# Patient Record
Sex: Female | Born: 1980 | Race: Black or African American | Hispanic: No | Marital: Single | Smoking: Current some day smoker
Health system: Southern US, Community
[De-identification: ages and names within clinical notes are randomized; demographics above are authoritative.]

## PROBLEM LIST (undated history)

## (undated) DIAGNOSIS — F419 Anxiety disorder, unspecified: Secondary | ICD-10-CM

## (undated) DIAGNOSIS — F32A Depression, unspecified: Secondary | ICD-10-CM

## (undated) DIAGNOSIS — K219 Gastro-esophageal reflux disease without esophagitis: Secondary | ICD-10-CM

## (undated) DIAGNOSIS — E041 Nontoxic single thyroid nodule: Secondary | ICD-10-CM

## (undated) HISTORY — DX: Nontoxic single thyroid nodule: E04.1

## (undated) HISTORY — DX: Anxiety disorder, unspecified: F41.9

## (undated) HISTORY — DX: Gastro-esophageal reflux disease without esophagitis: K21.9

## (undated) HISTORY — PX: TUBAL LIGATION: SHX77

## (undated) HISTORY — DX: Depression, unspecified: F32.A

## (undated) HISTORY — PX: ABDOMINAL HYSTERECTOMY: SHX81

---

## 2010-01-20 ENCOUNTER — Emergency Department (HOSPITAL_BASED_OUTPATIENT_CLINIC_OR_DEPARTMENT_OTHER): Admission: EM | Admit: 2010-01-20 | Discharge: 2010-01-20 | Payer: Self-pay | Admitting: Emergency Medicine

## 2010-06-18 ENCOUNTER — Emergency Department (HOSPITAL_BASED_OUTPATIENT_CLINIC_OR_DEPARTMENT_OTHER)
Admission: EM | Admit: 2010-06-18 | Discharge: 2010-06-19 | Payer: Self-pay | Source: Home / Self Care | Admitting: Emergency Medicine

## 2010-09-04 ENCOUNTER — Emergency Department (HOSPITAL_BASED_OUTPATIENT_CLINIC_OR_DEPARTMENT_OTHER)
Admission: EM | Admit: 2010-09-04 | Discharge: 2010-09-04 | Disposition: A | Discharge status: Home / Self Care | Payer: Self-pay | Attending: Emergency Medicine | Admitting: Emergency Medicine

## 2010-09-04 DIAGNOSIS — H5789 Other specified disorders of eye and adnexa: Secondary | ICD-10-CM | POA: Insufficient documentation

## 2010-09-04 DIAGNOSIS — H00019 Hordeolum externum unspecified eye, unspecified eyelid: Secondary | ICD-10-CM | POA: Insufficient documentation

## 2010-11-14 ENCOUNTER — Emergency Department (HOSPITAL_BASED_OUTPATIENT_CLINIC_OR_DEPARTMENT_OTHER)
Admission: EM | Admit: 2010-11-14 | Discharge: 2010-11-14 | Disposition: A | Discharge status: Home / Self Care | Payer: Self-pay | Attending: Emergency Medicine | Admitting: Emergency Medicine

## 2010-11-14 DIAGNOSIS — R221 Localized swelling, mass and lump, neck: Secondary | ICD-10-CM | POA: Insufficient documentation

## 2010-11-14 DIAGNOSIS — R220 Localized swelling, mass and lump, head: Secondary | ICD-10-CM | POA: Insufficient documentation

## 2010-11-14 DIAGNOSIS — R599 Enlarged lymph nodes, unspecified: Secondary | ICD-10-CM | POA: Insufficient documentation

## 2010-11-14 DIAGNOSIS — L089 Local infection of the skin and subcutaneous tissue, unspecified: Secondary | ICD-10-CM | POA: Insufficient documentation

## 2012-04-28 ENCOUNTER — Emergency Department (HOSPITAL_BASED_OUTPATIENT_CLINIC_OR_DEPARTMENT_OTHER)
Admission: EM | Admit: 2012-04-28 | Discharge: 2012-04-28 | Disposition: A | Discharge status: Home / Self Care | Payer: Medicaid Other | Attending: Emergency Medicine | Admitting: Emergency Medicine

## 2012-04-28 ENCOUNTER — Encounter (HOSPITAL_BASED_OUTPATIENT_CLINIC_OR_DEPARTMENT_OTHER): Payer: Self-pay | Admitting: *Deleted

## 2012-04-28 DIAGNOSIS — L259 Unspecified contact dermatitis, unspecified cause: Secondary | ICD-10-CM | POA: Insufficient documentation

## 2012-04-28 MED ORDER — TRIAMCINOLONE ACETONIDE 0.025 % EX OINT: TOPICAL_OINTMENT | Freq: Two times a day (BID) | CUTANEOUS | Status: DC

## 2012-04-28 NOTE — ED Notes (Signed)
Family at bedside., pt requested something to drink, ginger ale given per Dr. Fredderick Phenix.

## 2012-04-28 NOTE — ED Notes (Signed)
Pt has small patches of blisters to left arm, chest and left side face.

## 2012-04-28 NOTE — ED Provider Notes (Signed)
History   This chart was scribed for Rolan Bucco, MD by Thad Ranger, ED Scribe. This patient was seen in room MH11/MH11 and the patient's care was started at 7:38 PM.    CSN: [id number]  Arrival date & time 04/28/12  1755   None     Chief Complaint  Patient presents with  . Rash   The history is provided by the patient. No language interpreter was used.    Melissa Blankenship is a 31 y.o. female who presents to the Emergency Department complaining of constant, localized rash and chestwall onset 4 days ago. There is associated constant itching, and soreness; but she denies them being painful. She also says that her left side of her face felt a little swollen today. Patient reports taking benadryl with some relief. She denies being in contact with anything strange or poison ivy. She has no history of allergies. She denies fever, chills, nausea, and vomiting.    History reviewed. No pertinent past medical history.  History reviewed. No pertinent past surgical history.  History reviewed. No pertinent family history.  History  Substance Use Topics  . Smoking status: Never Smoker   . Smokeless tobacco: Not on file  . Alcohol Use: No   No OB history provided.   Review of Systems  Constitutional: Negative for fever, chills, diaphoresis and fatigue.  HENT: Negative for congestion, rhinorrhea and sneezing.   Eyes: Negative.   Respiratory: Negative for cough, chest tightness and shortness of breath.   Cardiovascular: Negative for chest pain and leg swelling.  Gastrointestinal: Negative for nausea, vomiting, abdominal pain, diarrhea and blood in stool.  Genitourinary: Negative for frequency, hematuria, flank pain and difficulty urinating.  Musculoskeletal: Negative for back pain and arthralgias.  Skin: Positive for color change and rash.  Neurological: Negative for dizziness, speech difficulty, weakness, numbness and headaches.    Allergies  Review of patient's allergies  indicates no known allergies.  Home Medications   Current Outpatient Rx  Name  Route  Sig  Dispense  Refill  . TRIAMCINOLONE ACETONIDE 0.025 % EX OINT   Topical   Apply topically 2 (two) times daily.   30 g   0     BP 106/56  Pulse 72  Temp 98.8 F (37.1 C) (Oral)  Resp 18  Ht 5\' 3"  (1.6 m)  Wt 114 lb (51.71 kg)  BMI 20.19 kg/m2  SpO2 100%  LMP 04/29/2011  Physical Exam  Constitutional: She is oriented to person, place, and time. She appears well-developed and well-nourished.  HENT:  Head: Normocephalic and atraumatic.       No obvious facial/lip/tongue swelling  Eyes: Pupils are equal, round, and reactive to light.  Neck: Normal range of motion. Neck supple.  Cardiovascular: Normal rate, regular rhythm and normal heart sounds.   Pulmonary/Chest: Effort normal and breath sounds normal. No respiratory distress. She has no wheezes. She has no rales. She exhibits no tenderness.  Abdominal: Soft. Bowel sounds are normal. There is no tenderness. There is no rebound and no guarding.  Musculoskeletal: Normal range of motion. She exhibits no edema.  Lymphadenopathy:    She has no cervical adenopathy.  Neurological: She is alert and oriented to person, place, and time.  Skin: Skin is warm and dry. No rash noted.       Small raised erythematous area to her chest wall and her left upper arm its blanching no petechiae or purpura.  No vesicles.     Psychiatric: She has a  normal mood and affect.    ED Course  Procedures (including critical care time)  DIAGNOSTIC STUDIES: Oxygen Saturation is 100% on room air, normal by my interpretation.    COORDINATION OF CARE: 8:56 PM Discussed treatment plan with pt at bedside and pt agreed to plan.  Labs Reviewed - No data to display No results found.   1. Contact dermatitis       MDM  Pt with what appears to be a localized contact dermatitis.  Will give triamcinalone cream.  Continue benadryl as needed.  F/u with her PMD in [place] if symptoms not improving      I personally performed the services described in this documentation, which was scribed in my presence.  The recorded information has been reviewed and considered.    Rolan Bucco, MD 04/28/12 2116

## 2012-04-28 NOTE — ED Notes (Signed)
MD at bedside. 

## 2016-11-06 ENCOUNTER — Emergency Department (HOSPITAL_COMMUNITY)
Admission: EM | Admit: 2016-11-06 | Discharge: 2016-11-06 | Disposition: A | Discharge status: Home / Self Care | Payer: Medicaid Other | Attending: Emergency Medicine | Admitting: Emergency Medicine

## 2016-11-06 ENCOUNTER — Encounter (HOSPITAL_COMMUNITY): Payer: Self-pay | Admitting: Emergency Medicine

## 2016-11-06 ENCOUNTER — Emergency Department (HOSPITAL_COMMUNITY): Payer: Medicaid Other

## 2016-11-06 DIAGNOSIS — Y999 Unspecified external cause status: Secondary | ICD-10-CM | POA: Insufficient documentation

## 2016-11-06 DIAGNOSIS — Y929 Unspecified place or not applicable: Secondary | ICD-10-CM | POA: Diagnosis not present

## 2016-11-06 DIAGNOSIS — M79644 Pain in right finger(s): Secondary | ICD-10-CM

## 2016-11-06 DIAGNOSIS — W231XXA Caught, crushed, jammed, or pinched between stationary objects, initial encounter: Secondary | ICD-10-CM | POA: Insufficient documentation

## 2016-11-06 DIAGNOSIS — Y939 Activity, unspecified: Secondary | ICD-10-CM | POA: Insufficient documentation

## 2016-11-06 DIAGNOSIS — S6991XA Unspecified injury of right wrist, hand and finger(s), initial encounter: Secondary | ICD-10-CM | POA: Diagnosis present

## 2016-11-06 IMAGING — CR DG FINGER MIDDLE 2+V*R*
3 series · 3 of 3 positions shown · non-contrast
Comparison: None.

CLINICAL DATA: 35-year-old female with trauma to the right middle
finger.

EXAM:
RIGHT MIDDLE FINGER 2+V

[x finger pa right]
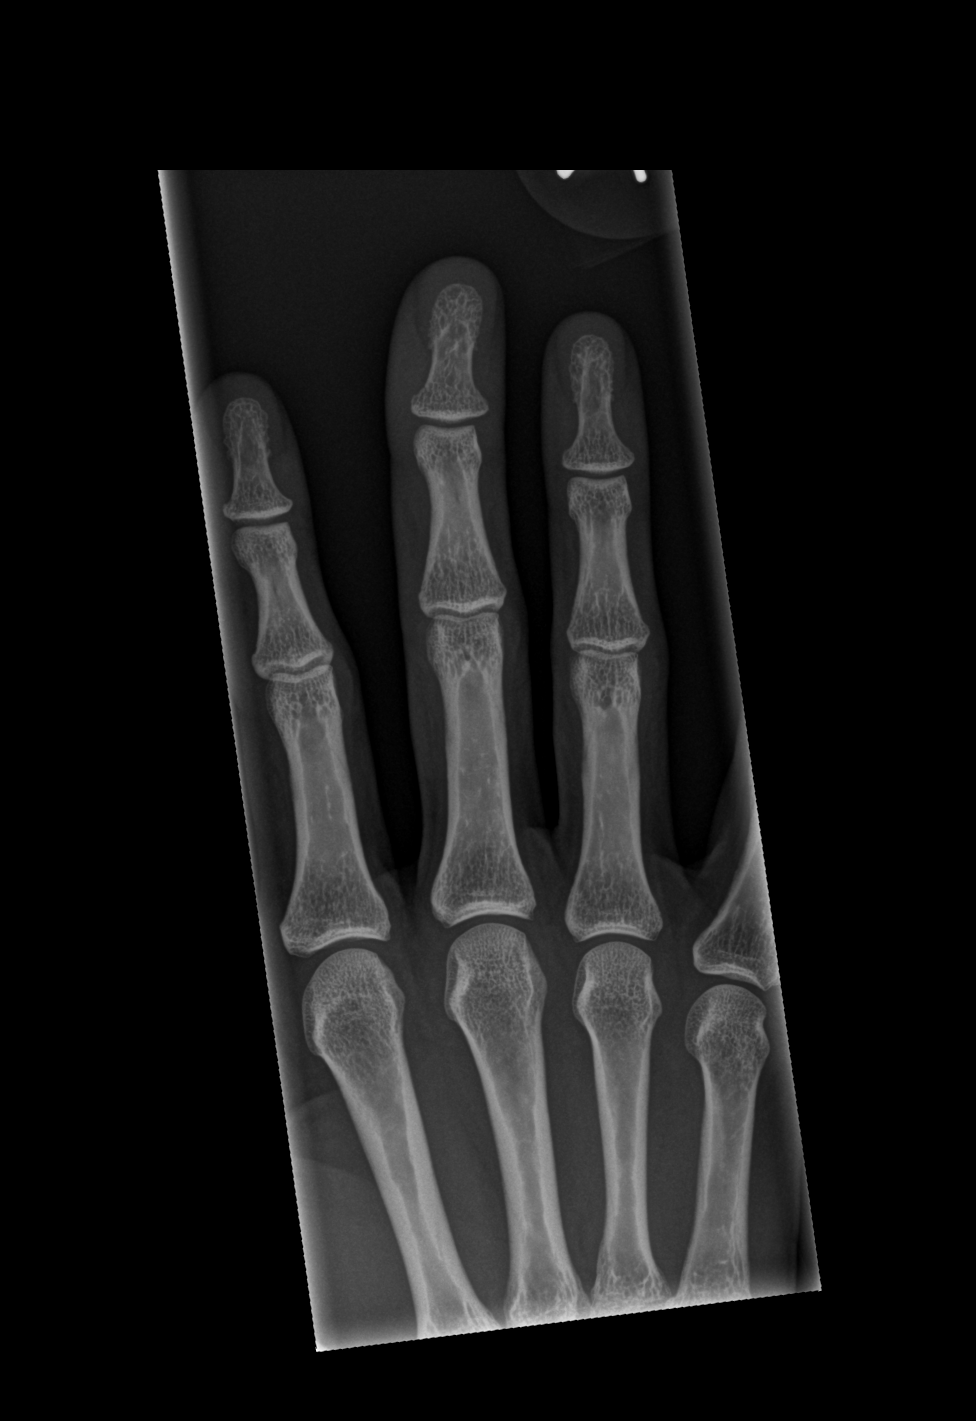

[x finger obl right]
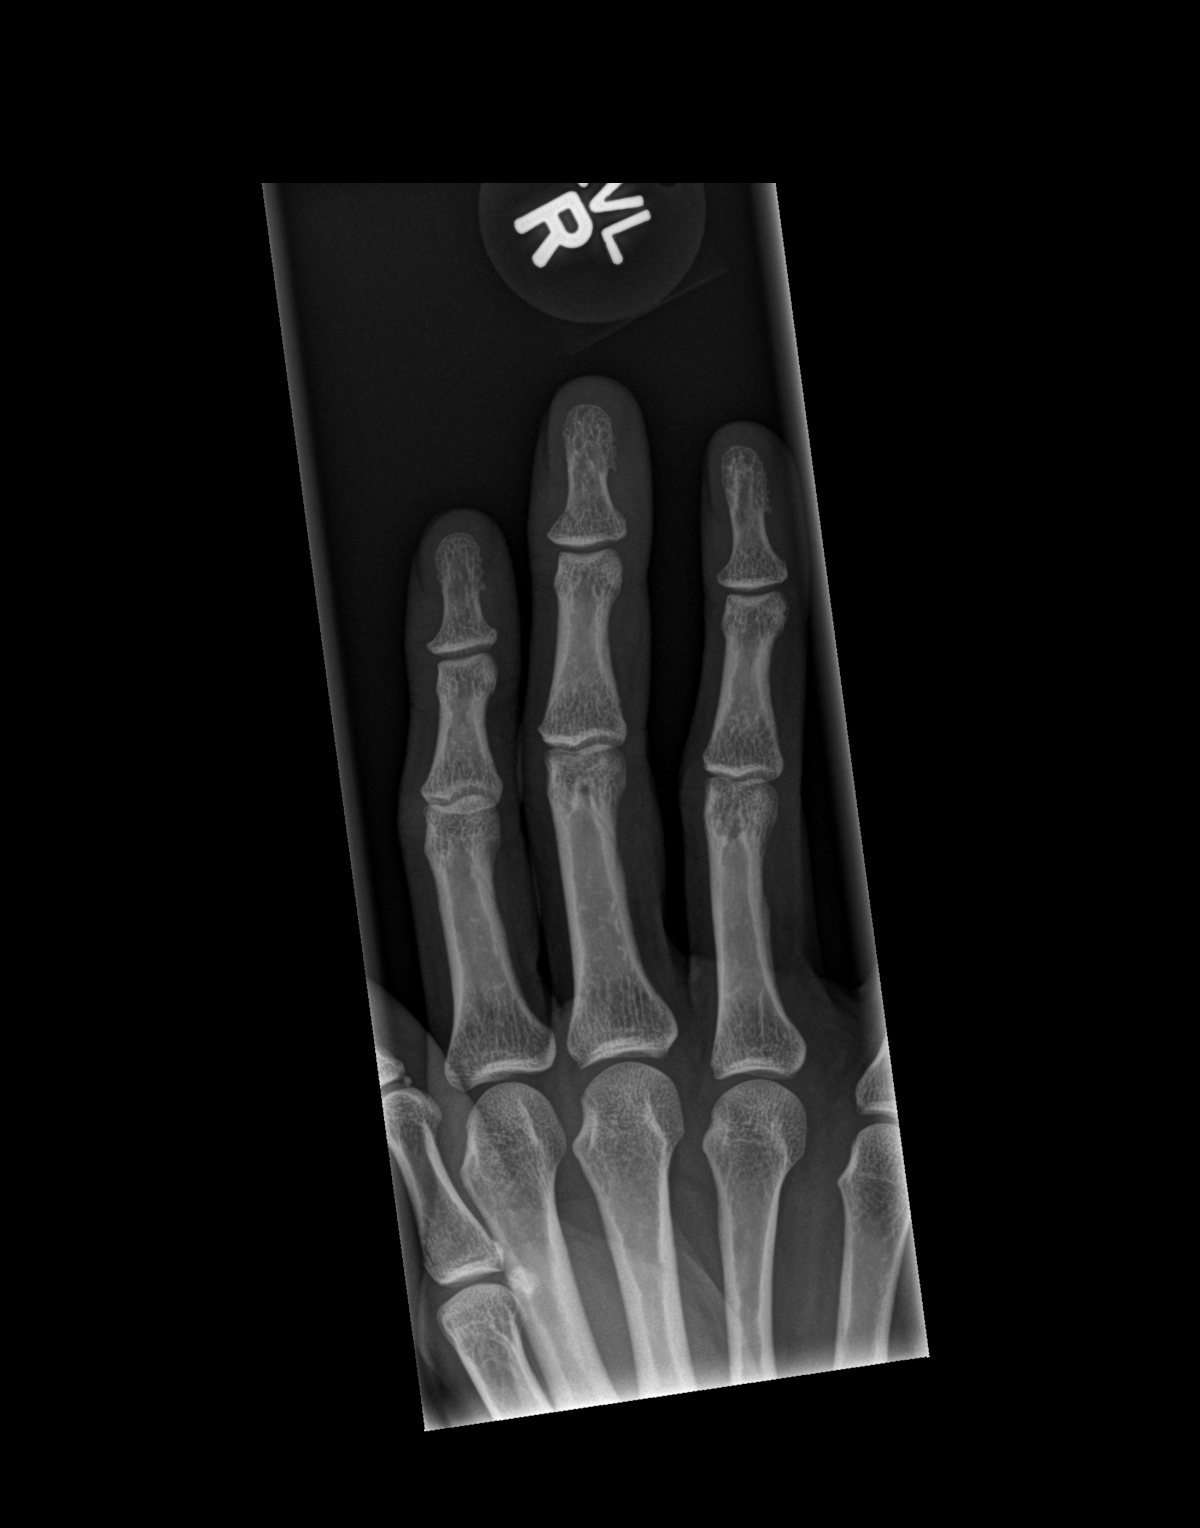

[x finger lat right]
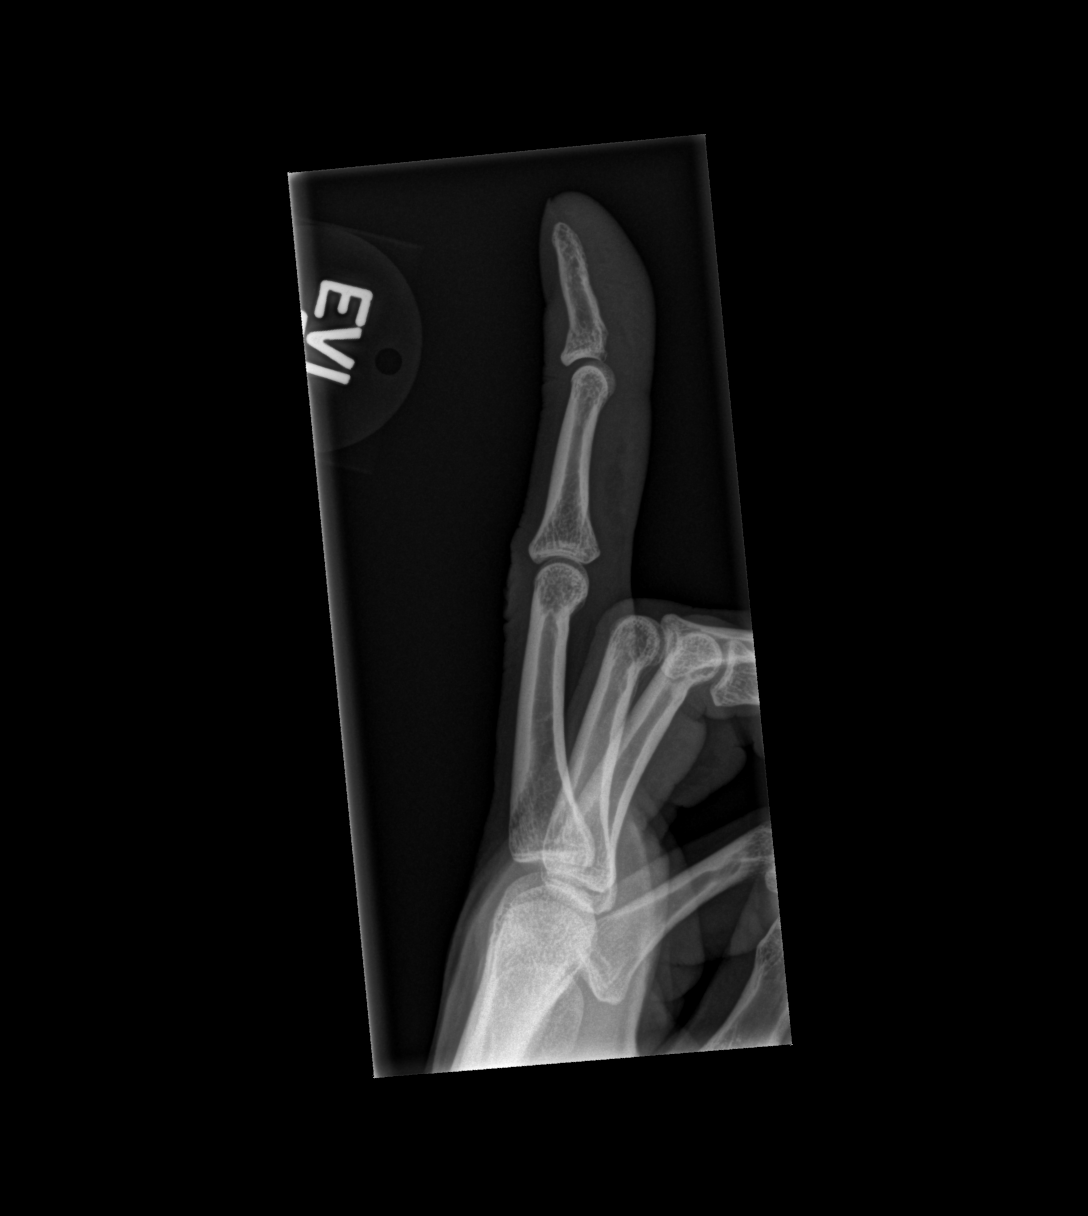

[3 of 3 positions shown; findings below may reference images not displayed]

FINDINGS: There is a linear lucency involving the base of the distal phalanx
of the third digit with extension to the radial cortex. This likely
represents a vascular groove and less likely a nondisplaced
fracture. Clinical correlation is recommended. No definite acute
fracture identified. There is no dislocation. The bones are well
mineralized. No arthritic changes. Mild soft tissue swelling of the
distal digit. No radiopaque foreign object.
IMPRESSION: No definite acute fracture or dislocation. Faint linear lucency at
the base of the distal phalanx of the third digit most likely
represents a vascular groove.

## 2016-11-06 MED ORDER — ACETAMINOPHEN 500 MG PO TABS
1000.0000 mg | ORAL_TABLET | Freq: Once | ORAL | Status: AC
  Administered 2016-11-06: 1000 mg via ORAL
  Filled 2016-11-06: qty 2

## 2016-11-06 NOTE — ED Provider Notes (Signed)
[facility]-EMERGENCY DEPT Provider Note   CSN: [id number] Arrival date & time: 11/06/16  1910  By signing my name below, I, Modena Jansky, attest that this documentation has been prepared under the direction and in the presence of non-physician practitioner, Maxwell Caul, PA-C. Electronically Signed: Modena Jansky, Scribe. 11/06/2016. 8:00 PM.  History   Chief Complaint Chief Complaint  Patient presents with  . Hand Pain   The history is provided by the patient. No language interpreter was used.   HPI Comments: Melissa Blankenship is a 36 y.o. female who presents to the Emergency Department complaining of constant moderate 3rd finger pain that started about 5 hours ago. 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 She iced her finger  with no medications taken PTA. Her gradually worsening pain is exacerbated by any 3rd finger movement. She reports associated redness. Denies any numbness/weakness other complaints at this time.   PCP: Neldon Labella, PA   History reviewed. No pertinent past medical history.  There are no active problems to display for this patient.   Past Surgical History:  Procedure Laterality Date  . ABDOMINAL HYSTERECTOMY    . TUBAL LIGATION      OB History    No data available       Home Medications    Prior to Admission medications   Medication Sig Start Date End Date Taking? Authorizing Provider  triamcinolone (KENALOG) 0.025 % ointment Apply topically 2 (two) times daily. 04/28/12   Rolan Bucco, MD    Family History No family history on file.  Social History Social History  Substance Use Topics  . Smoking status: Never Smoker  . Smokeless tobacco: Never Used  . Alcohol use No     Allergies   Patient has no known allergies.   Review of Systems Review of Systems  Constitutional: Negative for fever.  Musculoskeletal: Positive for arthralgias, joint swelling and myalgias.  Skin: Positive for color  change.     Physical Exam Updated Vital Signs BP (!) 136/93 (BP Location: Left Arm)   Pulse 71   Temp 98 F (36.7 C) (Oral)   Resp 18   Ht 5\' 3"  (1.6 m)   Wt 126 lb (57.2 kg)   LMP 04/29/2011   SpO2 98%   BMI 22.32 kg/m   Physical Exam  Constitutional: She appears well-developed and well-nourished.  Sitting comfortably on examination table  HENT:  Head: Normocephalic and atraumatic.  Eyes: Conjunctivae and EOM are normal. Right eye exhibits no discharge. Left eye exhibits no discharge. No scleral icterus.  Cardiovascular:  +2 radial pulses.   Pulmonary/Chest: Effort normal.  Musculoskeletal:  Full ROM of right wrist. No TTP to right wrist or right MCPs. Right 3rd digit with diffuse TTP with overlying ecchymosis and soft tissue swelling to the distal end. PIP flexion intact fully.  DIP flexion is limited secondary to pain and swelling but she is able to achieve mild flexion when held in isolation.    Neurological: She is alert.  Skin: Skin is warm and dry.  Psychiatric: She has a normal mood and affect. Her speech is normal and behavior is normal.  Nursing note and vitals reviewed.    ED Treatments / Results  DIAGNOSTIC STUDIES: Oxygen Saturation is 98% on RA, normal by my interpretation.    COORDINATION OF CARE: 8:04 PM- Pt advised of plan for treatment and pt agrees.  Labs (all labs ordered are listed, but only abnormal results are  displayed) Labs Reviewed - No data to display  EKG  EKG Interpretation None       Radiology Dg Finger Middle Right  Result Date: 11/06/2016 CLINICAL DATA:  36 year old female with trauma to the right middle finger. EXAM: RIGHT MIDDLE FINGER 2+V COMPARISON:  None. FINDINGS: There is a linear lucency involving the base of the distal phalanx of the third digit with extension to the radial cortex. This likely represents a vascular groove and less likely a nondisplaced fracture. Clinical correlation is recommended. No definite acute  fracture identified. There is no dislocation. The bones are well mineralized. No arthritic changes. Mild soft tissue swelling of the distal digit. No radiopaque foreign object. IMPRESSION: No definite acute fracture or dislocation. Faint linear lucency at the base of the distal phalanx of the third digit most likely represents a vascular groove. Electronically Signed   By: Elgie CollardArash  Radparvar M.D.   On: 11/06/2016 19:53    Procedures Procedures (including critical care time)  Medications Ordered in ED Medications  acetaminophen (TYLENOL) tablet 1,000 mg (1,000 mg Oral Given 11/06/16 2013)     Initial Impression / Assessment and Plan / ED Course  I have reviewed the triage vital signs and the nursing notes.  Pertinent labs & imaging results that were available during my care of the patient were reviewed by me and considered in my medical decision making (see chart for details).     36 year old female who presents with right third finger pain that began after a injury this afternoon. Patient is neurovascularly intact. She does have some diffuse ecchymosis and swelling to the distal end. DIP flexion limited secondary to pain and swelling but is able to have some flexion when held in isolation. X-rays ordered at triage. Analgesics given in the department.  Patient X-Ray negative for obvious fracture or dislocation. They do note the presence of a linear lucency to the distal end of the finger. Pt advised to follow up with orthopedics if symptoms persist for possibility of missed fracture diagnosis. Patient given splint while in ED, conservative therapy recommended and discussed. Instructed patient to follow-up with referred hand in 2 days if no improvement in symptoms. Return precautions discussed. Patient expresses understanding and agreement to plan.    Final Clinical Impressions(s) / ED Diagnoses   Final diagnoses:  Finger pain, right    New Prescriptions Discharge Medication List as of  11/06/2016  8:09 PM    I personally performed the services described in this documentation, which was scribed in my presence. The recorded information has been reviewed and is accurate.     Maxwell CaulLayden, Lindsey A, PA-C 11/06/16 2100    Arby BarrettePfeiffer, Marcy, MD 11/10/16 [phone]

## 2016-11-06 NOTE — ED Notes (Signed)
Ortho en route.  

## 2016-11-06 NOTE — ED Triage Notes (Signed)
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  Some swelling and color changes noted to first joint on affected finger.  Pt states she is unable to bend it.

## 2016-11-06 NOTE — Discharge Instructions (Signed)
Follow-up with her primary care doctor in the next 24-48 hours for further evaluation.  Follow-up with referred him Dr. in the next 2-4 days if no improvement in symptoms.  Take Tylenol or ibuprofen as needed for pain.  Use the splint for support and stabilization.  Return the emergency Department for any worsening pain, worsening swelling, redness or swelling that extends down her fingertips or hand, fever, worsening concerns or symptoms.

## 2016-11-06 NOTE — Progress Notes (Signed)
Orthopedic Tech Progress Note Patient Details:  Berneice GandyCherise N Melissa Blankenship 03/02/1981 [id number]  Ortho Devices Type of Ortho Device: Finger splint Ortho Device/Splint Location: Rt Middle finger Ortho Device/Splint Interventions: Application   Clois Dupesvery S Weslynn Ke 11/06/2016, 8:22 PM

## 2017-04-06 ENCOUNTER — Other Ambulatory Visit: Payer: Self-pay | Admitting: Physician Assistant

## 2017-04-06 DIAGNOSIS — Z139 Encounter for screening, unspecified: Secondary | ICD-10-CM

## 2017-05-08 ENCOUNTER — Ambulatory Visit
Admission: RE | Admit: 2017-05-08 | Discharge: 2017-05-08 | Disposition: A | Discharge status: Home / Self Care | Payer: Medicaid Other | Source: Ambulatory Visit | Attending: Physician Assistant | Admitting: Physician Assistant

## 2017-05-08 DIAGNOSIS — Z139 Encounter for screening, unspecified: Secondary | ICD-10-CM

## 2017-05-09 ENCOUNTER — Other Ambulatory Visit: Payer: Self-pay | Admitting: Physician Assistant

## 2017-05-09 DIAGNOSIS — N63 Unspecified lump in unspecified breast: Secondary | ICD-10-CM

## 2017-06-12 HISTORY — PX: INCISION AND DRAINAGE / EXCISION THYROGLOSSAL CYST: SUR667

## 2018-12-09 ENCOUNTER — Ambulatory Visit (INDEPENDENT_AMBULATORY_CARE_PROVIDER_SITE_OTHER): Payer: No Typology Code available for payment source | Admitting: Internal Medicine

## 2018-12-09 ENCOUNTER — Encounter: Payer: Self-pay | Admitting: Internal Medicine

## 2018-12-09 ENCOUNTER — Other Ambulatory Visit: Payer: Self-pay

## 2018-12-09 VITALS — BP 119/78 | HR 90 | Temp 98.1°F | Wt 131.5 lb

## 2018-12-09 DIAGNOSIS — E041 Nontoxic single thyroid nodule: Secondary | ICD-10-CM

## 2018-12-09 DIAGNOSIS — K259 Gastric ulcer, unspecified as acute or chronic, without hemorrhage or perforation: Secondary | ICD-10-CM

## 2018-12-09 DIAGNOSIS — M79673 Pain in unspecified foot: Secondary | ICD-10-CM

## 2018-12-09 DIAGNOSIS — K219 Gastro-esophageal reflux disease without esophagitis: Secondary | ICD-10-CM

## 2018-12-09 MED ORDER — OMEPRAZOLE 40 MG PO CPDR: 40.0000 mg | DELAYED_RELEASE_CAPSULE | Freq: Every day | ORAL | 0 refills | Status: DC

## 2018-12-09 MED FILL — OMEPRAZOLE DR 40 MG CAPSULE: 40 | 30 days supply | Qty: 30 | Fill #0

## 2018-12-09 NOTE — Progress Notes (Signed)
CC: dysphagia   HPI:  [name] is a 38 y.o. female with history thyroid nodules and gastric ulcers who presents to establish care and with an acute complaint of intermittent dysphagia for the last 4 months.  This is similar to her symptoms a year ago. She underwent evaluation by ENT and endocrinology. 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 She was being followed by endocrinology for two thyroid nodules. Her thyroid function testing at that time was normal, so they recommended repeat ultrasound in 6-12 months to monitor nodules. Unfortunately, she was lost to follow-up due to insurance issues.  Regarding the dysphagia, it seems to happen intermittently with solids or liquids. She describes it as the sensation of something getting stuck for a few seconds before going all the way down. Denies odynophagia or shortness of breath. Endorses fatigue, intermittent palpitations, hair loss. Weight tends to fluctuate, but denies significant weight loss since symptom onset.   Patient also reports symptoms of acid reflux, nausea, upper abdominal burning with eating. She has been seen by GI in the past and underwent upper and lower endoscopic evaluation. Diagnosed with gastric ulcers and initiated on Omeprazole with plan to repeat upper endoscopy for surveillance. However, she was unable to follow-up due to similar insurance and financial difficulties. She has not been able to take the Omeprazole since February.     Past Medical History:  Diagnosis Date  . GERD (gastroesophageal reflux disease)   . Thyroid nodule    Family History  Problem Relation Age of Onset  . Lung cancer Mother   . Diabetes Mellitus II Mother   . Hypertension Mother   . Diabetes type II Father   . Hypertension Father   . CAD Maternal Grandfather    Social: former smoker, denies EtOH or illicit drug use.  Works for [employer].   Review of Systems: Review of Systems  All other systems reviewed and are negative.   Physical Exam:  Vitals:   12/09/18 1014  BP: 119/78  Pulse: 90  Temp: 98.1 F (36.7 C)  TempSrc: Oral  SpO2: 100%  Weight: 131 lb 8 oz (59.6 kg)   Physical Exam Constitutional:      General: She is not in acute distress.    Appearance: Normal appearance.  Eyes:     Conjunctiva/sclera: Conjunctivae normal.  Neck:     Thyroid: Thyromegaly present.     Comments: Palpable nodule on right.  Cardiovascular:     Rate and Rhythm: Normal rate and regular rhythm.  Pulmonary:     Effort: Pulmonary effort is normal.     Breath sounds: Normal breath sounds.  Abdominal:     General: Bowel sounds are normal.     Palpations: Abdomen is soft.     Tenderness: There is no abdominal tenderness.  Musculoskeletal: Normal range of motion.     Right lower leg: No edema.     Left lower leg: No edema.  Skin:    General: Skin is warm and dry.  Neurological:     General: No focal deficit present.     Mental Status: She is alert and oriented to person, place, and time.  Psychiatric:        Mood and Affect: Mood normal.        Behavior: Behavior normal.     Assessment & Plan:   See Encounters Tab for problem based charting.  Patient  discussed with Dr. Rebeca Alert

## 2018-12-09 NOTE — Patient Instructions (Signed)
Melissa Blankenship, It was a pleasure meeting you! We are happy to have you establishing in our clinic.  Today we discussed:  1. Your difficulty swallowing: We want to evaluate if your symptoms are due to the thyroid nodules you have. I am ordering some labs and an ultrasound. I'm also placing a referral to endocrinology to re-establish care with them.   2. Acid reflux and ulcers: I'm starting you back on Omeprazole to take daily. We'll see how your symptom are doing in about 4 weeks and will consider GI referral at that time.   3. Your foot pain: likely inflammation of bones and tendons of your big toe. Continue taking Aleve as needed. Try to find a ball that you can roll out the bottom of your feet with at night when you get off from work. Icing and elevating when you're off work should also help.   We'll plan to see you back in about 4 weeks, but please call sooner if you think your symptoms of difficulty swallowing are worsening or you notice any difficulty breathing.   Take care! Dr. Koleen Distance

## 2018-12-10 LAB — BMP8+ANION GAP
Anion Gap: 15 mmol/L (ref 10.0–18.0)
BUN/Creatinine Ratio: 5 — ABNORMAL LOW (ref 9–23)
BUN: 4 mg/dL — ABNORMAL LOW (ref 6–20)
CO2: 20 mmol/L (ref 20–29)
Calcium: 9.2 mg/dL (ref 8.7–10.2)
Chloride: 106 mmol/L (ref 96–106)
Creatinine, Ser: 0.76 mg/dL (ref 0.57–1.00)
GFR calc Af Amer: 114 mL/min/{1.73_m2} (ref >59)
GFR calc non Af Amer: 99 mL/min/{1.73_m2} (ref >59)
Glucose: 82 mg/dL (ref 65–99)
Potassium: 4 mmol/L (ref 3.5–5.2)
Sodium: 141 mmol/L (ref 134–144)

## 2018-12-10 LAB — T4, FREE: Free T4: 1.14 ng/dL (ref 0.82–1.77)

## 2018-12-10 LAB — TSH: TSH: 0.466 u[IU]/mL (ref 0.450–4.500)

## 2018-12-10 LAB — T3: T3, Total: 164 ng/dL (ref 71–180)

## 2018-12-12 ENCOUNTER — Encounter: Payer: Self-pay | Admitting: Internal Medicine

## 2018-12-12 DIAGNOSIS — E041 Nontoxic single thyroid nodule: Secondary | ICD-10-CM | POA: Insufficient documentation

## 2018-12-12 DIAGNOSIS — K219 Gastro-esophageal reflux disease without esophagitis: Secondary | ICD-10-CM | POA: Insufficient documentation

## 2018-12-12 NOTE — Assessment & Plan Note (Signed)
Patient with known history of bilateral thyroid nodules, R>L. Last seen by endocrinology in 10/2017 at which time they recommended surveillance with ultrasound every 6-12 months. Thyroid function tests at that time were normal.  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  Repeat thyroid function testing today is normal. Will order ultrasound of the neck and place referral to endocrinology.

## 2018-12-12 NOTE — Assessment & Plan Note (Signed)
Patient endorses history of nausea, acid reflux, and upper abdominal burning with eating. States she has been evaluated by GI and was diagnosed with gastric ulcers. She was placed on Omeprazole with plan for repeat EGD for surveillance after several weeks of treatment. Will attempt to obtain these outside records, as they are not in our system. She has not been on Omeprazole since February. Will restart her on Omeprazole 40 mg daily and evaluate for symptom improvement at follow-up visit in 4 weeks. May need to be referred to GI if she does not respond to therapy.

## 2018-12-12 NOTE — Progress Notes (Signed)
Internal Medicine Clinic Attending  Case discussed with Dr. Bloomfield at the time of the visit.  We reviewed the resident's history and exam and pertinent patient test results.  I agree with the assessment, diagnosis, and plan of care documented in the resident's note.  Alexander Raines, M.D., Ph.D.  

## 2018-12-18 ENCOUNTER — Other Ambulatory Visit: Payer: Self-pay

## 2018-12-18 ENCOUNTER — Ambulatory Visit (HOSPITAL_COMMUNITY)
Admission: RE | Admit: 2018-12-18 | Discharge: 2018-12-18 | Disposition: A | Discharge status: Home / Self Care | Payer: No Typology Code available for payment source | Source: Ambulatory Visit | Attending: Internal Medicine | Admitting: Internal Medicine

## 2018-12-18 ENCOUNTER — Telehealth: Payer: Self-pay | Admitting: *Deleted

## 2018-12-18 DIAGNOSIS — E041 Nontoxic single thyroid nodule: Secondary | ICD-10-CM | POA: Insufficient documentation

## 2018-12-18 IMAGING — US SOFT TISSUE ULTRASOUND HEAD/NECK
2 series · 13 of 25 positions shown · non-contrast
Comparison: None.

CLINICAL DATA: Palpable abnormality. Palpable bilateral thyroid
nodules, right greater than left. History of dysphagia. History of
thyroglossal duct cyst removed in [9K].

EXAM:
THYROID ULTRASOUND
TECHNIQUE: Ultrasound examination of the thyroid gland and adjacent soft
tissues was performed.

[Series 1: soft tissue ultrasound head/neck · 12 of 41 slices shown (1 of 2)]
[im 1/41]
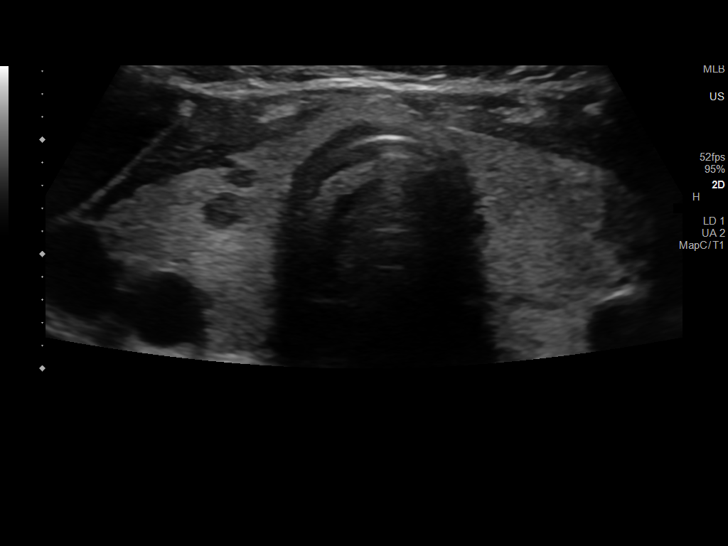
[im 4/41]
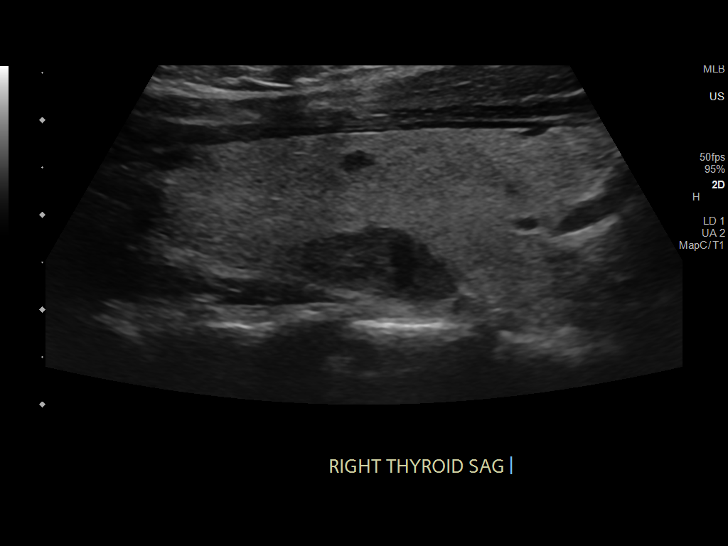
[im 7/41]
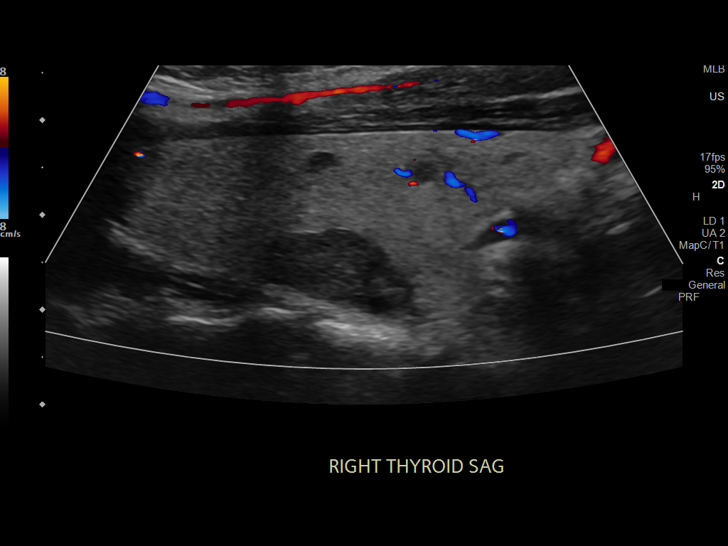
[im 11/41]
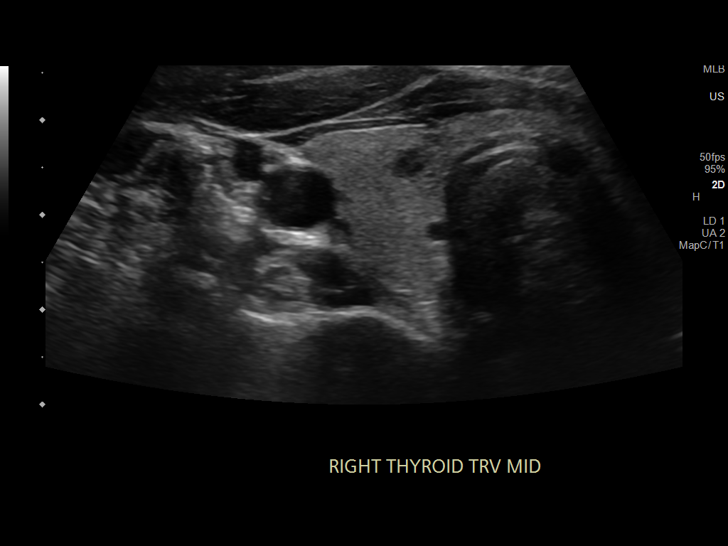
[im 14/41]
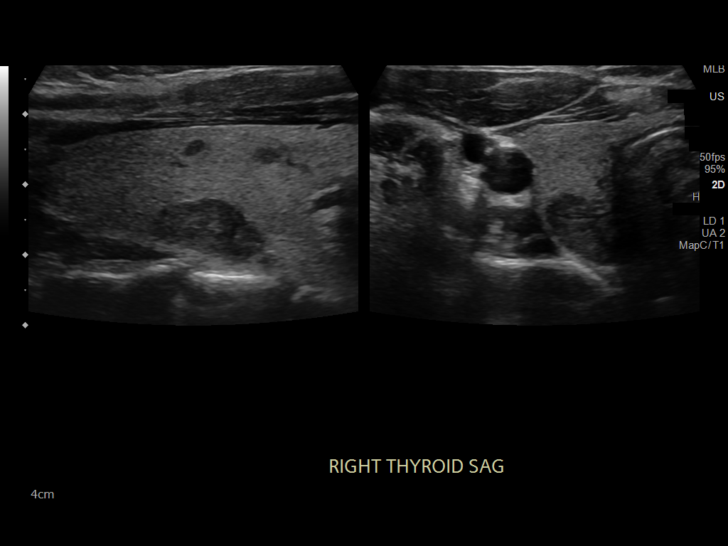
[im 18/41]
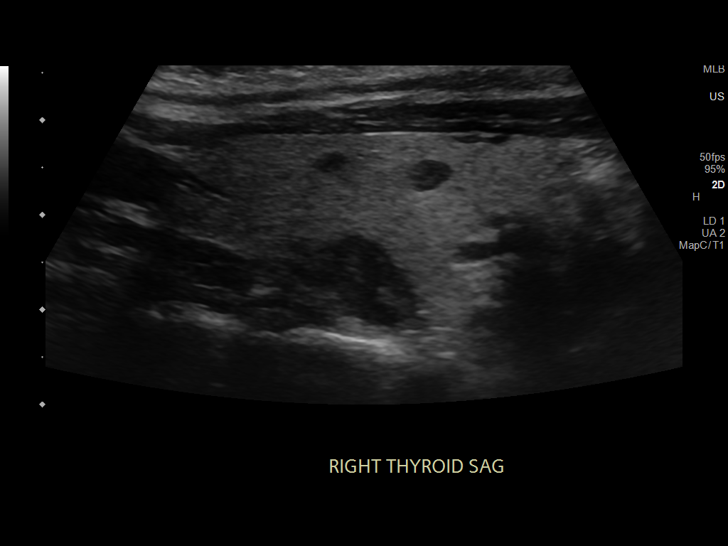
[im 21/41]
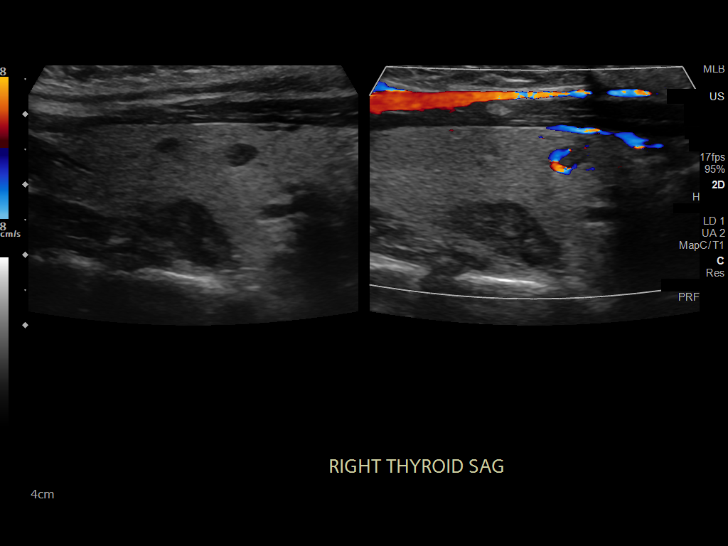
[im 25/41]
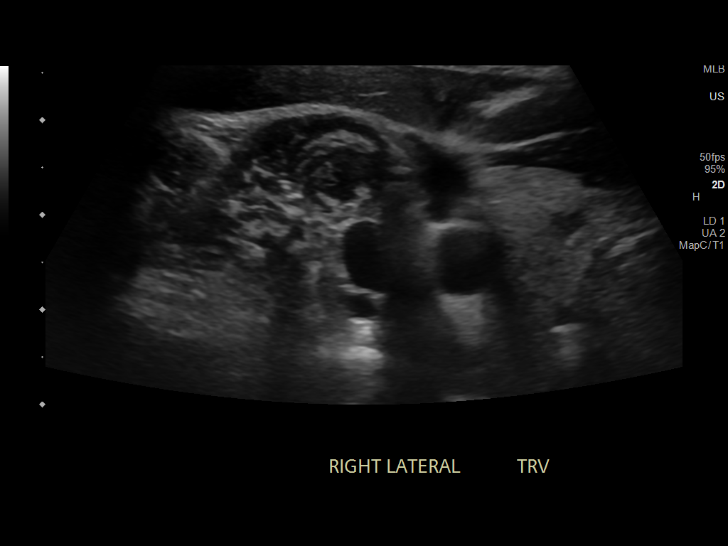
[im 28/41]
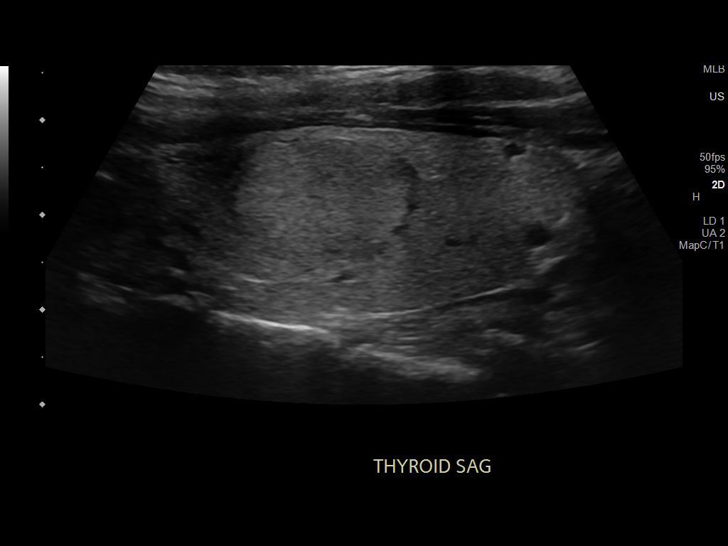
[im 32/41]
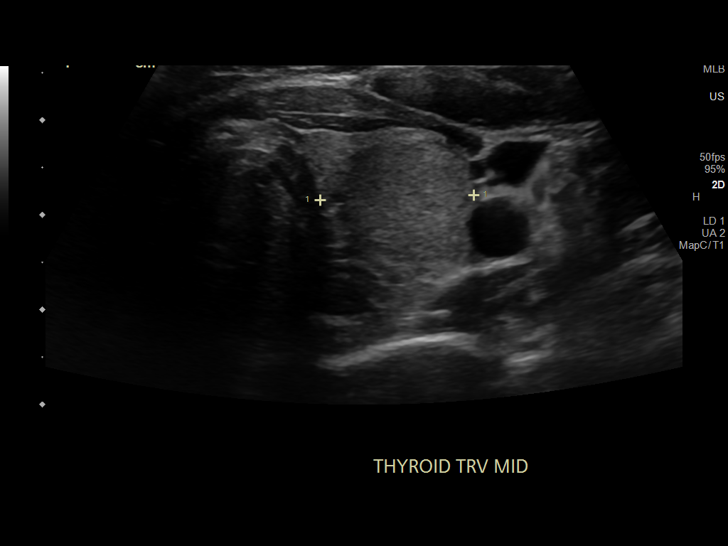
[im 35/41]
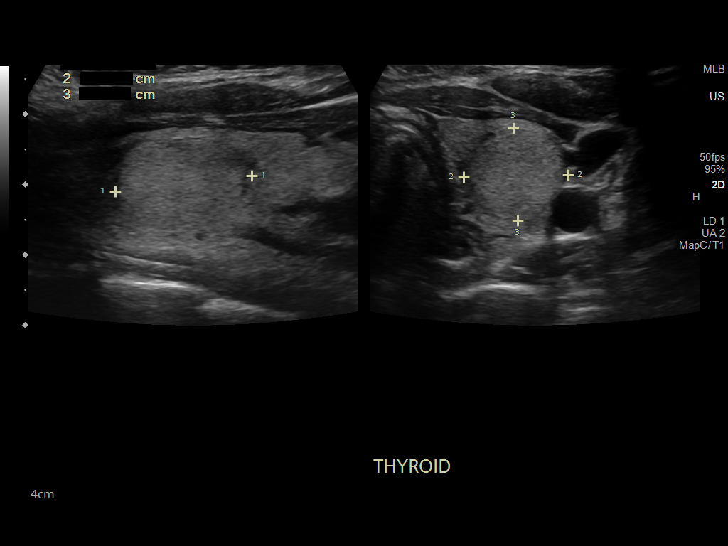
[im 39/41]
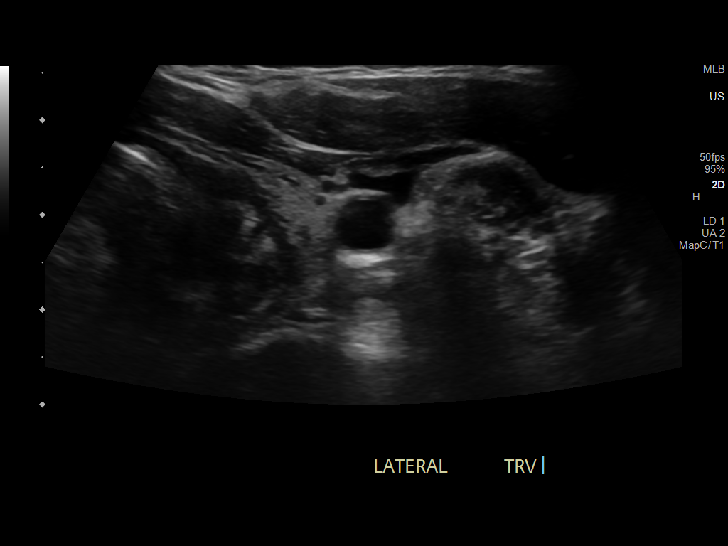

[Series 2: soft tissue ultrasound head/neck · 1 of 2 slices shown (2 of 2)]
[im 1/2]
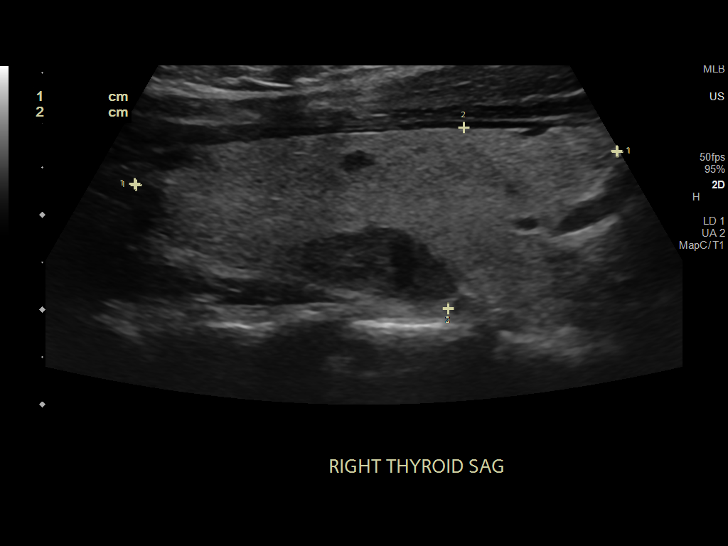

[13 of 25 positions shown; findings below may reference images not displayed]

FINDINGS: Parenchymal Echotexture: Normal

Isthmus: Normal in size measures 0.3 cm in diameter

Right lobe: Normal in size measuring 5.1 x 1.9 x 1.4 cm

Left lobe: Normal in size measuring 4.7 x 2.0 x

_________________________________________________________

Estimated total number of nodules >/= 1 cm: 2

Number of spongiform nodules >/=  2 cm not described below (TR1): 0

Number of mixed cystic and solid nodules >/= 1.5 cm not described
below (TR2): 0

_________________________________________________________

Nodule # 1:

Location: Right; Mid

Maximum size: 1.7 cm; Other 2 dimensions: 1.2 x 0.7 cm

Composition: solid/almost completely solid (2)

Echogenicity: hypoechoic (2)

Shape: taller-than-wide (3)

Margins: smooth (0)

Echogenic foci: none (0)

ACR TI-RADS total points: 7.

ACR TI-RADS risk category: TR5 (>/= 7 points).

ACR TI-RADS recommendations:

**Given size (>/= 1.0 cm) and appearance, fine needle aspiration of
this highly suspicious nodule should be considered based on TI-RADS
criteria.

_________________________________________________________

There is a punctate (approximately 0.5 cm) nodule with the mid,
medial aspect right lobe of the thyroid (labeled 2), which does not
meet imaging criteria to recommend percutaneous sampling or
continued dedicated follow-up.

_________________________________________________________

Nodule # 3:

Location: Left; Mid

Maximum size: 2.0 cm; Other 2 dimensions: 1.5 x 1.3 cm

Composition: solid/almost completely solid (2)

Echogenicity: isoechoic (1)

Shape: not taller-than-wide (0)

Margins: smooth (0)

Echogenic foci: none (0)

ACR TI-RADS total points: 3.

ACR TI-RADS risk category: TR3 (3 points).

ACR TI-RADS recommendations:

*Given size (>/= 1.5 - 2.4 cm) and appearance, a follow-up
ultrasound in 1 year should be considered based on TI-RADS criteria.

________________________________________________________
IMPRESSION: 1. Findings suggestive of multinodular goiter.
2. Nodule #1 meets imaging criteria to recommend percutaneous
sampling as clinically indicated.
3. Nodule #3 meets imaging criteria to recommend a year follow-up.

The above is in keeping with the ACR TI-RADS recommendations - [HOSPITAL] [9K];[DATE].

## 2018-12-18 NOTE — Telephone Encounter (Signed)
SPOKE WITH PATIENT REGARDING HER INSURANCE AND REFERRAL. PATIENT INSTRUCTED TO CALL HER INSURANCE TO LET THEM KNOW THAT HER DOCTOR HAS MADE A REFERRAL TO ENDOCRINOLOGY. [facility]- [phone]

## 2019-01-03 ENCOUNTER — Ambulatory Visit: Payer: No Typology Code available for payment source | Admitting: Internal Medicine

## 2019-01-03 ENCOUNTER — Other Ambulatory Visit: Payer: Self-pay

## 2019-01-03 ENCOUNTER — Encounter: Payer: Self-pay | Admitting: Internal Medicine

## 2019-01-03 VITALS — BP 102/72 | HR 74 | Temp 98.8°F | Ht 63.62 in | Wt 129.0 lb

## 2019-01-03 DIAGNOSIS — E041 Nontoxic single thyroid nodule: Secondary | ICD-10-CM

## 2019-01-03 NOTE — Patient Instructions (Signed)
-   We will set you up for a thyroid biopsy at [facility]   - Please contact us in 2 weeks, if you don't hear about an appointment

## 2019-01-03 NOTE — Progress Notes (Signed)
Name: Melissa Blankenship  MRN/ DOB: [id number], 12/07/1980    Age/ Sex: 38 y.o., female    PCP: Elige Radonhristian, Rylee, MD   Reason for Endocrinology Evaluation: MNG     Date of Initial Endocrinology Evaluation: 01/03/2019     HPI: Ms. Melissa GandyCherise N Hasten is a 38 y.o. female with past medical history of MNG and peptic ulcer disease. The patient presented for initial endocrinology clinic visit on 01/03/2019 for consultative assistance with her MNG.   Pt was diagnosed with MNG many years ago. In 07/2017 had removal of thyroglossal cysts in 07/2017 due to c/o sob and dysphagia.   Pt started having similar symptoms a few months ago with sob when laying down, dysphagia and neck discomfort and is attributing her symptoms to MNG.    Has shortness of breath mostly at work as well     Hair loss and fluctuating diarrhea with constipation.     HISTORY:  Past Medical History:  Past Medical History:  Diagnosis Date  . GERD (gastroesophageal reflux disease)   . Thyroid nodule    Past Surgical History:  Past Surgical History:  Procedure Laterality Date  . ABDOMINAL HYSTERECTOMY    . INCISION AND DRAINAGE / EXCISION THYROGLOSSAL CYST  2019   path report revealed hyperplastic thyroid tissue, non-malignant   . TUBAL LIGATION        Social History:  reports that she has never smoked. She has never used smokeless tobacco. She reports that she does not drink alcohol or use drugs.  Family History: family history includes CAD in her maternal grandfather; Diabetes Mellitus II in her mother; Diabetes type II in her father; Hypertension in her father and mother; Lung cancer in her mother.   HOME MEDICATIONS: Allergies as of 01/03/2019   No Known Allergies     Medication List       Accurate as of January 03, 2019  2:19 PM. If you have any questions, ask your nurse or doctor.        omeprazole 40 MG capsule Commonly known as: PRILOSEC Take 1 capsule (40 mg total) by mouth daily for 30 days.    triamcinolone 0.025 % ointment Commonly known as: KENALOG Apply topically 2 (two) times daily.         REVIEW OF SYSTEMS: A comprehensive ROS was conducted with the patient and is negative except as per HPI and below:  Review of Systems  Constitutional: Negative for chills and fever.  HENT: Negative for congestion and sore throat.   Eyes: Negative for blurred vision and pain.  Respiratory: Positive for shortness of breath. Negative for cough.        At work   Gastrointestinal: Positive for constipation, diarrhea and heartburn.  Genitourinary: Negative for frequency.  Skin: Negative.   Neurological: Negative for tingling and tremors.  Endo/Heme/Allergies: Negative for polydipsia.  Psychiatric/Behavioral: Negative for depression. The patient is not nervous/anxious.        OBJECTIVE:  VS: BP 102/72 (BP Location: Left Arm, Patient Position: Sitting, Cuff Size: Normal)   Pulse 74   Temp 98.8 F (37.1 C)   Ht 5' 3.62" (1.616 m)   Wt 129 lb (58.5 kg)   LMP 04/29/2011   SpO2 99%   BMI 22.41 kg/m    Wt Readings from Last 3 Encounters:  01/03/19 129 lb (58.5 kg)  12/09/18 131 lb 8 oz (59.6 kg)  11/06/16 126 lb (57.2 kg)     EXAM: General: Pt appears well and is  in NAD  Hydration: Well-hydrated with moist mucous membranes and good skin turgor  Eyes: External eye exam normal without stare, lid lag or exophthalmos.  EOM intact.   Ears, Nose, Throat: Hearing: Grossly intact bilaterally Dental: Good dentition  Throat: Clear without mass, erythema or exudate  Neck: General: Supple without adenopathy. Thyroid: Thyroid size normal.  No goiter or nodules appreciated. No thyroid bruit.  Lungs: Clear with good BS bilat with no rales, rhonchi, or wheezes  Heart: Auscultation: RRR.  Abdomen: Normoactive bowel sounds, soft, nontender, without masses or organomegaly palpable  Extremities:  BL LE: No pretibial edema normal ROM and strength.  Skin: Hair: Texture and amount normal  with gender appropriate distribution Skin Inspection: No rashes. Skin Palpation: Skin temperature, texture, and thickness normal to palpation  Neuro: Cranial nerves: II - XII grossly intact  Motor: Normal strength throughout DTRs: 2+ and symmetric in UE without delay in relaxation phase  Mental Status: Judgment, insight: Intact Orientation: Oriented to time, place, and person Mood and affect: No depression, anxiety, or agitation     DATA REVIEWED: Results for Melissa GandyHUBBARD, Melissa N (MRN [id number]) as of 01/03/2019 13:08  Ref. Range 12/09/2018 11:09  TSH Latest Ref Range: 0.450 - 4.500 uIU/mL 0.466  Triiodothyronine (T3) Latest Ref Range: 71 - 180 ng/dL 409164  W1,XBJY(NWGNFAT4,Free(Direct) Latest Ref Range: 0.82 - 1.77 ng/dL 2.131.14    Thyroid Ultrasound 12/18/2018  Nodule # 1:  Location: Right; Mid  Maximum size: 1.7 cm; Other 2 dimensions: 1.2 x 0.7 cm  Composition: solid/almost completely solid (2)  Echogenicity: hypoechoic (2)  Shape: taller-than-wide (3)  Margins: smooth (0)  Echogenic foci: none (0)  ACR TI-RADS total points: 7.  ACR TI-RADS risk category: TR5 (>/= 7 points).  ACR TI-RADS recommendations:  **Given size (>/= 1.0 cm) and appearance, fine needle aspiration of this highly suspicious nodule should be considered based on TI-RADS criteria.  _________________________________________________________  There is a punctate (approximately 0.5 cm) nodule with the mid, medial aspect right lobe of the thyroid (labeled 2), which does not meet imaging criteria to recommend percutaneous sampling or continued dedicated follow-up.  _________________________________________________________  Nodule # 3:  Location: Left; Mid  Maximum size: 2.0 cm; Other 2 dimensions: 1.5 x 1.3 cm  Composition: solid/almost completely solid (2)  Echogenicity: isoechoic (1)  Shape: not taller-than-wide (0)  Margins: smooth (0)  Echogenic foci: none (0)  ACR TI-RADS  total points: 3.  ACR TI-RADS risk category: TR3 (3 points).  ACR TI-RADS recommendations:  *Given size (>/= 1.5 - 2.4 cm) and appearance, a follow-up ultrasound in 1 year should be considered based on TI-RADS criteria.  ASSESSMENT/PLAN/RECOMMENDATIONS:   1. Multinodular Goiter:   - Pt with multiple non-Specific symptoms that are NOT attributed to her thyroid - She is biochemically euthyroid - I don't believe her local neck symptoms are related to her thyroid nodules, they are not large enough to cause any local pressure, I do believe her symptoms may be related to PUD and I would recommend optimizing this through her PCP but she states, she doesn;t like to take medicine.  - I explained to her that if her MNG is believed to be responsible for her current local neck symptoms then the only option we have is thyroidectomy and she will have to be on LT-4 replacement for life.  - Pt in agreement to proceed with FNA of the right mid-lobe nodule.  - Will repeat thyroid ultrasound in 1 year.   F/u in 1 yr  Signed electronically  by: Mack Guise, MD  [facility]  [facility] [address] [place], [place] [address] Phone: [phone] FAX: [phone]   CC: Mitzi Hansen, MD [address] [place] [address] Phone: [phone] Fax: [phone]   Return to Endocrinology clinic as below: No future appointments.

## 2019-01-09 ENCOUNTER — Other Ambulatory Visit: Payer: Self-pay

## 2019-01-09 ENCOUNTER — Encounter: Payer: No Typology Code available for payment source | Admitting: Internal Medicine

## 2019-01-09 ENCOUNTER — Encounter: Payer: Self-pay | Admitting: Internal Medicine

## 2019-01-09 ENCOUNTER — Ambulatory Visit (INDEPENDENT_AMBULATORY_CARE_PROVIDER_SITE_OTHER): Payer: No Typology Code available for payment source | Admitting: Internal Medicine

## 2019-01-09 ENCOUNTER — Encounter (INDEPENDENT_AMBULATORY_CARE_PROVIDER_SITE_OTHER): Payer: Self-pay

## 2019-01-09 DIAGNOSIS — F410 Panic disorder [episodic paroxysmal anxiety] without agoraphobia: Secondary | ICD-10-CM | POA: Diagnosis not present

## 2019-01-09 DIAGNOSIS — F411 Generalized anxiety disorder: Secondary | ICD-10-CM | POA: Diagnosis not present

## 2019-01-09 DIAGNOSIS — J45909 Unspecified asthma, uncomplicated: Secondary | ICD-10-CM | POA: Diagnosis not present

## 2019-01-09 DIAGNOSIS — J4520 Mild intermittent asthma, uncomplicated: Secondary | ICD-10-CM

## 2019-01-09 DIAGNOSIS — J302 Other seasonal allergic rhinitis: Secondary | ICD-10-CM

## 2019-01-09 MED ORDER — ALBUTEROL SULFATE HFA 108 (90 BASE) MCG/ACT IN AERS: 2.0000 | INHALATION_SPRAY | Freq: Four times a day (QID) | RESPIRATORY_TRACT | 1 refills | Status: AC | PRN

## 2019-01-09 MED ORDER — ESCITALOPRAM OXALATE 5 MG PO TABS: 5.0000 mg | ORAL_TABLET | Freq: Every day | ORAL | 2 refills | Status: DC

## 2019-01-09 MED ORDER — ESCITALOPRAM OXALATE 5 MG PO TABS: ORAL_TABLET | ORAL | 0 refills | Status: DC

## 2019-01-09 MED ORDER — ESCITALOPRAM OXALATE 10 MG PO TABS: 10.0000 mg | ORAL_TABLET | Freq: Every day | ORAL | 2 refills | Status: DC

## 2019-01-09 MED FILL — ALBUTEROL SULFATE HFA 108 (: 108 (90 BAS | 25 days supply | Qty: 9 | Fill #0

## 2019-01-09 MED FILL — ESCITALOPRAM 5 MG TABLET: 5 | 30 days supply | Qty: 42 | Fill #0

## 2019-01-09 NOTE — Assessment & Plan Note (Signed)
Patient notes that she was diagnosed with asthma as a child and had an albuterol inhaler that she used.  Since moving from [place], she has not been able to refill this.  Patient notes that her mother had and albuterol nebulizer which the patient tried and patient noted that her chest seemed to loosen up a little bit.  Physical exam revealed diminished breath sounds throughout  Plan: Will refer patient for PFTs.  In the meantime, albuterol inhaler sent to pharmacy

## 2019-01-09 NOTE — Patient Instructions (Addendum)
I am sorry you are going through what you are. I am sending a medication to your pharmacy. We will slowly taper up on the lexapro and I would like to see you again in about 6 weeks.

## 2019-01-09 NOTE — Assessment & Plan Note (Signed)
Patient is currently taking over-the-counter Benadryl for this.  She notes that she still struggling with symptoms.  We discussed other treatment options and patient is going to try some Zyrtec for a while.  I also recommended trying Flonase to help with her nasal congestion associated with allergies.

## 2019-01-09 NOTE — Progress Notes (Signed)
   CC: Anxiety  HPI:  [name] is a 38 y.o. female with no significant past medical history.  Patient presents today with concerns of progressive anxiety and panic attacks.  Patient notes the symptoms have been going on for about 2 months without any inciting event.  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  Patient notes feeling overwhelmed.  She has periods of time where she feels like she cannot breathe and that her chest is tight.  Symptoms occur both at work and at home.  She is noticed she has had an increased temperature lately and reacts more emotionally than usual.  She started to have difficulty sleeping at night.  She is also finding difficulty concentrating.  Poor appetite.  She also endorses some symptoms consistent with claustrophobia.  She can no longer take the elevator at work as she has concerns that it is going to not open up again and that she will not be able to breathe.  She has now resorted to only using the stairs. She endorses minimal caffeine use.  She is not currently on any medications.  No illicit drug use.  Minimal alcohol use.  No similar symptoms in the past.  She is never taken medications for depression or anxiety in the past.  No known family history of similar symptoms.  Patient denies history of any hypomanic or manic type episodes.   Past Medical History:  Diagnosis Date  . GERD (gastroesophageal reflux disease)   . Thyroid nodule    Review of Systems: Patient denies any recent fever, chills, nausea, vomiting, diarrhea, constipation, abdominal pain, cough, chest pain, heat or cold intolerance   Physical Exam:  Vitals:   01/09/19 1424  BP: 134/74  Pulse: 78  Temp: 98.7 F (37.1 C)  TempSrc: Oral  SpO2: 100%  Weight: 128 lb 6.4 oz (58.2 kg)    GENERAL: well appearing, in no apparent distress CARDIAC: heart regular  rate and rhythm, no peripheral edema appreciated PULMONARY: Diminished breath sounds SKIN: no rash or lesion on limited exam Psych: Alert and oriented x3.  Patient remains teary-eyed throughout the exam and does cry a couple of times while describing her symptoms.  Somewhat anxious affect.  Denies suicidal or homicidal ideation.   Assessment & Plan:   See Encounters Tab for problem based charting.  Pertinent labs & imaging results that were available during my care of the patient were reviewed by me and considered in my medical decision making  Patient is in agreement with the plan and endorses no further questions at this time.  Patient seen with Dr. Elwanda Brooklyn, MD Internal Medicine Resident-PGY1 01/09/19

## 2019-01-09 NOTE — Assessment & Plan Note (Addendum)
Patient presents to clinic today with complaints of 43-month history of progressive anxiety and panic attacks.  Please see HPI for more details.  We discussed treatment options including therapy and antidepressants.  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  She is also hesitant to take any medications as she has not done this in the past.  We discussed how this does not have to be a long-term plan but may help her through this difficult time.  Plan: Lexapro starting at 5 mg with taper up to 10 mg.  Recheck in 4 to 6 weeks.

## 2019-01-10 NOTE — Progress Notes (Signed)
Internal Medicine Clinic Attending  I saw and evaluated the patient.  I personally confirmed the key portions of the history and exam documented by Dr. Christian   and I reviewed pertinent patient test results.  The assessment, diagnosis, and plan were formulated together and I agree with the documentation in the resident's note.  

## 2019-01-21 ENCOUNTER — Ambulatory Visit
Admission: RE | Admit: 2019-01-21 | Discharge: 2019-01-21 | Disposition: A | Discharge status: Home / Self Care | Payer: No Typology Code available for payment source | Source: Ambulatory Visit | Attending: Internal Medicine | Admitting: Internal Medicine

## 2019-01-21 ENCOUNTER — Other Ambulatory Visit (HOSPITAL_COMMUNITY)
Admission: RE | Admit: 2019-01-21 | Discharge: 2019-01-21 | Disposition: A | Discharge status: Home / Self Care | Payer: No Typology Code available for payment source | Source: Ambulatory Visit | Attending: Physician Assistant | Admitting: Physician Assistant

## 2019-01-21 DIAGNOSIS — E041 Nontoxic single thyroid nodule: Secondary | ICD-10-CM | POA: Diagnosis present

## 2019-01-21 IMAGING — US ULTRASOUND FNA BIOPSY THYROID 1ST LESION
1 series · 13 of 13 positions shown · non-contrast
Comparison: Ultrasound done [DATE]

MEDICATIONS:
1% lidocaine 5 mL

COMPLICATIONS:
None immediate.

INDICATION: Indeterminate thyroid nodule

EXAM:
ULTRASOUND GUIDED FINE NEEDLE ASPIRATION OF INDETERMINATE THYROID
NODULE
TECHNIQUE: Informed written consent was obtained from the patient after a
discussion of the risks, benefits and alternatives to treatment.
Questions regarding the procedure were encouraged and answered. A
timeout was performed prior to the initiation of the procedure.

[Series 1: ultrasound fna biopsy thyroid 1st lesion · 0.04mm/px · 13 acquisitions, 13 frames shown]
[im 1/13]
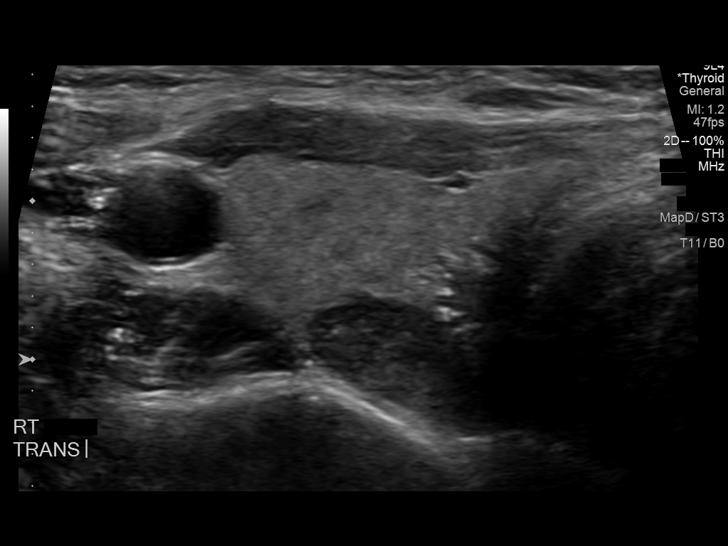
[im 2/13]
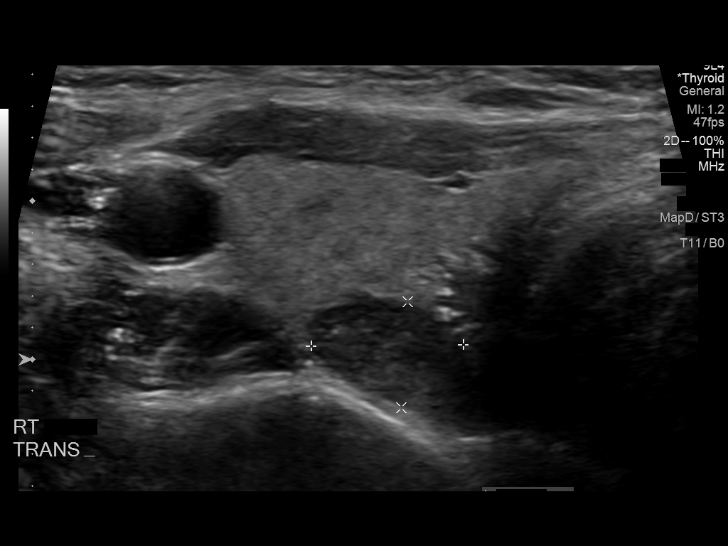
[im 3/13]
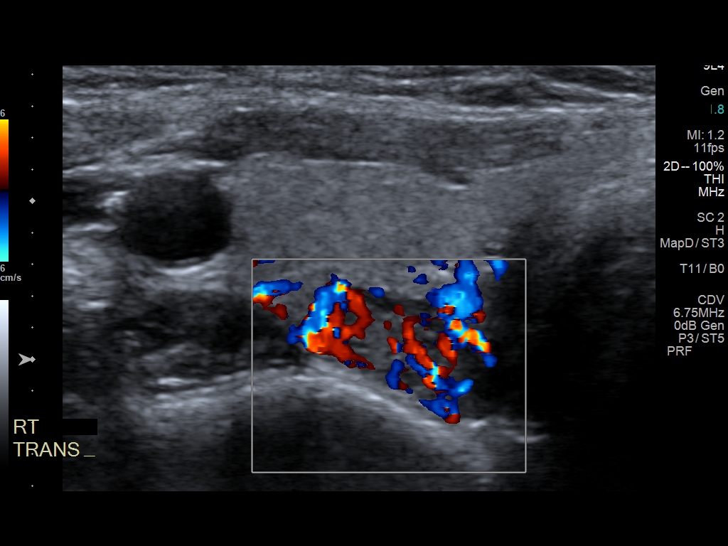
[im 4/13]
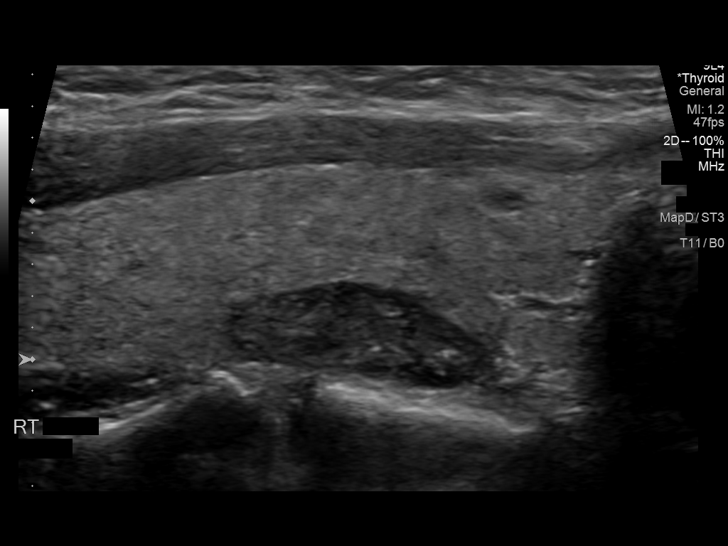
[im 5/13]
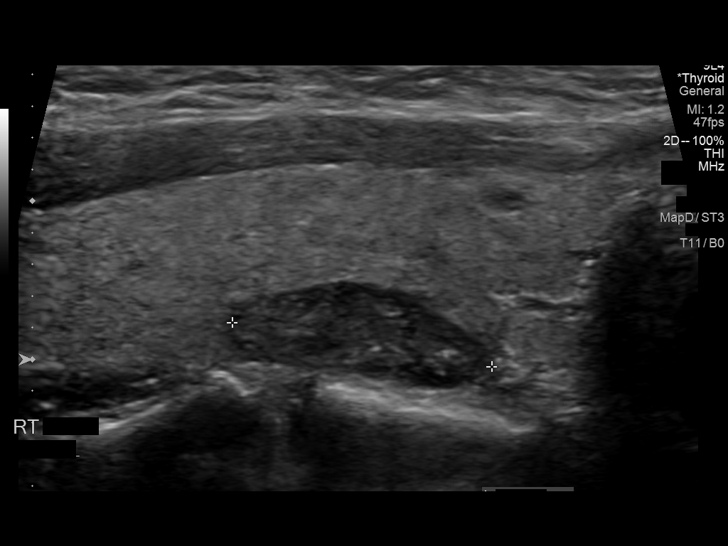
[im 6/13]
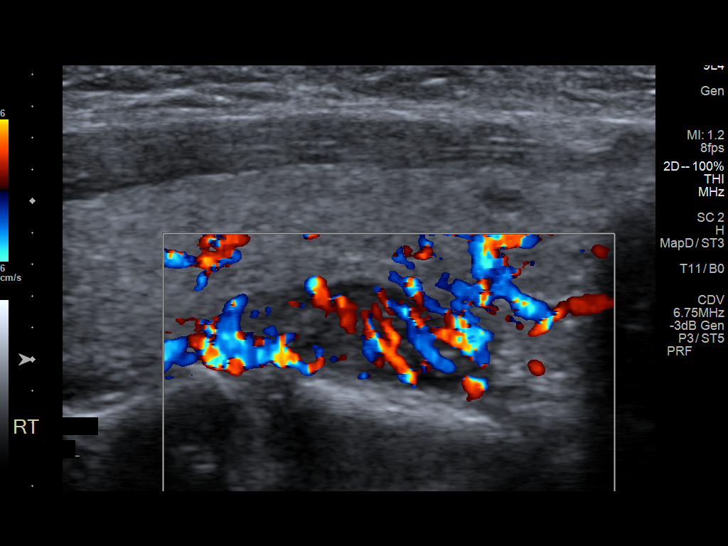
[im 7/13]
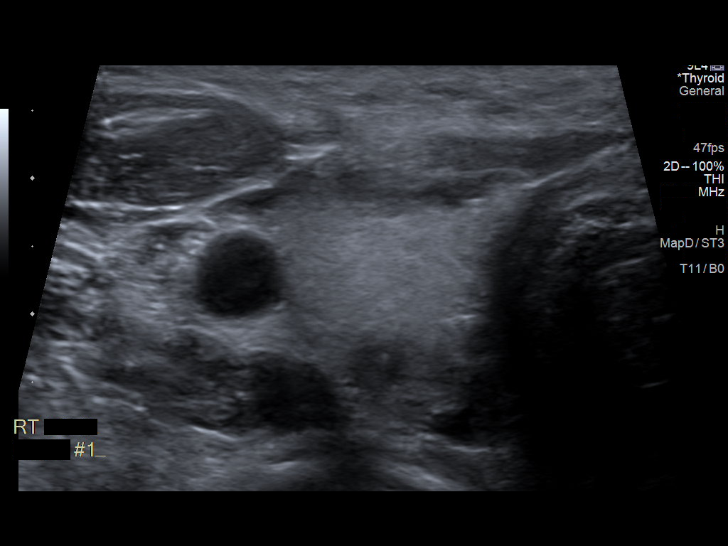
[im 8/13]
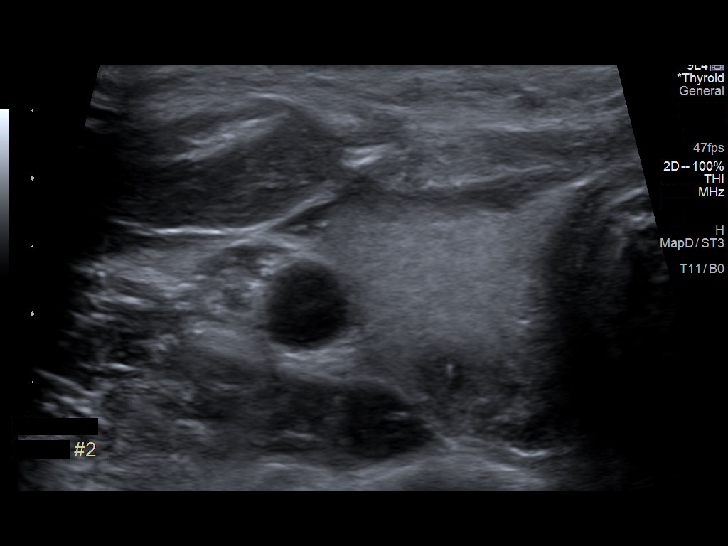
[im 9/13]
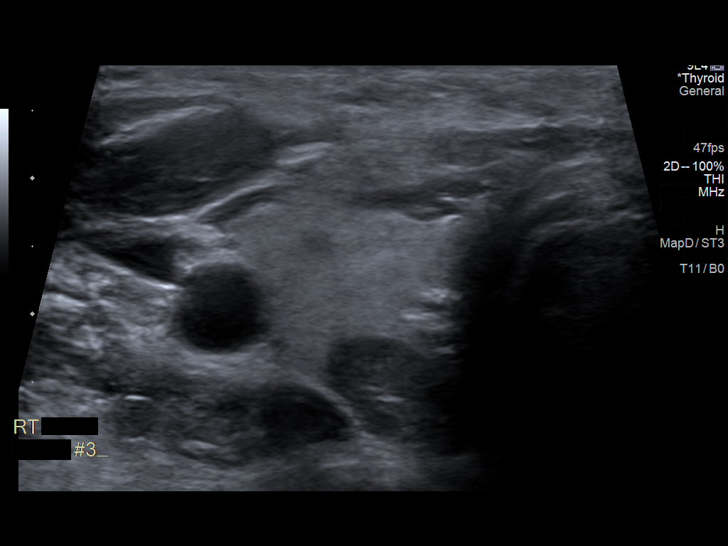
[im 10/13]
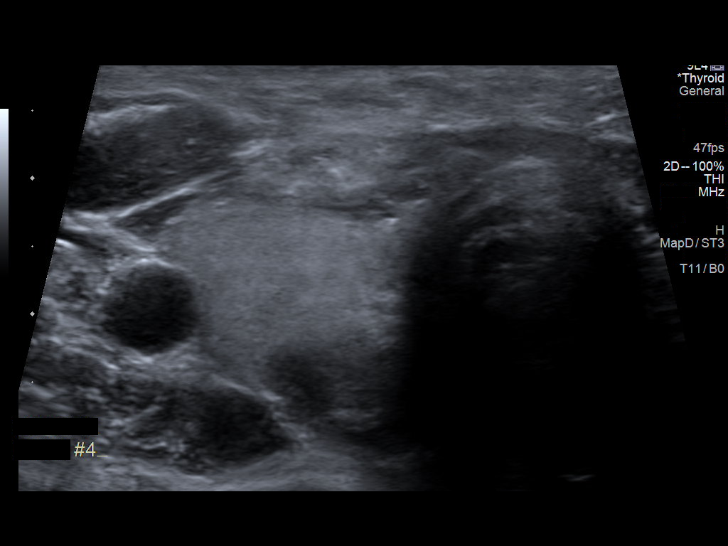
[im 11/13]
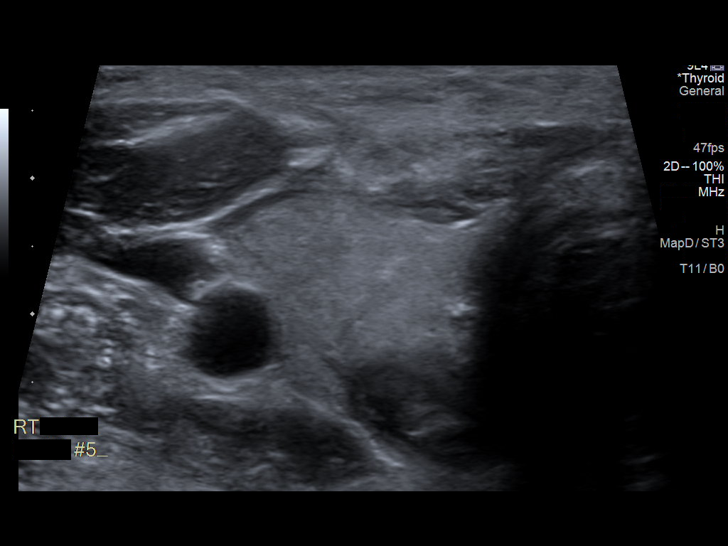
[im 12/13]
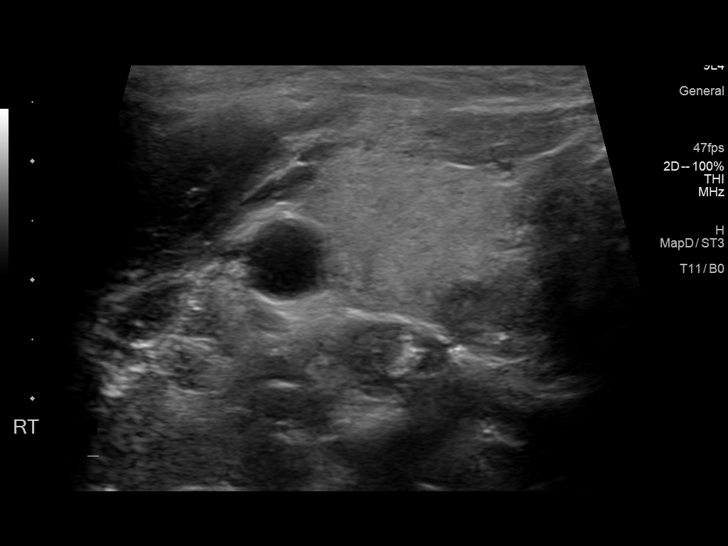
[im 13/13]
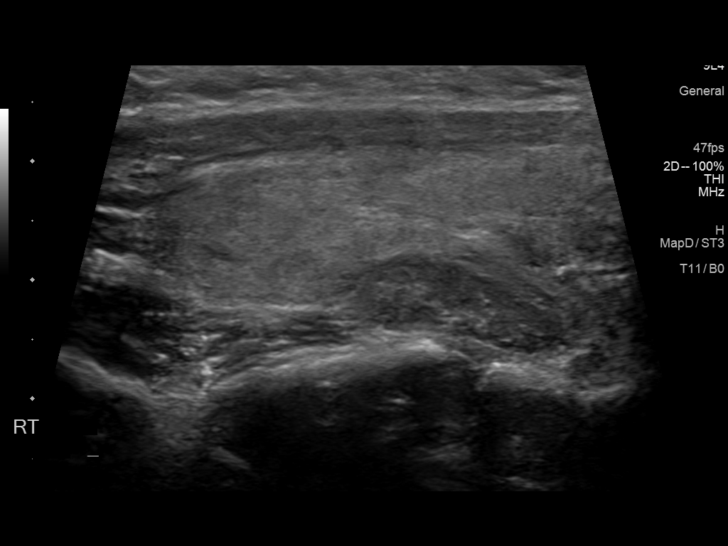

[13 of 13 positions shown; findings below may reference images not displayed]

Pre-procedural ultrasound scanning demonstrated unchanged size and
appearance of the indeterminate nodule within the right lobe of the
thyroid.

The procedure was planned. The neck was prepped in the usual sterile
fashion, and a sterile drape was applied covering the operative
field. A timeout was performed prior to the initiation of the
procedure. Local anesthesia was provided with 1% lidocaine.

Under direct ultrasound guidance, 5 FNA biopsies were performed of
the right thyroid nodule with a 25 gauge needle. Multiple ultrasound
images were saved for procedural documentation purposes. The samples
were prepared and submitted to pathology.

Limited post procedural scanning was negative for hematoma or
additional complication. Dressings were placed. The patient
tolerated the above procedures procedure well without immediate
postprocedural complication.
FINDINGS: FINDINGS
Nodule reference number based on prior diagnostic ultrasound: 1

Maximum size: 1.7 cm

Location: Right  ;  Mid

ACR TI-RADS risk category:  TR5

Reason for biopsy: meets ACR TI-RADS criteria

Ultrasound imaging confirms appropriate placement of the needles
within the thyroid nodule.
IMPRESSION: Technically successful ultrasound guided fine needle aspiration of
right thyroid nodule.

## 2019-01-21 NOTE — Procedures (Signed)
PROCEDURE SUMMARY:  Using direct ultrasound guidance, 5 passes were made using 25 g needles into the nodule within the right lobe of the thyroid.   Ultrasound was used to confirm needle placements on all occasions.   EBL = trace  Specimens were sent to Pathology for analysis.  See procedure note under Imaging tab in Epic for full procedure details.  Murrell Redden PA-C 01/21/2019 4:37 PM

## 2019-01-27 ENCOUNTER — Telehealth: Payer: Self-pay | Admitting: Internal Medicine

## 2019-01-27 NOTE — Telephone Encounter (Signed)
Patient has called stating she has further questions.  Please Advise, Thanks

## 2019-01-27 NOTE — Telephone Encounter (Signed)
Spoke to pt and she stated that she just not understand what was said to her this morning so I offered further explantation based on your message and pt stated that she understood

## 2019-01-27 NOTE — Telephone Encounter (Signed)
Discussed Cytology report with the pt as below      THYROID, FINE NEEDLE ASPIRATION (SPECIMEN 1 OF 1, COLLECTED ON 01/21/19) ATYPIA OF UNDETERMINED SIGNIFICANCE OR FOLLICULAR LESION OF UNDETERMINED SIGNIFICANCE (BETHESDA CATEGORY III).    Awaiting on Afirma to determine next step   Pt expressed understanding.     Abby Nena Jordan, MD  [facility] Endocrinology  [facility] [address] [place], [place] [address] Phone: [phone] FAX: [phone]

## 2019-01-27 NOTE — Telephone Encounter (Signed)
Please advise 

## 2019-02-10 ENCOUNTER — Encounter: Payer: Self-pay | Admitting: Internal Medicine

## 2019-02-10 ENCOUNTER — Encounter (HOSPITAL_COMMUNITY): Payer: Self-pay

## 2019-02-19 NOTE — Progress Notes (Deleted)
   CC: anxiety  HPI:  [name] is a 38 y.o. female who presents today for 6w follow up for anxiety. When presenting for initial visit, she noted symptoms consistent with generalized anxiety and panic disorder. She was placed on taper up to 10mg  lexapro at that time.       Past Medical History:  Diagnosis Date  . GERD (gastroesophageal reflux disease)   . Thyroid nodule     Review of Systems:  Review of Systems - {ros master:[id number]}   Physical Exam:  There were no vitals filed for this visit.  GENERAL: well appearing, in no apparent distress HEENT: no conjunctival injection. Nares patent.  CARDIAC: heart regular rate and rhythm, no peripheral edema appreciated PULMONARY: lung sounds clear to auscultation ABDOMEN: bowel sounds active.  SKIN: no rash or lesion on limited exam NEURO: CN II-XII grossly intact   Assessment & Plan:   See Encounters Tab for problem based charting.  Pertinent labs & imaging results that were available during my care of the patient were reviewed by me and considered in my medical decision making  Patient is in agreement with the plan and endorses no further questions at this time.  Patient {GC/GE:[id number]::"discussed with","seen with"} Dr. {NAMES:[id number]::"Butcher","Granfortuna","E. Hoffman","Mullen","Narendra","Raines","Vincent"}  Mitzi Hansen, MD Internal Medicine Resident-PGY1 02/19/19

## 2019-02-19 NOTE — Assessment & Plan Note (Deleted)
Current medications: lexapro 10mg  Patient started on lexapro about 6 weeks ago for symptoms consistent with anxiety.  Today, she notes that

## 2019-02-20 ENCOUNTER — Encounter: Payer: No Typology Code available for payment source | Admitting: Internal Medicine

## 2019-03-25 ENCOUNTER — Encounter: Payer: No Typology Code available for payment source | Admitting: Internal Medicine

## 2019-03-25 NOTE — Assessment & Plan Note (Deleted)
Medications: lexapro 10mg  Patient presenting for follow up appt. Was started on lexapro 7/31. PHQ9 score 5.

## 2019-04-16 NOTE — Progress Notes (Addendum)
   CC: back pain  HPI:  [name] is a 38 y.o. female who presents for acute back pain. Please see problem based assessment and plan for additional details.     Past Medical History:  Diagnosis Date  . GERD (gastroesophageal reflux disease)   . Thyroid nodule     Review of Systems:  Review of Systems - General ROS: negative for - chills or fever Psychological ROS: no anxiety or depression negative for - hallucinations Respiratory ROS: no cough, shortness of breath, or wheezing Cardiovascular ROS: no chest pain or dyspnea on exertion Gastrointestinal ROS: no abdominal pain, change in bowel habits, or black or bloody stools  Musculoskeletal ROS: muscle tension  Physical Exam:  Vitals:   04/17/19 1324  BP: 109/69  Pulse: 62  SpO2: 99%  Weight: 127 lb 14.4 oz (58 kg)    GENERAL: well appearing, in no apparent distress CARDIAC: heart regular rate and rhythm PULMONARY: lung sounds clear to auscultation ABDOMEN: bowel sounds active.  SKIN: no rash or lesion on limited exam MSK: pain with twisting motion. muscular tenderness over trapezius and paraspinal muscles.   Assessment & Plan:   See Encounters Tab for problem based charting.  Pertinent labs & imaging results that were available during my care of the patient were reviewed by me and considered in my medical decision making  Patient is in agreement with the plan and endorses no further questions at this time.  Patient seen with Dr. Adolm Joseph, MD Internal Medicine Resident-PGY1 04/17/19

## 2019-04-16 NOTE — Assessment & Plan Note (Addendum)
Has not been taking lexapro and no longer symptomatic. Will let me know if symptoms recur in the future.

## 2019-04-17 ENCOUNTER — Ambulatory Visit (INDEPENDENT_AMBULATORY_CARE_PROVIDER_SITE_OTHER): Payer: No Typology Code available for payment source | Admitting: Internal Medicine

## 2019-04-17 ENCOUNTER — Other Ambulatory Visit: Payer: Self-pay

## 2019-04-17 ENCOUNTER — Encounter: Payer: Self-pay | Admitting: Internal Medicine

## 2019-04-17 VITALS — BP 109/69 | HR 62 | Wt 127.9 lb

## 2019-04-17 DIAGNOSIS — M549 Dorsalgia, unspecified: Secondary | ICD-10-CM | POA: Insufficient documentation

## 2019-04-17 DIAGNOSIS — F411 Generalized anxiety disorder: Secondary | ICD-10-CM | POA: Diagnosis not present

## 2019-04-17 DIAGNOSIS — E041 Nontoxic single thyroid nodule: Secondary | ICD-10-CM

## 2019-04-17 MED ORDER — CYCLOBENZAPRINE HCL 5 MG PO TABS: 5.0000 mg | ORAL_TABLET | Freq: Three times a day (TID) | ORAL | 0 refills | Status: DC | PRN

## 2019-04-17 MED ORDER — KETOROLAC TROMETHAMINE 30 MG/ML IJ SOLN
30.0000 mg | Freq: Once | INTRAMUSCULAR | Status: AC
  Administered 2019-04-17: 30 mg via INTRAMUSCULAR

## 2019-04-17 MED ORDER — MELOXICAM 7.5 MG PO TABS: 7.5000 mg | ORAL_TABLET | Freq: Every day | ORAL | 0 refills | Status: DC

## 2019-04-17 MED FILL — MELOXICAM 7.5 MG TABLET: 7.5 | 10 days supply | Qty: 10 | Fill #0

## 2019-04-17 MED FILL — CYCLOBENZAPRINE 5 MG TABLET: 5 | 10 days supply | Qty: 30 | Fill #0

## 2019-04-17 NOTE — Assessment & Plan Note (Signed)
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 Located over left trapezius, left superior paraspinal muscles and left posterior chest wall. Worse with movements. Has tried tylenol and icy hot with minimal relief. PE significant for muscular tenderness over the above stated places.  Plan: will give one time IM tordol dose. 5d course of mobic. Flexeril prn. Conservative measures discussed. Will return to clinic if symptoms worsen.

## 2019-04-17 NOTE — Patient Instructions (Signed)
It was nice seeing you again today! For your back pain, I have sent in a couple of prescriptions into your pharmacy. The Flexeril is a muscle relaxer so it may make you tired. The Mobic is a stronger NSAID like ibuprofen so do not take the mobic with ibuprofen. I would also recommend that you use some voltaren gel as well. Continue ice/heat as well.

## 2019-05-07 NOTE — Progress Notes (Signed)
Internal Medicine Clinic Attending  I saw and evaluated the patient.  I personally confirmed the key portions of the history and exam documented by Dr. Christian   and I reviewed pertinent patient test results.  The assessment, diagnosis, and plan were formulated together and I agree with the documentation in the resident's note.  

## 2019-12-12 NOTE — Telephone Encounter (Signed)
SPOKE WITH PATIENT REGARDING INSURANCE.

## 2020-02-12 ENCOUNTER — Encounter: Payer: No Typology Code available for payment source | Admitting: Internal Medicine

## 2020-02-27 ENCOUNTER — Encounter: Payer: No Typology Code available for payment source | Admitting: Internal Medicine

## 2020-03-08 ENCOUNTER — Ambulatory Visit (INDEPENDENT_AMBULATORY_CARE_PROVIDER_SITE_OTHER): Payer: No Typology Code available for payment source | Admitting: Internal Medicine

## 2020-03-08 ENCOUNTER — Encounter: Payer: Self-pay | Admitting: *Deleted

## 2020-03-08 ENCOUNTER — Telehealth: Payer: Self-pay | Admitting: *Deleted

## 2020-03-08 ENCOUNTER — Encounter: Payer: Self-pay | Admitting: Internal Medicine

## 2020-03-08 ENCOUNTER — Other Ambulatory Visit: Payer: Self-pay

## 2020-03-08 ENCOUNTER — Other Ambulatory Visit: Payer: Self-pay | Admitting: Internal Medicine

## 2020-03-08 VITALS — BP 119/86 | HR 87 | Temp 98.3°F | Ht 63.0 in | Wt 128.3 lb

## 2020-03-08 DIAGNOSIS — F411 Generalized anxiety disorder: Secondary | ICD-10-CM

## 2020-03-08 DIAGNOSIS — Z1231 Encounter for screening mammogram for malignant neoplasm of breast: Secondary | ICD-10-CM

## 2020-03-08 MED ORDER — ALPRAZOLAM 0.25 MG PO TABS: 0.2500 mg | ORAL_TABLET | Freq: Three times a day (TID) | ORAL | 0 refills | Status: DC | PRN

## 2020-03-08 MED ORDER — ESCITALOPRAM OXALATE 10 MG PO TABS: ORAL_TABLET | ORAL | 2 refills | Status: DC

## 2020-03-08 MED ORDER — ESCITALOPRAM OXALATE 10 MG PO TABS: ORAL_TABLET | ORAL | 0 refills | Status: DC

## 2020-03-08 MED FILL — ALPRAZolam 0.25 MG TABS: 0.25 | 15 days supply | Qty: 90 | Fill #0

## 2020-03-08 NOTE — Telephone Encounter (Signed)
Marianne from [facility] called to change qty on Lexapro from 30 to 32 tabs. Also, wants to know if patient is to continue lexapro after the 5 weeks are up. Please send new Rx. Thank you. Kinnie Feil, BSN, RN-BC

## 2020-03-08 NOTE — Telephone Encounter (Signed)
Yes, that is ok to change to 32 tablets. Yes, she will continue on the 10mg  .

## 2020-03-08 NOTE — Progress Notes (Signed)
Office Visit   Patient ID: Melissa Blankenship, female    DOB: 22-Sep-1980, 39 y.o.   MRN: [id number]  Subjective:  CC: anxiety, panic attacks  HPI 39 y.o. presents today for evaluation and management of GAD.  I have seen her in the past for GAD. She has been on lexapro in the past which has worked well for her but had not been taking it for quite some time. She notes that her mom is severely ill for the past few months and she is having difficulty coping with this. She is very close with her mother and is an only child.  She also notes that she has been experiencing episodes where she feels like she can't breath and has a feeling of impending doom. Her heart will start racing and she will become shaking. These episodes as well as her underlying GAD have worsened to the extent where she is having difficulty functioning at work. She has missed several days due to this and is going to start working on [employer] paperwork.      ACTIVE MEDICATIONS   Current Outpatient Medications on File Prior to Visit  Medication Sig Dispense Refill  . albuterol (VENTOLIN HFA) 108 (90 Base) MCG/ACT inhaler Inhale 2 puffs into the lungs every 6 (six) hours as needed for wheezing or shortness of breath. 8 g 1  . cyclobenzaprine (FLEXERIL) 5 MG tablet Take 1 tablet (5 mg total) by mouth 3 (three) times daily as needed for muscle spasms. 30 tablet 0   No current facility-administered medications on file prior to visit.    ROS  Review of Systems  Respiratory: Positive for chest tightness and shortness of breath.   Cardiovascular: Positive for palpitations.  Neurological: Positive for tremors, light-headedness and headaches.  Psychiatric/Behavioral: Positive for decreased concentration and sleep disturbance. Negative for self-injury and suicidal ideas. The patient is nervous/anxious.     Objective:   BP 119/86 (BP Location: Left Arm, Patient Position: Sitting, Cuff Size: Normal)   Pulse 87   Temp 98.3 F (36.8  C) (Oral)   Ht 5\' 3"  (1.6 m)   Wt 128 lb 4.8 oz (58.2 kg)   LMP 04/29/2011   SpO2 99% Comment: room air  BMI 22.73 kg/m  Wt Readings from Last 3 Encounters:  03/08/20 128 lb 4.8 oz (58.2 kg)  04/17/19 127 lb 14.4 oz (58 kg)  01/09/19 128 lb 6.4 oz (58.2 kg)   BP Readings from Last 3 Encounters:  03/08/20 119/86  04/17/19 109/69  01/09/19 134/74   Physical Exam Constitutional:      Appearance: Normal appearance.  Cardiovascular:     Rate and Rhythm: Normal rate and regular rhythm.  Pulmonary:     Effort: Pulmonary effort is normal.     Breath sounds: Normal breath sounds.  Psychiatric:     Comments: Intermittently tearful throughout history and exam. Denies suicidal ideation.      Health Maintenance:   Health Maintenance  Topic Date Due  . Hepatitis C Screening  Never done  . COVID-19 Vaccine (1) Never done  . HIV Screening  Never done  . TETANUS/TDAP  Never done  . PAP SMEAR-Modifier  Never done  . INFLUENZA VACCINE  Never done     Assessment & Plan:   Problem List Items Addressed This Visit      Other   GAD (generalized anxiety disorder) with panic attacks - Primary    Pt presents today for evaluation and management of worsening sx of anxiety  and new onset panic attacks. She has not been taking lexapro for several months. Suspect worsening symptoms and panic attacks are attributable to acute stress in the setting of her critically ill mother. Plan --resume lexapro. 5mg  x7d. Increase to 10mg  thereafter --initiate xanax 0.25-0.5mg  3x daily prn for panic attacks. Medication risks discussed in detail. PDMP reviewed. --referral placed to integrated behavior health --f/u with a telehealth appt in 4w for reassessment of symptoms.      Relevant Medications   ALPRAZolam (XANAX) 0.25 MG tablet   Other Relevant Orders   Ambulatory referral to Integrated Behavioral Health (Completed)        Pt discussed with Dr. .  , MD Internal  Medicine Resident PGY-2 [facility] Pager: [phone] 03/09/2020 10:16 AM

## 2020-03-09 ENCOUNTER — Ambulatory Visit
Admission: RE | Admit: 2020-03-09 | Discharge: 2020-03-09 | Disposition: A | Discharge status: Home / Self Care | Payer: No Typology Code available for payment source | Source: Ambulatory Visit | Attending: Internal Medicine | Admitting: Internal Medicine

## 2020-03-09 DIAGNOSIS — Z1231 Encounter for screening mammogram for malignant neoplasm of breast: Secondary | ICD-10-CM

## 2020-03-09 IMAGING — MG DIGITAL SCREENING BILAT W/ TOMO W/ CAD
8 series · 9 of 24 positions shown · non-contrast
Comparison: None.

CLINICAL DATA: Screening.

EXAM:
DIGITAL SCREENING BILATERAL MAMMOGRAM WITH TOMO AND CAD

[R MLO synth-2D]
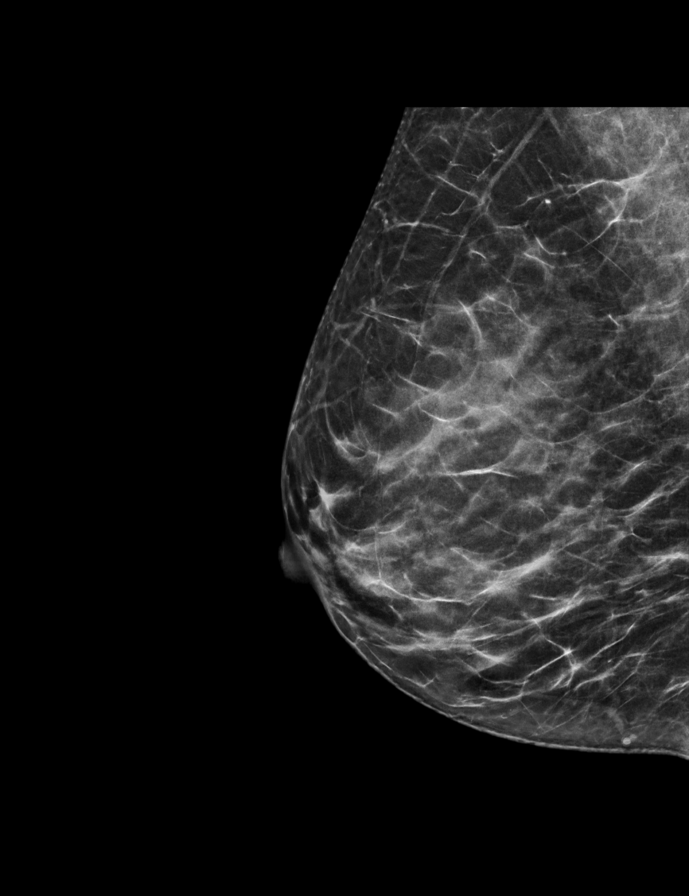

[L MLO synth-2D]
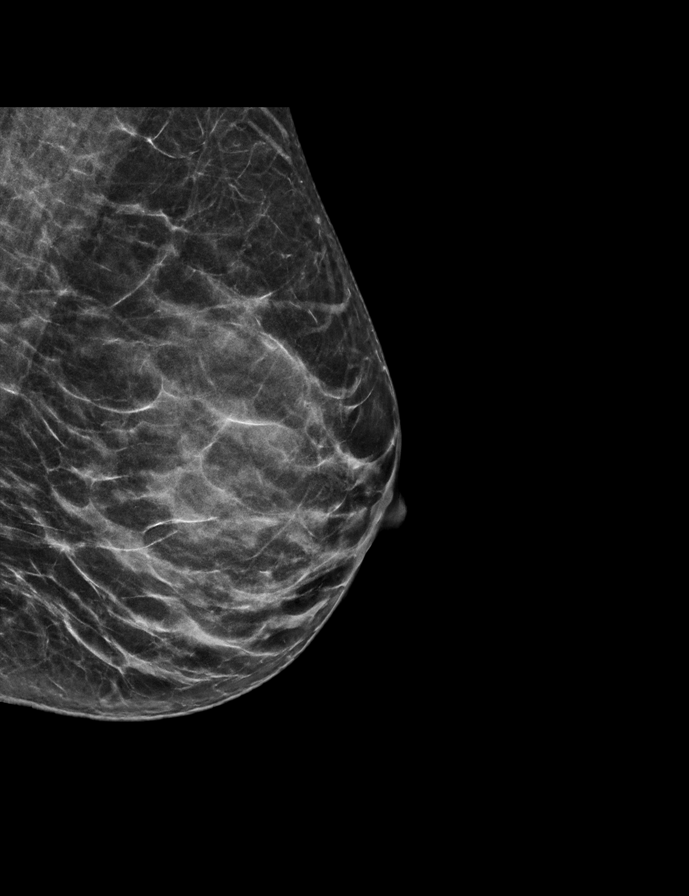

[R CC synth-2D]
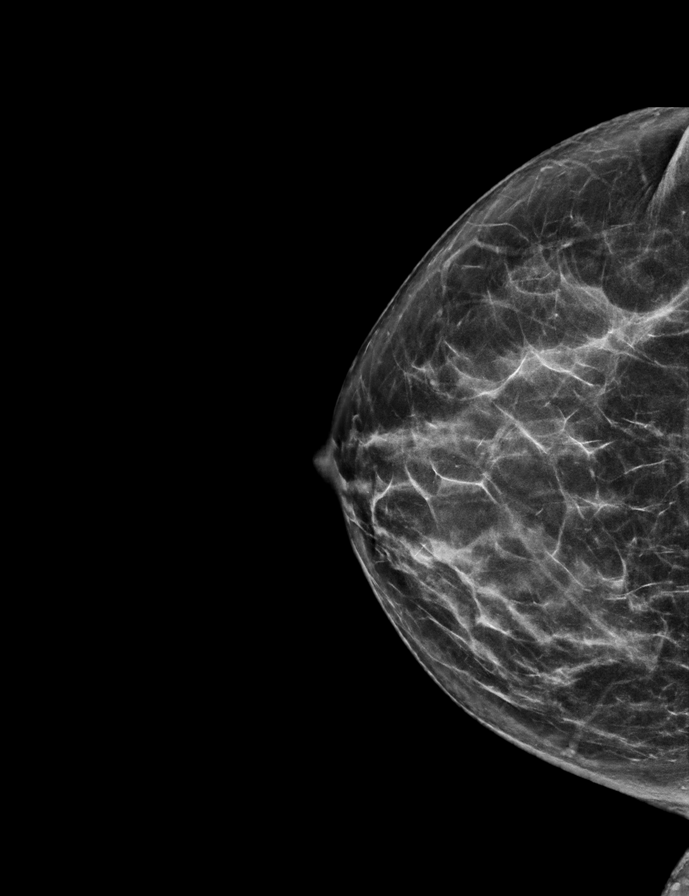

[L CC synth-2D]
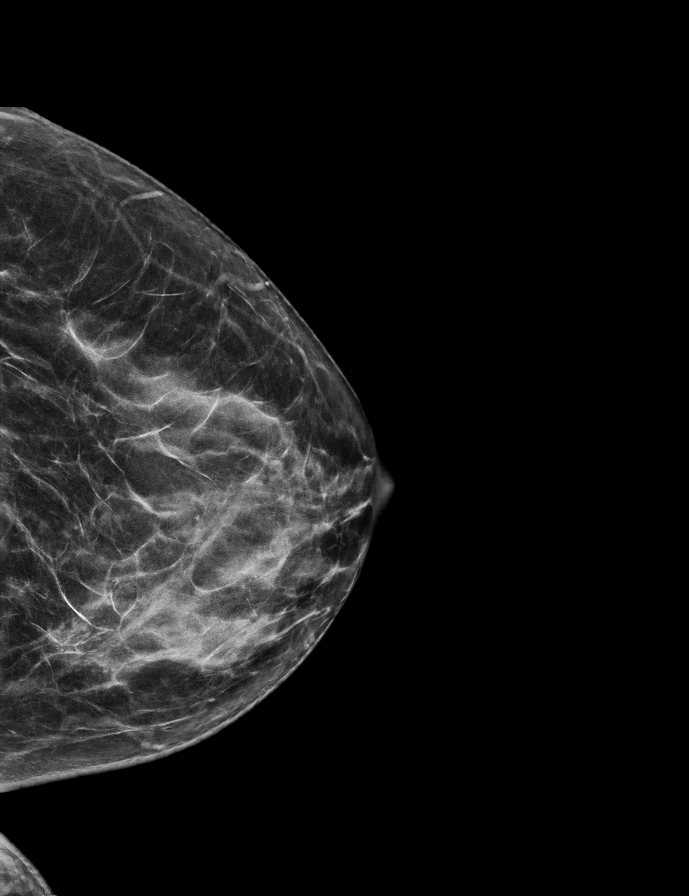

[L MLO tomo · 2 of 53 frames shown]
[frame 18/53]
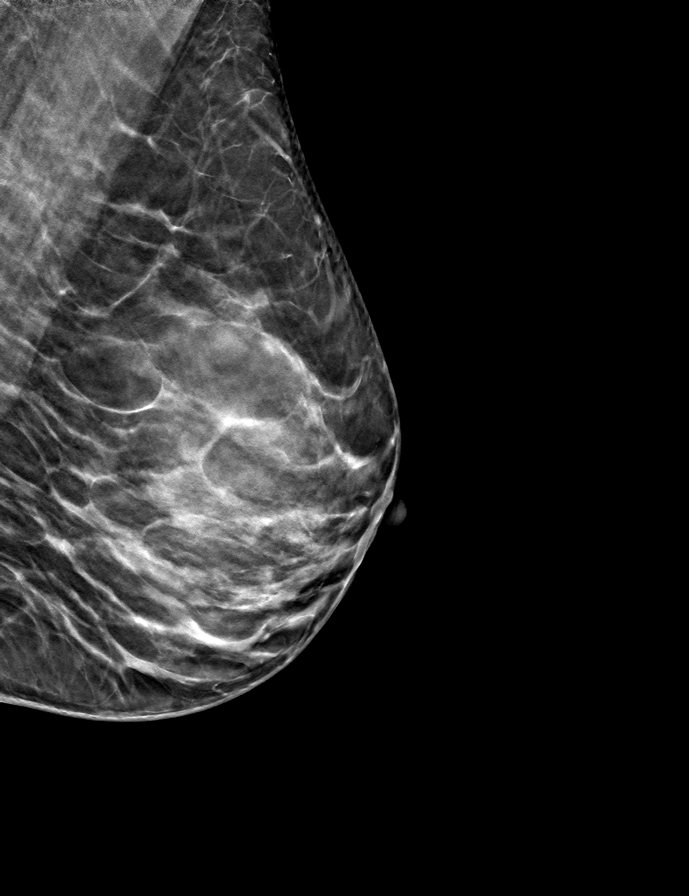
[frame 27/53]
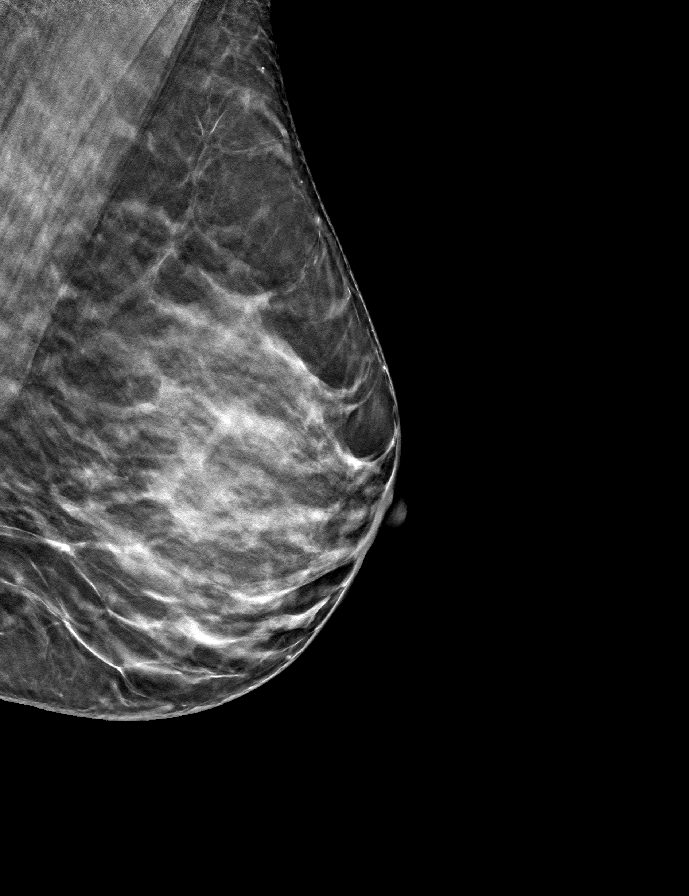

[R CC tomo · tomo slice 31/62.0]
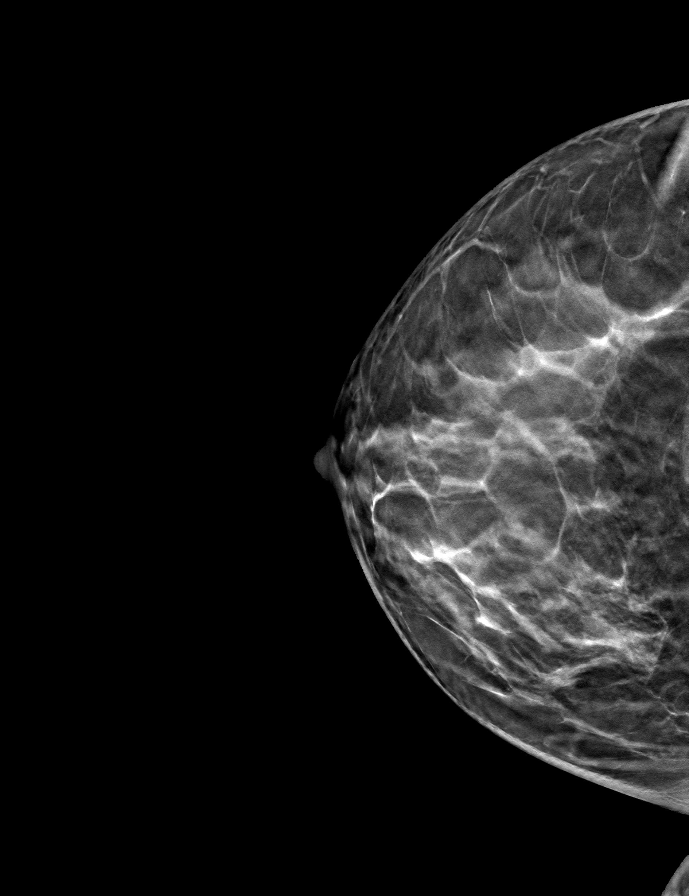

[R MLO tomo · tomo slice 28/55.0]
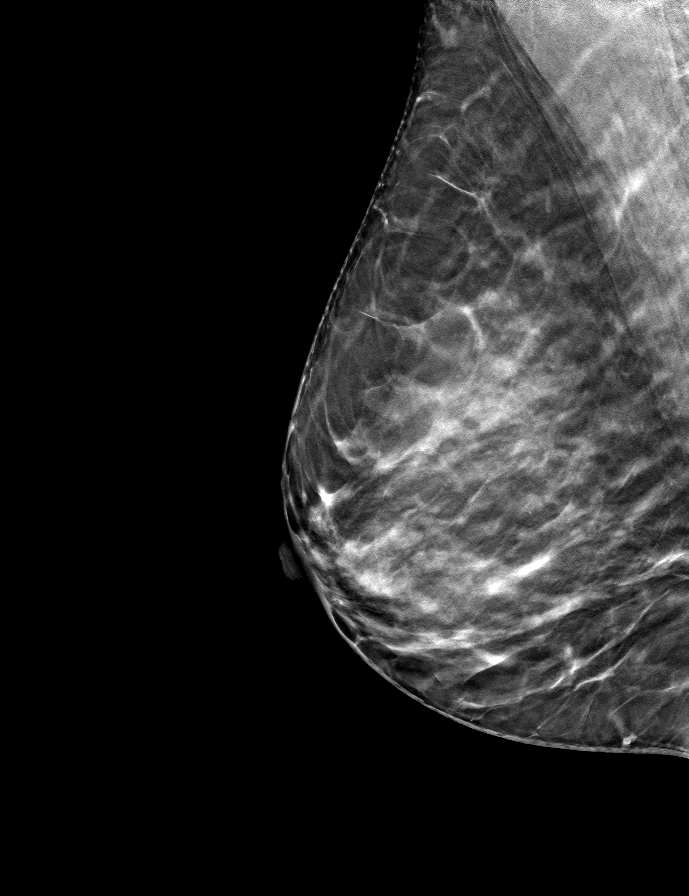

[L CC tomo · tomo slice 30/59.0]
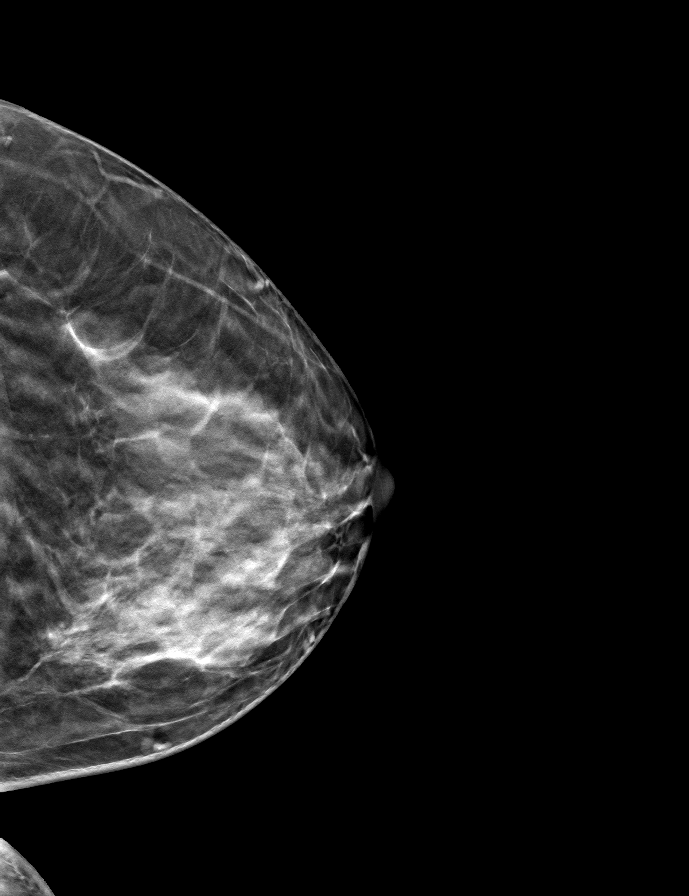

[9 of 24 positions shown; findings below may reference images not displayed]

ACR Breast Density Category c: The breast tissue is heterogeneously
dense, which may obscure small masses
FINDINGS: There are no findings suspicious for malignancy. Images were
processed with CAD.
IMPRESSION: No mammographic evidence of malignancy. A result letter of this
screening mammogram will be mailed directly to the patient.

RECOMMENDATION:
Screening mammogram in one year. (Code:[4W])

BI-RADS CATEGORY  1: Negative.

## 2020-03-09 NOTE — Assessment & Plan Note (Signed)
Pt presents today for evaluation and management of worsening sx of anxiety and new onset panic attacks. She has not been taking lexapro for several months. Suspect worsening symptoms and panic attacks are attributable to acute stress in the setting of her critically ill mother. Plan --resume lexapro. 5mg  x7d. Increase to 10mg  thereafter --initiate xanax 0.25-0.5mg  3x daily prn for panic attacks. Medication risks discussed in detail. PDMP reviewed. --referral placed to integrated behavior health --f/u with a telehealth appt in 4w for reassessment of symptoms.

## 2020-03-09 NOTE — Progress Notes (Signed)
Internal Medicine Clinic Attending  Case discussed with Dr. Ephriam Knuckles  At the time of the visit.  We reviewed the resident's history and exam and pertinent patient test results.  I agree with the assessment, diagnosis, and plan of care documented in the resident's note.  In particular we reviewed the appropriate role for benzodiazepines (dx of GAD; symptoms causing significant distress and impairment of daily functioning; patient screened for risk of misuse; short term use until SSRI/SNRI takes effect).

## 2020-03-26 ENCOUNTER — Other Ambulatory Visit (HOSPITAL_COMMUNITY): Payer: Self-pay | Admitting: Internal Medicine

## 2020-03-26 MED FILL — FLUARIX QUADRIVALENT 0.5 ML: 0.5 | 1 days supply | Qty: 1 | Fill #0

## 2020-04-05 ENCOUNTER — Other Ambulatory Visit: Payer: Self-pay

## 2020-04-05 ENCOUNTER — Ambulatory Visit (INDEPENDENT_AMBULATORY_CARE_PROVIDER_SITE_OTHER): Payer: No Typology Code available for payment source | Admitting: Internal Medicine

## 2020-04-05 ENCOUNTER — Encounter: Payer: Self-pay | Admitting: Internal Medicine

## 2020-04-05 ENCOUNTER — Other Ambulatory Visit: Payer: Self-pay | Admitting: Internal Medicine

## 2020-04-05 DIAGNOSIS — F411 Generalized anxiety disorder: Secondary | ICD-10-CM

## 2020-04-05 MED ORDER — TRAZODONE HCL 100 MG PO TABS: 100.0000 mg | ORAL_TABLET | Freq: Every day | ORAL | 1 refills | Status: DC

## 2020-04-05 MED FILL — traZODone HCL 100 MG TABS: 100 | 30 days supply | Qty: 30 | Fill #0

## 2020-04-05 NOTE — Patient Instructions (Signed)
Melissa Blankenship,  I am so sorry for what you are going through. You have nothing to be ashamed or embarrassed about because this is one of the hardest things someone has to go through, and I'm sorry you are having to go through it.   Please remember a few things: you are not alone, we are here to help, it is OK that you are not ok right now, and you WILL get through this.   I'd really like to help you get more sleep, which is what the trazodone is for. If you do not feel like it is working, we can always go up on the dose.   Try to start taking the Lexapro every day for the next 4 weeks so we can see how it helps long-term.   Please know it is ok to take the xanax when you feel a panic attack coming on.   Hang in there,  Dr. Chesley Mires

## 2020-04-06 ENCOUNTER — Encounter: Payer: Self-pay | Admitting: Internal Medicine

## 2020-04-06 NOTE — Assessment & Plan Note (Signed)
Ms. Ticer presents for 4 week follow-up after being initiated on Lexapro and prescribed xanax to use as needed for panic attacks.  She is quite tearful today and says things are not going well. She is the only child and has been taking care of  her mother who is quite ill and likely has limited life remaining. She admit to only taking Lexapro approximately twice a week. Regarding the Xanax, she really doesn't like using it, but says it does help when she feels a panic attack coming on. She is having a difficult time sleeping, has no appetite, has constantly racing thoughts with sense of impending doom. She especially feels like she is dying when the panic attacks come on. This is affecting all aspects of her life, and she is currently unable to work. I spent most of the visit providing therapeutic listening and encouragement. I explained the importance of giving the Lexapro a chance to help long-term, but it must be taken every day. She understands and will work on this for the next 4 weeks.  I also encouraged her to take the Xanax right as she feels a panic attack coming on.  Given her limited sleep, I also think she would benefit from a sleep aide. Will start Trazodone 100 mg qhs.

## 2020-04-06 NOTE — Progress Notes (Signed)
Acute Office Visit  Subjective:    Patient ID: Melissa Blankenship, female    DOB: 13-Mar-1981, 39 y.o.   MRN: [id number]  Chief Complaint  Patient presents with  . Follow-up    HPI Patient is in today for follow-up on anxiety and depression. Please see problem based charting for details on today's visit.   Past Medical History:  Diagnosis Date  . GERD (gastroesophageal reflux disease)   . Thyroid nodule     Past Surgical History:  Procedure Laterality Date  . ABDOMINAL HYSTERECTOMY    . INCISION AND DRAINAGE / EXCISION THYROGLOSSAL CYST  2019   path report revealed hyperplastic thyroid tissue, non-malignant   . TUBAL LIGATION      Family History  Problem Relation Age of Onset  . Lung cancer Mother   . Diabetes Mellitus II Mother   . Hypertension Mother   . Diabetes type II Father   . Hypertension Father   . CAD Maternal Grandfather     Social History   Socioeconomic History  . Marital status: Single    Spouse name: Not on file  . Number of children: Not on file  . Years of education: Not on file  . Highest education level: Not on file  Occupational History  . Not on file  Tobacco Use  . Smoking status: Current Some Day Smoker    Packs/day: 0.10  . Smokeless tobacco: Never Used  . Tobacco comment: 1 pk per 2 weeks   Substance and Sexual Activity  . Alcohol use: No  . Drug use: No  . Sexual activity: Yes    Birth control/protection: Injection  Other Topics Concern  . Not on file  Social History Narrative  . Not on file   Social Determinants of Health   Financial Resource Strain:   . Difficulty of Paying Living Expenses: Not on file  Food Insecurity:   . Worried About Programme researcher, broadcasting/film/video in the Last Year: Not on file  . Ran Out of Food in the Last Year: Not on file  Transportation Needs:   . Lack of Transportation (Medical): Not on file  . Lack of Transportation (Non-Medical): Not on file  Physical Activity:   . Days of Exercise per Week: Not on  file  . Minutes of Exercise per Session: Not on file  Stress:   . Feeling of Stress : Not on file  Social Connections:   . Frequency of Communication with Friends and Family: Not on file  . Frequency of Social Gatherings with Friends and Family: Not on file  . Attends Religious Services: Not on file  . Active Member of Clubs or Organizations: Not on file  . Attends Banker Meetings: Not on file  . Marital Status: Not on file  Intimate Partner Violence:   . Fear of Current or Ex-Partner: Not on file  . Emotionally Abused: Not on file  . Physically Abused: Not on file  . Sexually Abused: Not on file    Outpatient Medications Prior to Visit  Medication Sig Dispense Refill  . albuterol (VENTOLIN HFA) 108 (90 Base) MCG/ACT inhaler Inhale 2 puffs into the lungs every 6 (six) hours as needed for wheezing or shortness of breath. 8 g 1  . ALPRAZolam (XANAX) 0.25 MG tablet Take 1-2 tablets (0.25-0.5 mg total) by mouth 3 (three) times daily as needed for anxiety. 90 tablet 0  . cyclobenzaprine (FLEXERIL) 5 MG tablet Take 1 tablet (5 mg total) by mouth 3 (  three) times daily as needed for muscle spasms. 30 tablet 0  . escitalopram (LEXAPRO) 10 MG tablet Take 0.5 tablets (5 mg total) by mouth daily for 7 days, THEN 1 tablet (10 mg total) daily. 87 tablet 0   No facility-administered medications prior to visit.    No Known Allergies  Review of Systems  Constitutional: Positive for activity change and appetite change.  Psychiatric/Behavioral: Positive for decreased concentration, dysphoric mood and sleep disturbance. Negative for suicidal ideas. The patient is nervous/anxious.        Objective:    Physical Exam Constitutional:      General: She is not in acute distress.    Appearance: Normal appearance.  Neurological:     Mental Status: She is alert.  Psychiatric:        Attention and Perception: Attention and perception normal.        Mood and Affect: Mood is anxious.  Affect is tearful.        Speech: Speech normal.        Behavior: Behavior normal.        Thought Content: Thought content normal.        Cognition and Memory: Cognition normal.        Judgment: Judgment normal.     BP 106/79 (BP Location: Left Arm, Patient Position: Sitting, Cuff Size: Normal)   Pulse 91   Temp 98.2 F (36.8 C) (Oral)   Ht 5\' 3"  (1.6 m)   Wt 126 lb 9.6 oz (57.4 kg)   LMP 04/29/2011   SpO2 100% Comment: room air  BMI 22.43 kg/m  Wt Readings from Last 3 Encounters:  04/05/20 126 lb 9.6 oz (57.4 kg)  03/08/20 128 lb 4.8 oz (58.2 kg)  04/17/19 127 lb 14.4 oz (58 kg)    Health Maintenance Due  Topic Date Due  . Hepatitis C Screening  Never done  . COVID-19 Vaccine (1) Never done  . HIV Screening  Never done  . TETANUS/TDAP  Never done  . PAP SMEAR-Modifier  Never done    There are no preventive care reminders to display for this patient.   Lab Results  Component Value Date   TSH 0.466 12/09/2018   No results found for: WBC, HGB, HCT, MCV, PLT Lab Results  Component Value Date   NA 141 12/09/2018   K 4.0 12/09/2018   CO2 20 12/09/2018   GLUCOSE 82 12/09/2018   BUN 4 (L) 12/09/2018   CREATININE 0.76 12/09/2018   CALCIUM 9.2 12/09/2018   No results found for: CHOL No results found for: HDL No results found for: LDLCALC No results found for: TRIG No results found for: CHOLHDL No results found for: 12/11/2018     Assessment & Plan:   Problem List Items Addressed This Visit      Other   GAD (generalized anxiety disorder) with panic attacks    [name] presents for 4 week follow-up after being initiated on Lexapro and prescribed xanax to use as needed for panic attacks.  She is quite tearful today and says things are not going well. She is the only child and has been taking care of  her mother who is quite ill and likely has limited life remaining. She admit to only taking Lexapro approximately twice a week. Regarding the Xanax, she really  doesn't like using it, but says it does help when she feels a panic attack coming on. She is having a difficult time sleeping, has no appetite, has constantly  racing thoughts with sense of impending doom. She especially feels like she is dying when the panic attacks come on. This is affecting all aspects of her life, and she is currently unable to work. I spent most of the visit providing therapeutic listening and encouragement. I explained the importance of giving the Lexapro a chance to help long-term, but it must be taken every day. She understands and will work on this for the next 4 weeks.  I also encouraged her to take the Xanax right as she feels a panic attack coming on.  Given her limited sleep, I also think she would benefit from a sleep aide. Will start Trazodone 100 mg qhs.       Relevant Medications   traZODone (DESYREL) 100 MG tablet       Meds ordered this encounter  Medications  . traZODone (DESYREL) 100 MG tablet    Sig: Take 1 tablet (100 mg total) by mouth at bedtime.    Dispense:  30 tablet    Refill:  1     Zenola Dezarn D Sharleen Szczesny, DO

## 2020-04-12 NOTE — Progress Notes (Signed)
Internal Medicine Clinic Attending  Case discussed with Dr. Bloomfield  At the time of the visit.  We reviewed the resident's history and exam and pertinent patient test results.  I agree with the assessment, diagnosis, and plan of care documented in the resident's note.  

## 2020-05-14 ENCOUNTER — Encounter: Payer: Self-pay | Admitting: Internal Medicine

## 2020-05-14 ENCOUNTER — Ambulatory Visit (INDEPENDENT_AMBULATORY_CARE_PROVIDER_SITE_OTHER): Payer: No Typology Code available for payment source | Admitting: Internal Medicine

## 2020-05-14 ENCOUNTER — Other Ambulatory Visit: Payer: Self-pay | Admitting: Internal Medicine

## 2020-05-14 VITALS — BP 113/81 | HR 90 | Wt 129.0 lb

## 2020-05-14 DIAGNOSIS — M21619 Bunion of unspecified foot: Secondary | ICD-10-CM | POA: Insufficient documentation

## 2020-05-14 DIAGNOSIS — F411 Generalized anxiety disorder: Secondary | ICD-10-CM | POA: Diagnosis not present

## 2020-05-14 HISTORY — DX: Bunion of unspecified foot: M21.619

## 2020-05-14 MED ORDER — ESCITALOPRAM OXALATE 20 MG PO TABS
20.0000 mg | ORAL_TABLET | Freq: Every day | ORAL | 3 refills | Status: DC
Start: 2020-05-14 — End: 2020-05-14

## 2020-05-14 MED ORDER — TRAZODONE HCL 50 MG PO TABS: 100.0000 mg | ORAL_TABLET | Freq: Every day | ORAL | 1 refills | Status: DC

## 2020-05-14 MED FILL — ESCITALOPRAM 20 MG TABLET: 20 | 30 days supply | Qty: 30 | Fill #0

## 2020-05-14 NOTE — Progress Notes (Signed)
Office Visit   Patient ID: ALAYNNA KERWOOD, female    DOB: 1981-03-26, 39 y.o.   MRN: [id number]  Subjective:  CC: anxiety follow up  HPI 39 y.o. presents today for follow up of her persistent anxiety.  She was last seen in our office for this in October at which time she had been only taking the lexapro a couple times a week. She was re-educated on the importance of daily use. She had also noted difficulty sleeping, so trazodone was started.  Today, she does endorse a little improvement in her symptoms but still not much. She has been taking the 10mg  lexapro daily for the past 6w or so.  She notes that she uses the xanax for panic attacks which does help  She expresses feeling very overwhelmed regarding her mother's health, who is terminally ill. She worries about what the future will hold for her once her mother passes.   She also worries about people judging her for mental illness. She doesn't want people to think she is crazy. I provided reassurance.  She had been previously following with a counselor via telehealth through [facility] however implied that she did not really care for him and has not had further visits since her last office visit with . We discussed that we now have a psychologist in the clinic. She expressed interest in trying therapy through her.  She is working part time at her job here at the hospital. She still has to be absent intermittently due to her mood.  She denies suicidal thoughts or plans at today's visit.   She also requests a referral to podiatry for right foot pain. This has been a chronic issue. She feels like the bunion on her great toe is getting bigger, causing more discomfort.      ACTIVE MEDICATIONS   Current Outpatient Medications on File Prior to Visit  Medication Sig Dispense Refill  . albuterol (VENTOLIN HFA) 108 (90 Base) MCG/ACT inhaler Inhale 2 puffs into the lungs every 6 (six) hours as needed for wheezing or shortness of breath. 8 g  1  . ALPRAZolam (XANAX) 0.25 MG tablet Take 1-2 tablets (0.25-0.5 mg total) by mouth 3 (three) times daily as needed for anxiety. 90 tablet 0  . cyclobenzaprine (FLEXERIL) 5 MG tablet Take 1 tablet (5 mg total) by mouth 3 (three) times daily as needed for muscle spasms. 30 tablet 0   No current facility-administered medications on file prior to visit.    ROS  Review of Systems  Musculoskeletal: Positive for joint swelling.  Psychiatric/Behavioral: Positive for decreased concentration and sleep disturbance. Negative for self-injury and suicidal ideas. The patient is nervous/anxious.     Objective:   BP 113/81 (BP Location: Left Arm, Patient Position: Sitting, Cuff Size: Normal)   Pulse 90   Wt 129 lb (58.5 kg)   LMP 04/29/2011   SpO2 100%   BMI 22.85 kg/m  Wt Readings from Last 3 Encounters:  05/14/20 41 lb (58.5 kg)  04/05/20 126 lb 9.6 oz (57.4 kg)  03/08/20 128 lb 4.8 oz (58.2 kg)   BP Readings from Last 3 Encounters:  05/14/20 113/81  04/05/20 106/79  03/08/20 119/86   Physical Exam Constitutional:      Appearance: Normal appearance.  Musculoskeletal:     Comments: Left first toe bunion. Mildly tender on palpation. No erythema or edema. She also has very minimal tenderness to palpation of the plantar arch.   Neurological:     Mental Status: She  is alert.  Psychiatric:        Attention and Perception: Attention normal.        Mood and Affect: Mood is anxious and depressed. Affect is tearful.        Behavior: Behavior is cooperative.        Thought Content: Thought content does not include suicidal ideation. Thought content does not include suicidal plan.     Health Maintenance:   Health Maintenance  Topic Date Due  . Hepatitis C Screening  Never done  . HIV Screening  Never done  . TETANUS/TDAP  Never done  . PAP SMEAR-Modifier  Never done  . INFLUENZA VACCINE  Completed  . COVID-19 Vaccine  Completed     Assessment & Plan:   Problem List Items  Addressed This Visit      Musculoskeletal and Integument   Bunion    Referral placed to podiatry.      Relevant Orders   Ambulatory referral to Podiatry     Other   GAD (generalized anxiety disorder) with panic attacks - Primary (Chronic)    Symptoms are somewhat improved but still fairly impactful on her life. She notes feeling groggy in the morning on the 100mg  trazodone Plan -increase lexapro to 20mg  -decrease trazodone to 50mg  qHS -continue xanax 0.25mg -0.5mg  TID prn for panic attacks -referral placed for her to follow with Dr. -she will let me know how she is doing toward the end of the month. If she continues to have persistent symptoms, could consider adding buspar or can refer to psychiatry      Relevant Medications   escitalopram (LEXAPRO) 20 MG tablet   traZODone (DESYREL) 50 MG tablet   Other Relevant Orders   Ambulatory referral to Integrated Behavioral Health        Pt discussed with Dr. , MD Internal Medicine Resident PGY-2 [facility] Res[facility] Pager: [phone] 41/08/2019 1:30 PM

## 2020-05-14 NOTE — Assessment & Plan Note (Addendum)
Symptoms are somewhat improved but still fairly impactful on her life. She notes feeling groggy in the morning on the 100mg  trazodone Plan -increase lexapro to 20mg  -decrease trazodone to 50mg  qHS -continue xanax 0.25mg -0.5mg  TID prn for panic attacks -referral placed for her to follow with Dr. -she will let me know how she is doing toward the end of the month. If she continues to have persistent symptoms, could consider adding buspar or can refer to psychiatry

## 2020-05-14 NOTE — Assessment & Plan Note (Signed)
Referral placed to podiatry.

## 2020-05-17 NOTE — Progress Notes (Signed)
Internal Medicine Clinic Attending  Case discussed with Dr. Christian  At the time of the visit.  We reviewed the resident's history and exam and pertinent patient test results.  I agree with the assessment, diagnosis, and plan of care documented in the resident's note.  

## 2020-05-19 ENCOUNTER — Encounter: Payer: Self-pay | Admitting: Internal Medicine

## 2020-05-23 NOTE — Progress Notes (Deleted)
   Office Visit   Patient ID: Melissa Blankenship, female    DOB: 30-Nov-1980, 39 y.o.   MRN: [id number]  Subjective:  CC: depression, anxiety, cervical cancer screening  HPI 39 y.o. presents today for follow up of anxiety and depression.      ACTIVE MEDICATIONS   Outpatient Medications Prior to Visit  Medication Sig Dispense Refill  . albuterol (VENTOLIN HFA) 108 (90 Base) MCG/ACT inhaler Inhale 2 puffs into the lungs every 6 (six) hours as needed for wheezing or shortness of breath. 8 g 1  . ALPRAZolam (XANAX) 0.25 MG tablet Take 1-2 tablets (0.25-0.5 mg total) by mouth 3 (three) times daily as needed for anxiety. 90 tablet 0  . cyclobenzaprine (FLEXERIL) 5 MG tablet Take 1 tablet (5 mg total) by mouth 3 (three) times daily as needed for muscle spasms. 30 tablet 0  . escitalopram (LEXAPRO) 20 MG tablet Take 1 tablet (20 mg total) by mouth daily. 30 tablet 3  . traZODone (DESYREL) 50 MG tablet Take 2 tablets (100 mg total) by mouth at bedtime. 60 tablet 1   No facility-administered medications prior to visit.     ROS  Review of Systems  Objective:   LMP 04/29/2011  Wt Readings from Last 3 Encounters:  05/14/20 129 lb (58.5 kg)  04/05/20 126 lb 9.6 oz (57.4 kg)  03/08/20 128 lb 4.8 oz (58.2 kg)   BP Readings from Last 3 Encounters:  05/14/20 113/81  04/05/20 106/79  03/08/20 119/86   Physical Exam  Health Maintenance:   Health Maintenance  Topic Date Due  . Hepatitis C Screening  Never done  . HIV Screening  Never done  . TETANUS/TDAP  Never done  . PAP SMEAR-Modifier  Never done  . INFLUENZA VACCINE  Completed  . COVID-19 Vaccine  Completed     Assessment & Plan:   Problem List Items Addressed This Visit   None       Pt discussed with ***  Elige Radon, MD Internal Medicine Resident PGY-2 [facility] Pager: [phone] 05/23/2020 9:10 AM

## 2020-05-24 ENCOUNTER — Encounter: Payer: No Typology Code available for payment source | Admitting: Internal Medicine

## 2020-05-26 ENCOUNTER — Ambulatory Visit (INDEPENDENT_AMBULATORY_CARE_PROVIDER_SITE_OTHER): Payer: Medicaid Other | Admitting: Podiatry

## 2020-05-26 ENCOUNTER — Other Ambulatory Visit: Payer: Self-pay

## 2020-05-26 ENCOUNTER — Ambulatory Visit (INDEPENDENT_AMBULATORY_CARE_PROVIDER_SITE_OTHER): Payer: No Typology Code available for payment source

## 2020-05-26 DIAGNOSIS — M21612 Bunion of left foot: Secondary | ICD-10-CM

## 2020-05-26 DIAGNOSIS — M21611 Bunion of right foot: Secondary | ICD-10-CM

## 2020-05-26 DIAGNOSIS — M722 Plantar fascial fibromatosis: Secondary | ICD-10-CM | POA: Diagnosis not present

## 2020-05-26 MED FILL — ESCITALOPRAM 20 MG TABLET: 20 | 30 days supply | Qty: 30 | Fill #0

## 2020-05-26 NOTE — Progress Notes (Signed)
Subjective:   Patient ID: Melissa Blankenship, female   DOB: 39 y.o.   MRN: [id number]   HPI Patient presents stating that she needs to get this bunion fixed on her left is really bothering her and it hurts underneath the foot into the tendon stating that really bothers her too.  The bunion is worse left over right   Review of Systems  All other systems reviewed and are negative.       Objective:  Physical Exam Vitals and nursing note reviewed.  Constitutional:      Appearance: She is well-developed and well-nourished.  Cardiovascular:     Pulses: Intact distal pulses.  Pulmonary:     Effort: Pulmonary effort is normal.  Musculoskeletal:        General: Normal range of motion.  Skin:    General: Skin is warm.  Neurological:     Mental Status: She is alert.     Neurovascular status intact muscle strength adequate range of motion within normal limits with patient found to have hyperostosis with redness of the first metatarsal head left over right that is painful.  Patient states she is tried wider shoes she is tried to soak it and has tried reduced activity with no reduction with pain also in the mid arch area left over right.  Good digital perfusion well oriented x3     Assessment:  Significant structural HAV deformity left over right with pain around the joint surface with fasciitis-like symptoms left over right     Plan:  H&P all conditions discussed and I recommended distal osteotomy left along with fascial injection.  I explained the procedure to patient she wants to get this done in the first week or 2 January and I allowed her to read consent form today going over all possible complications alternative treatments.  She is willing to accept risk of surgery signed consent form is scheduled for outpatient surgery and is encouraged to call with questions.  Understands total recovery can take 6 months to 1 year  X-rays indicate that there is elevation of the intermetatarsal  angle between 1 and 2 of approximate 15 degrees bilateral with mild deviation moderate flatfoot deformity bilateral

## 2020-05-26 NOTE — Patient Instructions (Signed)
Bunion  A bunion is a bump on the base of the big toe that forms when the bones of the big toe joint move out of position. Bunions may be small at first, but they often get larger over time. They can make walking painful. What are the causes? A bunion may be caused by:  Wearing narrow or pointed shoes that force the big toe to press against the other toes.  Abnormal foot development that causes the foot to roll inward (pronate).  Changes in the foot that are caused by certain diseases, such as rheumatoid arthritis or polio.  A foot injury. What increases the risk? The following factors may make you more likely to develop this condition:  Wearing shoes that squeeze the toes together.  Having certain diseases, such as: ? Rheumatoid arthritis. ? Polio. ? Cerebral palsy.  Having family members who have bunions.  Being born with a foot deformity, such as flat feet or low arches.  Doing activities that put a lot of pressure on the feet, such as ballet dancing. What are the signs or symptoms? The main symptom of a bunion is a noticeable bump on the big toe. Other symptoms may include:  Pain.  Swelling around the big toe.  Redness and inflammation.  Thick or hardened skin on the big toe or between the toes.  Stiffness or loss of motion in the big toe.  Trouble with walking. How is this diagnosed? A bunion may be diagnosed based on your symptoms, medical history, and activities. You may have tests, such as:  X-rays. These allow your health care provider to check the position of the bones in your foot and look for damage to your joint. They also help your health care provider determine the severity of your bunion and the best way to treat it.  Joint aspiration. In this test, a sample of fluid is removed from the toe joint. This test may be done if you are in a lot of pain. It helps rule out diseases that cause painful swelling of the joints, such as arthritis. How is this  treated? Treatment depends on the severity of your symptoms. The goal of treatment is to relieve symptoms and prevent the bunion from getting worse. Your health care provider may recommend:  Wearing shoes that have a wide toe box.  Using bunion pads to cushion the affected area.  Taping your toes together to keep them in a normal position.  Placing a device inside your shoe (orthotics) to help reduce pressure on your toe joint.  Taking medicine to ease pain, inflammation, and swelling.  Applying heat or ice to the affected area.  Doing stretching exercises.  Surgery to remove scar tissue and move the toes back into their normal position. This treatment is rare. Follow these instructions at home: Managing pain, stiffness, and swelling   If directed, put ice on the painful area: ? Put ice in a plastic bag. ? Place a towel between your skin and the bag. ? Leave the ice on for 20 minutes, 2-3 times a day. Activity   If directed, apply heat to the affected area before you exercise. Use the heat source that your health care provider recommends, such as a moist heat pack or a heating pad. ? Place a towel between your skin and the heat source. ? Leave the heat on for 20-30 minutes. ? Remove the heat if your skin turns bright red. This is especially important if you are unable to feel pain,   heat, or cold. You may have a greater risk of getting burned.  Do exercises as told by your health care provider. General instructions  Support your toe joint with proper footwear, shoe padding, or taping as told by your health care provider.  Take over-the-counter and prescription medicines only as told by your health care provider.  Keep all follow-up visits as told by your health care provider. This is important. Contact a health care provider if your symptoms:  Get worse.  Do not improve in 2 weeks. Get help right away if you have:  Severe pain and trouble with walking. Summary  A  bunion is a bump on the base of the big toe that forms when the bones of the big toe joint move out of position.  Bunions can make walking painful.  Treatment depends on the severity of your symptoms.  Support your toe joint with proper footwear, shoe padding, or taping as told by your health care provider. This information is not intended to replace advice given to you by your health care provider. Make sure you discuss any questions you have with your health care provider. Document Revised: 12/03/2017 Document Reviewed: 10/09/2017 Elsevier Patient Education  2020 Elsevier Inc.  

## 2020-05-31 ENCOUNTER — Ambulatory Visit (INDEPENDENT_AMBULATORY_CARE_PROVIDER_SITE_OTHER): Payer: No Typology Code available for payment source | Admitting: Internal Medicine

## 2020-05-31 ENCOUNTER — Other Ambulatory Visit: Payer: Self-pay

## 2020-05-31 ENCOUNTER — Encounter: Payer: Self-pay | Admitting: Internal Medicine

## 2020-05-31 VITALS — BP 115/62 | HR 76 | Temp 98.6°F | Ht 63.0 in | Wt 128.1 lb

## 2020-05-31 DIAGNOSIS — Z9189 Other specified personal risk factors, not elsewhere classified: Secondary | ICD-10-CM

## 2020-05-31 DIAGNOSIS — Z1159 Encounter for screening for other viral diseases: Secondary | ICD-10-CM

## 2020-05-31 DIAGNOSIS — M21619 Bunion of unspecified foot: Secondary | ICD-10-CM

## 2020-05-31 DIAGNOSIS — Z114 Encounter for screening for human immunodeficiency virus [HIV]: Secondary | ICD-10-CM

## 2020-05-31 DIAGNOSIS — Z23 Encounter for immunization: Secondary | ICD-10-CM

## 2020-05-31 DIAGNOSIS — F411 Generalized anxiety disorder: Secondary | ICD-10-CM | POA: Diagnosis not present

## 2020-05-31 DIAGNOSIS — J4520 Mild intermittent asthma, uncomplicated: Secondary | ICD-10-CM

## 2020-05-31 DIAGNOSIS — Z0000 Encounter for general adult medical examination without abnormal findings: Secondary | ICD-10-CM

## 2020-06-01 ENCOUNTER — Encounter: Payer: Self-pay | Admitting: Internal Medicine

## 2020-06-01 ENCOUNTER — Ambulatory Visit: Payer: No Typology Code available for payment source | Admitting: Behavioral Health

## 2020-06-01 DIAGNOSIS — F411 Generalized anxiety disorder: Secondary | ICD-10-CM

## 2020-06-01 DIAGNOSIS — Z0000 Encounter for general adult medical examination without abnormal findings: Secondary | ICD-10-CM | POA: Insufficient documentation

## 2020-06-01 LAB — HEPATITIS C ANTIBODY: Hep C Virus Ab: <0.1 s/co ratio (ref 0.0–0.9)

## 2020-06-01 LAB — HIV ANTIBODY (ROUTINE TESTING W REFLEX): HIV Screen 4th Generation wRfx: NONREACTIVE

## 2020-06-01 NOTE — Progress Notes (Signed)
Internal Medicine Clinic Attending  Case discussed with Dr. Christian  At the time of the visit.  We reviewed the resident's history and exam and pertinent patient test results.  I agree with the assessment, diagnosis, and plan of care documented in the resident's note.  

## 2020-06-01 NOTE — Assessment & Plan Note (Signed)
She is due for cervical cancer screening. Offered to complete a pap smear at today's visit however she declines at this time. Advised her on the importance of screening for early detection of cervical cancer.  She is up to date on COVID-19 and influenza vaccinations. Tetanus vaccination provided at today's visit.  HIV and HCV screening completed and negative at today's visit.

## 2020-06-01 NOTE — Progress Notes (Signed)
New Patient Office Visit  Subjective:  Patient ID: Melissa Blankenship, female    DOB: 1980-10-26  Age: 39 y.o. MRN: [id number]  CC:  Chief Complaint  Patient presents with  . Annual Exam    HPI Melissa Blankenship presents for an annual wellness exam.  She is feeling well today and has no complaints.   Past Medical History:  Diagnosis Date  . Anxiety   . Depression   . GERD (gastroesophageal reflux disease)   . Thyroid nodule     Past Surgical History:  Procedure Laterality Date  . ABDOMINAL HYSTERECTOMY    . INCISION AND DRAINAGE / EXCISION THYROGLOSSAL CYST  2019   path report revealed hyperplastic thyroid tissue, non-malignant   . TUBAL LIGATION      Family History  Problem Relation Age of Onset  . Lung cancer Mother   . Diabetes Mellitus II Mother   . Hypertension Mother   . Diabetes type II Father   . Hypertension Father   . CAD Maternal Grandfather     Social History   Socioeconomic History  . Marital status: Single    Spouse name: Not on file  . Number of children: Not on file  . Years of education: Not on file  . Highest education level: Not on file  Occupational History  . Not on file  Tobacco Use  . Smoking status: Current Some Day Smoker    Packs/day: 0.10  . Smokeless tobacco: Never Used  . Tobacco comment: 1 pk per 2 weeks   Substance and Sexual Activity  . Alcohol use: No  . Drug use: No  . Sexual activity: Yes    Birth control/protection: Injection  Other Topics Concern  . Not on file  Social History Narrative  . Not on file   Social Determinants of Health   Financial Resource Strain: Not on file  Food Insecurity: Not on file  Transportation Needs: Not on file  Physical Activity: Not on file  Stress: Not on file  Social Connections: Not on file  Intimate Partner Violence: Not on file    ROS Review of Systems  Constitutional: Positive for fatigue. Negative for chills and fever.  Respiratory: Negative for cough and shortness  of breath.   Cardiovascular: Negative for chest pain and palpitations.  Gastrointestinal: Negative for abdominal pain and blood in stool.  Genitourinary: Negative for menstrual problem and pelvic pain.  Musculoskeletal: Negative for back pain and joint swelling.  Skin: Negative for rash.  Neurological: Negative for dizziness, light-headedness and headaches.  Psychiatric/Behavioral: Positive for sleep disturbance. Negative for self-injury and suicidal ideas. The patient is nervous/anxious.     Objective:   Today's Vitals: BP 115/62 (BP Location: Left Arm, Patient Position: Sitting, Cuff Size: Normal)   Pulse 76   Temp 98.6 F (37 C) (Oral)   Ht 5\' 3"  (1.6 m)   Wt 128 lb 1.6 oz (58.1 kg)   LMP 04/29/2011   SpO2 100% Comment: room air  BMI 22.69 kg/m   Physical Exam Constitutional:      Appearance: Normal appearance. She is normal weight.  Cardiovascular:     Rate and Rhythm: Normal rate and regular rhythm.  Pulmonary:     Effort: Pulmonary effort is normal.     Breath sounds: Normal breath sounds.  Abdominal:     General: Abdomen is flat.     Palpations: Abdomen is soft.  Musculoskeletal:        General: Normal range of motion.  Skin:    General: Skin is warm and dry.  Neurological:     General: No focal deficit present.     Mental Status: She is oriented to person, place, and time.  Psychiatric:        Mood and Affect: Mood normal.     Assessment & Plan:   Problem List Items Addressed This Visit      Respiratory   Asthma (Chronic)    Symptoms are well controlled at today's visit. No change in management at this time.        Musculoskeletal and Integument   Bunion (Chronic)    She is scheduled for a bunionectomy in January.        Other   GAD (generalized anxiety disorder) with panic attacks (Chronic)    She notes some improvement in morning fogginess with decrease in trazodone. No change in management at this time.      Health care maintenance  (Chronic)    She is due for cervical cancer screening. Offered to complete a pap smear at today's visit however she declines at this time. Advised her on the importance of screening for early detection of cervical cancer.  She is up to date on COVID-19 and influenza vaccinations. Tetanus vaccination provided at today's visit.  HIV and HCV screening completed and negative at today's visit.       Other Visit Diagnoses    Screening for HIV (human immunodeficiency virus)    -  Primary   Relevant Orders   HIV antibody (with reflex) (Completed)   Encounter for HCV screening test for high risk patient       Relevant Orders   Hepatitis C antibody (Completed)      Outpatient Encounter Medications as of 05/31/2020  Medication Sig  . albuterol (VENTOLIN HFA) 108 (90 Base) MCG/ACT inhaler Inhale 2 puffs into the lungs every 6 (six) hours as needed for wheezing or shortness of breath.  . ALPRAZolam (XANAX) 0.25 MG tablet Take 1-2 tablets (0.25-0.5 mg total) by mouth 3 (three) times daily as needed for anxiety.  . cyclobenzaprine (FLEXERIL) 5 MG tablet Take 1 tablet (5 mg total) by mouth 3 (three) times daily as needed for muscle spasms.  Marland Kitchen escitalopram (LEXAPRO) 20 MG tablet Take 1 tablet (20 mg total) by mouth daily.  . traZODone (DESYREL) 50 MG tablet Take 2 tablets (100 mg total) by mouth at bedtime.   No facility-administered encounter medications on file as of 05/31/2020.    Follow-up: Return in about 3 months (around 08/29/2020).   Pt discussed with Dr. Johny Shock, MD Internal Medicine Resident PGY-2 [facility] Pager: [phone] 06/01/2020 7:10 PM

## 2020-06-01 NOTE — Assessment & Plan Note (Signed)
She is scheduled for a bunionectomy in January.

## 2020-06-01 NOTE — Assessment & Plan Note (Signed)
Symptoms are well controlled at today's visit. No change in management at this time.

## 2020-06-01 NOTE — BH Specialist Note (Signed)
Integrated Behavioral Health via Telemedicine Visit  06/01/2020 Melissa Blankenship [id number]  Number of Integrated Behavioral Health visits: Initial Session Start time: 1:00pm  Session End time: 1:50pm Total time: 50   Referring Provider: [facility] Resident Patient/Family location: Pt's home in a private space [facility] Provider location: [facility] Office All persons participating in visit: Pt & Clinician Types of Service: Individual psychotherapy  I connected with Sherin N Zima and/or Leyton N Zoeller's self by Telephone  (Video is Surveyor, mining) and verified that I am speaking with the correct person using two identifiers.Discussed confidentiality: Yes   I discussed the limitations of telemedicine and the availability of in person appointments.  Discussed there is a possibility of technology failure and discussed alternative modes of communication if that failure occurs.  I discussed that engaging in this telemedicine visit, they consent to the provision of behavioral healthcare and the services will be billed under their insurance.  Patient and/or legal guardian expressed understanding and consented to Telemedicine visit: Yes   Presenting Concerns: Patient and/or family reports the following symptoms/concerns: Anxiety & panic attacks; Pt takes Lexapro 20mg -recently titrated, Trazadone for sleep & Alprazelam 0.25mg . Pt also uses Melatonin 10mg . Duration of problem: Since Feb 2021 when Mother was d/c'd from [facility]; Severity of problem: It has worsened over the past few months  Patient and/or Family's Strengths/Protective Factors: Social and Emotional competence, Concrete supports in place (healthy food, safe environments, etc.) and Sense of purpose  Goals Addressed: Patient will: 1.  Reduce symptoms of: anxiety, depression and stress  2.  Increase knowledge and/or ability of: coping skills and stress reduction  3.  Demonstrate ability to: Increase healthy adjustment to current life  circumstances  Progress towards Goals: Established today  Interventions: Interventions utilized:  Intake/Assessment process Standardized Assessments completed: Screen for anx/dep next session.  Patient and/or Family Response: Administer PHQ-9 & GAD-7 next visit. 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 18yo Dtr is also in the home & handling some of the care at night when Pt works.   Assessment: Patient currently experiencing anxiety & panic attacks.   Patient may benefit from [employer] about anxiety/dep & panic attacks. Caregiver role support & encouragement to normalize & validate Pt feelings.   Plan: 1. Follow up with behavioral health clinician on : one week 2. Behavioral recommendations: Use calm.com, rely more on resources & optimize time needed alone at work as able.  3. Referral(s): Integrated Mar 2021 (In Clinic)  I discussed the assessment and treatment plan with the patient and/or parent/guardian. They were provided an opportunity to ask questions and all were answered. They agreed with the plan and demonstrated an understanding of the instructions.   They were advised to call back or seek an in-person evaluation if the symptoms worsen or if the condition fails to improve as anticipated.  [facility], LMFT

## 2020-06-01 NOTE — Progress Notes (Deleted)
New Patient Office Visit  Subjective:  Patient ID: Melissa Blankenship, female    DOB: 03/18/1981  Age: 39 y.o. MRN: [id number]  CC:  Chief Complaint  Patient presents with  . Annual Exam    HPI [employer] presents for ***  Past Medical History:  Diagnosis Date  . GERD (gastroesophageal reflux disease)   . Thyroid nodule     Past Surgical History:  Procedure Laterality Date  . ABDOMINAL HYSTERECTOMY    . INCISION AND DRAINAGE / EXCISION THYROGLOSSAL CYST  2019   path report revealed hyperplastic thyroid tissue, non-malignant   . TUBAL LIGATION      Family History  Problem Relation Age of Onset  . Lung cancer Mother   . Diabetes Mellitus II Mother   . Hypertension Mother   . Diabetes type II Father   . Hypertension Father   . CAD Maternal Grandfather     Social History   Socioeconomic History  . Marital status: Single    Spouse name: Not on file  . Number of children: Not on file  . Years of education: Not on file  . Highest education level: Not on file  Occupational History  . Not on file  Tobacco Use  . Smoking status: Current Some Day Smoker    Packs/day: 0.10  . Smokeless tobacco: Never Used  . Tobacco comment: 1 pk per 2 weeks   Substance and Sexual Activity  . Alcohol use: No  . Drug use: No  . Sexual activity: Yes    Birth control/protection: Injection  Other Topics Concern  . Not on file  Social History Narrative  . Not on file   Social Determinants of Health   Financial Resource Strain: Not on file  Food Insecurity: Not on file  Transportation Needs: Not on file  Physical Activity: Not on file  Stress: Not on file  Social Connections: Not on file  Intimate Partner Violence: Not on file    ROS Review of Systems  Objective:   Today's Vitals: BP 115/62 (BP Location: Left Arm, Patient Position: Sitting, Cuff Size: Normal)   Pulse 76   Temp 98.6 F (37 C) (Oral)   Ht 5\' 3"  (1.6 m)   Wt 128 lb 1.6 oz (58.1 kg)   LMP  04/29/2011   SpO2 100% Comment: room air  BMI 22.69 kg/m   Physical Exam  Assessment & Plan:   Problem List Items Addressed This Visit   None   Visit Diagnoses    Screening for HIV (human immunodeficiency virus)    -  Primary   Relevant Orders   HIV antibody (with reflex) (Completed)   Encounter for HCV screening test for high risk patient       Relevant Orders   Hepatitis C antibody (Completed)      Outpatient Encounter Medications as of 05/31/2020  Medication Sig  . albuterol (VENTOLIN HFA) 108 (90 Base) MCG/ACT inhaler Inhale 2 puffs into the lungs every 6 (six) hours as needed for wheezing or shortness of breath.  . ALPRAZolam (XANAX) 0.25 MG tablet Take 1-2 tablets (0.25-0.5 mg total) by mouth 3 (three) times daily as needed for anxiety.  . cyclobenzaprine (FLEXERIL) 5 MG tablet Take 1 tablet (5 mg total) by mouth 3 (three) times daily as needed for muscle spasms.  06/02/2020 escitalopram (LEXAPRO) 20 MG tablet Take 1 tablet (20 mg total) by mouth daily.  . traZODone (DESYREL) 50 MG tablet Take 2 tablets (100 mg total) by mouth  at bedtime.   No facility-administered encounter medications on file as of 05/31/2020.    Follow-up: No follow-ups on file.   Elige Radon, MD

## 2020-06-01 NOTE — Assessment & Plan Note (Signed)
She notes some improvement in morning fogginess with decrease in trazodone. No change in management at this time.

## 2020-06-03 ENCOUNTER — Telehealth: Payer: Self-pay

## 2020-06-03 NOTE — Telephone Encounter (Signed)
DOS 06/22/2020  [place] BUNIONECTOMY LT - 11735 INJECTION HEEL LT - 20550  SPOKE TO JESZI AT [facility], SHE STATED PRIOR AUTH IS REQUIRED FOR ASC. SHE AUTHORIZED CPT [id number] & [id number]. AUTH # [id number] GOOD FROM 06/22/2020 - 07/22/2020

## 2020-06-10 ENCOUNTER — Ambulatory Visit: Payer: No Typology Code available for payment source | Admitting: Behavioral Health

## 2020-06-21 ENCOUNTER — Other Ambulatory Visit: Payer: Self-pay | Admitting: Podiatry

## 2020-06-21 MED ORDER — OXYCODONE-ACETAMINOPHEN 10-325 MG PO TABS: 1.0000 | ORAL_TABLET | ORAL | 0 refills | Status: DC | PRN

## 2020-06-21 MED ORDER — ONDANSETRON HCL 4 MG PO TABS: 4.0000 mg | ORAL_TABLET | Freq: Three times a day (TID) | ORAL | 0 refills | Status: DC | PRN

## 2020-06-21 MED FILL — OXYCODONE-APAP 10-325: 10-325 | 5 days supply | Qty: 25 | Fill #0

## 2020-06-21 MED FILL — ONDANSETRON HCL 4 MG TABLET: 4 | 7 days supply | Qty: 20 | Fill #0

## 2020-06-21 NOTE — Addendum Note (Signed)
Addended by: Lenn Sink on: 06/21/2020 02:16 PM   Modules accepted: Orders

## 2020-06-22 DIAGNOSIS — M722 Plantar fascial fibromatosis: Secondary | ICD-10-CM

## 2020-06-22 DIAGNOSIS — M2012 Hallux valgus (acquired), left foot: Secondary | ICD-10-CM

## 2020-06-22 HISTORY — PX: BUNIONECTOMY: SHX129

## 2020-06-24 ENCOUNTER — Ambulatory Visit: Payer: No Typology Code available for payment source | Admitting: Behavioral Health

## 2020-06-24 ENCOUNTER — Other Ambulatory Visit: Payer: Self-pay

## 2020-06-24 DIAGNOSIS — F411 Generalized anxiety disorder: Secondary | ICD-10-CM

## 2020-06-24 NOTE — BH Specialist Note (Signed)
Integrated Behavioral Health via Telemedicine Visit  06/24/2020 Melissa Blankenship [id number]  Number of Integrated Behavioral Health visits: 2/6 Session Start time: 10:00am  Session End time: 10:15am Total time: 15  Referring Provider: Dr. Elige Radon, MD Patient/Family location: Pt is home in bed after foot surgery on Wed of this week; she is in pain. This will just be a brief check-in today. [facility] Provider location: [facility] Office All persons participating in visit: Pt & Clinician Types of Service: Individual psychotherapy  I connected with Melissa Blankenship and/or Melissa Blankenship's self by Telephone  (Video is Surveyor, mining) and verified that I am speaking with the correct person using two identifiers.Discussed confidentiality: Yes   I discussed the limitations of telemedicine and the availability of in person appointments.  Discussed there is a possibility of technology failure and discussed alternative modes of communication if that failure occurs.  I discussed that engaging in this telemedicine visit, they consent to the provision of behavioral healthcare and the services will be billed under their insurance.  Patient and/or legal guardian expressed understanding and consented to Telemedicine visit: Yes   Presenting Concerns: Patient and/or family reports the following symptoms/concerns: post surgical foot pain/Pt had old foot injury that did not heal properly. Surgeon extracted bone & placed a rod Duration of problem: Several yrs of discomfort walking; Severity of problem: severe  Patient and/or Family's Strengths/Protective Factors: Social connections, Social and Emotional competence and Concrete supports in place (healthy food, safe environments, etc.)  Goals Addressed: Patient will: 1.  Reduce symptoms of: anxiety and pain  2.  Increase knowledge and/or ability of: stress reduction and pain medication use. Pt does not like taking medication & will transition to  Tylenol asap per her report  3.  Demonstrate ability to: Increase healthy adjustment to current life circumstances  Progress towards Goals: Ongoing  Interventions: Interventions utilized:  Supportive Counseling Standardized Assessments completed: Not Needed  Patient and/or Family Response: Pt receptive to call of support  Assessment: Patient currently experiencing extreme pain post-surgically. Pt has a Son & Dtr who both are helping & checking on her.  Patient may benefit from f/u call.  Plan: 1. Follow up with behavioral health clinician on : 2 wks 2. Behavioral recommendations: rest, use crutches as directed by Physician 3. Referral(s): Integrated [employer] (In Clinic)  I discussed the assessment and treatment plan with the patient and/or parent/guardian. They were provided an opportunity to ask questions and all were answered. They agreed with the plan and demonstrated an understanding of the instructions.   They were advised to call back or seek an in-person evaluation if the symptoms worsen or if the condition fails to improve as anticipated.  Deneise Lever, LMFT

## 2020-06-28 ENCOUNTER — Encounter: Payer: No Typology Code available for payment source | Admitting: Podiatry

## 2020-06-30 ENCOUNTER — Encounter: Payer: Self-pay | Admitting: Podiatry

## 2020-06-30 ENCOUNTER — Ambulatory Visit (INDEPENDENT_AMBULATORY_CARE_PROVIDER_SITE_OTHER): Payer: No Typology Code available for payment source

## 2020-06-30 ENCOUNTER — Other Ambulatory Visit: Payer: Self-pay

## 2020-06-30 ENCOUNTER — Ambulatory Visit (INDEPENDENT_AMBULATORY_CARE_PROVIDER_SITE_OTHER): Payer: No Typology Code available for payment source | Admitting: Podiatry

## 2020-06-30 DIAGNOSIS — M722 Plantar fascial fibromatosis: Secondary | ICD-10-CM

## 2020-07-01 NOTE — Progress Notes (Signed)
Subjective:   Patient ID: Melissa Blankenship, female   DOB: 39 y.o.   MRN: [id number]   HPI Patient presents stating that she is doing well with mild discomfort swelling but overall is feeling good and able to bear full weight on her foot   ROS      Objective:  Physical Exam  Neurovascular status intact negative Denna Haggard' sign noted wound edges healing well left good alignment noted good range of motion     Assessment:  Doing well post osteotomy left first metatarsal injection     Plan:  H&P x-rays reviewed reapplied sterile dressing continue immobilization elevation compression and begin range of motion exercises reappoint in the next 3 weeks  X-rays indicate that there is good alignment noted fixation in place no signs of pathology

## 2020-07-02 ENCOUNTER — Other Ambulatory Visit: Payer: Self-pay

## 2020-07-02 ENCOUNTER — Ambulatory Visit (INDEPENDENT_AMBULATORY_CARE_PROVIDER_SITE_OTHER): Payer: No Typology Code available for payment source | Admitting: Internal Medicine

## 2020-07-02 ENCOUNTER — Other Ambulatory Visit (HOSPITAL_COMMUNITY): Payer: Self-pay | Admitting: Internal Medicine

## 2020-07-02 ENCOUNTER — Encounter: Payer: Self-pay | Admitting: Internal Medicine

## 2020-07-02 DIAGNOSIS — J069 Acute upper respiratory infection, unspecified: Secondary | ICD-10-CM | POA: Diagnosis not present

## 2020-07-02 HISTORY — DX: Acute upper respiratory infection, unspecified: J06.9

## 2020-07-02 MED ORDER — GUAIFENESIN-CODEINE 200-10 MG/5ML PO LIQD: 5.0000 mL | Freq: Three times a day (TID) | ORAL | 0 refills | Status: DC | PRN

## 2020-07-02 MED FILL — GUAIATUSSIN AC LIQUID: 100-10 | 7 days supply | Qty: 210 | Fill #0

## 2020-07-02 NOTE — Progress Notes (Signed)
  [facility] Internal Medicine Residency Telephone Encounter Continuity Care Appointment  HPI:   This telephone encounter was created for [name] on 07/02/2020 for the following purpose/cc cough, nasal congestion.   Past Medical History:  Past Medical History:  Diagnosis Date  . Anxiety   . Depression   . GERD (gastroesophageal reflux disease)   . Thyroid nodule       ROS:   Positive for fevers, chills, body aches. Negative for chest pain, shortness of breath, GI symptoms.    Assessment / Plan / Recommendations:   Please see A&P under problem oriented charting for assessment of the patient's acute and chronic medical conditions.   As always, pt is advised that if symptoms worsen or new symptoms arise, they should go to an urgent care facility or to to ER for further evaluation.   Consent and Medical Decision Making:   Patient discussed with Dr. Heide Spark  This is a telephone encounter between [employer] and Bridget Hartshorn on 07/02/2020 for nasal congestion, cough. The visit was conducted with the patient located at home and Bridget Hartshorn at [facility]. The patient's identity was confirmed using their DOB and current address. The patient has consented to being evaluated through a telephone encounter and understands the associated risks (an examination cannot be done and the patient may need to come in for an appointment) / benefits (allows the patient to remain at home, decreasing exposure to coronavirus). I personally spent 25 minutes on medical discussion.

## 2020-07-02 NOTE — Assessment & Plan Note (Signed)
Patient calls in with 4 day history of fevers, chills, body aches, nasal congestion and cough. She was treating her symptoms with OTC robitussin DM. She lives with her mother who has also been ill with similar symptoms and tested positive for COVID yesterday when she required admission to the hospital.  Both she and her mom had received both doses of COVID vaccine, but had not received boosters yet. Patient got tested for COVID yesterday evening with results pending.  She denies any light headedness or shortness of breath. She does have a pulse ox that she can monitor her o2 sats periodically with.  Given her history of asthma, she may qualify for outpatient therapy. I have instructed her to call us when she has her test results so she can be referred if interested.  In the mean time, will send in Rx for cough syrup with codeine to help her sleep at night.  She is aware to seek emergent care if her O2 sats drop or she develops significant shortness of breath.

## 2020-07-06 NOTE — Progress Notes (Signed)
Internal Medicine Clinic Attending  Case discussed with Dr. Bloomfield  At the time of the visit.  We reviewed the resident's history and exam and pertinent patient test results.  I agree with the assessment, diagnosis, and plan of care documented in the resident's note.  

## 2020-07-08 ENCOUNTER — Other Ambulatory Visit: Payer: Self-pay

## 2020-07-08 ENCOUNTER — Ambulatory Visit: Payer: No Typology Code available for payment source | Admitting: Behavioral Health

## 2020-07-08 DIAGNOSIS — F329 Major depressive disorder, single episode, unspecified: Secondary | ICD-10-CM

## 2020-07-08 DIAGNOSIS — F411 Generalized anxiety disorder: Secondary | ICD-10-CM

## 2020-07-08 NOTE — BH Specialist Note (Signed)
Integrated Behavioral Health via Telemedicine Visit  07/08/2020 CHARO PHILIPP [id number]  Number of Integrated Behavioral Health visits: 3/6 Session Start time: 10:00am  Session End time: 10:30am Total time: 30  Referring Provider: Dr. Elige Radon, MD Patient/Family location: Pt in private @ home [facility] Provider location: [facility] Office All persons participating in visit: Pt & Clinician Types of Service: Individual psychotherapy  I connected with Rhyleigh N Kohan and/or Korbyn N Pallett's self by Telephone  (Video is Surveyor, mining) and verified that I am speaking with the correct person using two identifiers.Discussed confidentiality: Yes   I discussed the limitations of telemedicine and the availability of in person appointments.  Discussed there is a possibility of technology failure and discussed alternative modes of communication if that failure occurs.  I discussed that engaging in this telemedicine visit, they consent to the provision of behavioral healthcare and the services will be billed under their insurance.  Patient and/or legal guardian expressed understanding and consented to Telemedicine visit: Yes   Presenting Concerns: Patient and/or family reports the following symptoms/concerns: elevated anxiety due to recent foot surgery & her Mother's bout w/COVID-19. Parent was hosp'd for 4 days & has pre-existing conditions that make her more vulnerable. Pt feels the Technician who delivered oxygen to her Mother exposed her. The Tech was sneezing & w/o a mask. He had to be close to her Mother, so she is pretty certain this was the point of contact. Pt had to ask the Tech to put on a mask. Pt expressed anger over this. 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   Pt shared she has d/c'd her Lexapro 20mg . She did not exp any side effects from taking herself off the med cold . She felt different, but hates pills & wants to limit what she  takes to necessity only. Pt related she feels, "in a dark place," but still does not want to take medication. Pt admits she is feeling dep'd, but she is getting good sleep w/use of Melatonin, using her breathing exercises, & talking to herself in a positively spiritual way to feel better. Pt sts, "it is a deep hurt inside to see ppl acting so irresponsible, but I pray for G_d to help [place] through this." Pt is neg for SI/HI.  Pt is keeping her home safe & is appreciative for being able to talk w/Clinician over telehealth bc her anxiety is also high. She has lost some of her trust in ppl since the Pandemic began.  Duration of problem: several yrs; Severity of problem: moderate  Patient and/or Family's Strengths/Protective Factors: Social and Emotional competence, Concrete supports in place (healthy food, safe environments, etc.), Sense of purpose, Parental Resilience and Family connections  Goals Addressed: Patient will: 1.  Reduce symptoms of: anxiety, depression, stress and psychological overwhelm  2.  Increase knowledge and/or ability of: coping skills, stress reduction and anti-anxiety application she can download onto her phone & future concerns for RTW process  3.  Demonstrate ability to: Increase healthy adjustment to current life circumstances, Improve medication compliance, Begin healthy grieving over loss and address Pt Sx with alternative means of Tx using resources for relaxing when panic happens  Progress towards Goals: Ongoing  Interventions: Interventions utilized:  Solution-Focused Strategies, Mindfulness or Relaxation Training and Supportive Counseling Standardized Assessments completed: Not Needed  Patient and/or Family Response: Pt receptive to telehealth call today. We reviewed the events of the past few wks & Pt able to express  her emotions. Pt disclosed she is not a, "talker" so psychotherapy is very helpful. Pt still worried for the health of her Mother-she has lost multiple  relatives to COVID-19. It is a constant worry.   Assessment: Patient currently experiencing elevated anxiety that is not well-controlled. Pt d/c'd her Lexapro 20mg  script a week ago. Counseled Pt on the benefits of Lexapro for her anxiety at this time, but Pt persists in her dislike of taking pills. Pt has Hx of throat surgery in Feb 2019. Swallowing pills makes her anxious. Offered to Pt there are alternatives to pills, such as injectables she can discuss w/her PCP.  Patient may benefit from alternative medications to address her anxiety, cont'd psychotherapy sessions, planning for her RTW to secure her comfort level, & support for Pt faith & spiritual beliefs.  Plan: 1. Follow up with behavioral health clinician on : 2 wks on telehealth for an hour 2. 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 3. Referral(s): Integrated Mar 2019 (In Clinic)  I discussed the assessment and treatment plan with the patient and/or parent/guardian. They were provided an opportunity to ask questions and all were answered. They agreed with the plan and demonstrated an understanding of the instructions.   They were advised to call back or seek an in-person evaluation if the symptoms worsen or if the condition fails to improve as anticipated.  [employer], LMFT

## 2020-07-13 ENCOUNTER — Other Ambulatory Visit (HOSPITAL_COMMUNITY): Payer: Self-pay | Admitting: Radiology

## 2020-07-21 ENCOUNTER — Encounter: Payer: No Typology Code available for payment source | Admitting: Podiatry

## 2020-07-22 ENCOUNTER — Ambulatory Visit (INDEPENDENT_AMBULATORY_CARE_PROVIDER_SITE_OTHER): Payer: No Typology Code available for payment source | Admitting: Podiatry

## 2020-07-22 ENCOUNTER — Ambulatory Visit: Payer: No Typology Code available for payment source | Admitting: Behavioral Health

## 2020-07-22 ENCOUNTER — Other Ambulatory Visit: Payer: Self-pay

## 2020-07-22 ENCOUNTER — Ambulatory Visit (INDEPENDENT_AMBULATORY_CARE_PROVIDER_SITE_OTHER): Payer: No Typology Code available for payment source

## 2020-07-22 ENCOUNTER — Other Ambulatory Visit: Payer: Self-pay | Admitting: Podiatry

## 2020-07-22 ENCOUNTER — Encounter: Payer: Self-pay | Admitting: Podiatry

## 2020-07-22 DIAGNOSIS — M21611 Bunion of right foot: Secondary | ICD-10-CM | POA: Diagnosis not present

## 2020-07-22 DIAGNOSIS — F329 Major depressive disorder, single episode, unspecified: Secondary | ICD-10-CM

## 2020-07-22 DIAGNOSIS — F411 Generalized anxiety disorder: Secondary | ICD-10-CM

## 2020-07-22 DIAGNOSIS — M21612 Bunion of left foot: Secondary | ICD-10-CM

## 2020-07-22 MED ORDER — MELOXICAM 15 MG PO TABS: 15.0000 mg | ORAL_TABLET | Freq: Every day | ORAL | 2 refills | Status: DC

## 2020-07-22 MED FILL — MELOXICAM 15 MG TABLET: 15 | 30 days supply | Qty: 30 | Fill #0

## 2020-07-22 NOTE — BH Specialist Note (Signed)
Integrated Behavioral Health via Telemedicine Visit  07/22/2020 Melissa Blankenship [id number]  Number of Integrated Behavioral Health visits: 4/6 Session Start time: 2:00pm  Session End time: 2:45pm Total time: 45   Referring Provider: Dr. Elige Radon, MD Patient/Family location: Pt in private [facility] Provider location: [facility] Office All persons participating in visit: Pt & Clinician Types of Service: Individual psychotherapy  I connected with Melissa Blankenship and/or Melissa Blankenship's self by Telephone  (Video is Surveyor, mining) and verified that I am speaking with the correct person using two identifiers.Discussed confidentiality: Yes   I discussed the limitations of telemedicine and the availability of in person appointments.  Discussed there is a possibility of technology failure and discussed alternative modes of communication if that failure occurs.  I discussed that engaging in this telemedicine visit, they consent to the provision of behavioral healthcare and the services will be billed under their insurance.  Patient and/or legal guardian expressed understanding and consented to Telemedicine visit: Yes   Presenting Concerns: Patient and/or family reports the following symptoms/concerns: pain from recent foot surgery & evolving recovery Duration of problem: several months; Severity of problem: moderate  Patient and/or Family's Strengths/Protective Factors: Social connections, Social and Emotional competence, Concrete supports in place (healthy food, safe environments, etc.), Sense of purpose and Physical Health (exercise, healthy diet, medication compliance, etc.)  Goals Addressed: Patient will: 1.  Reduce symptoms of: anxiety, depression and stress  2.  Increase knowledge and/or ability of: stress reduction  3.  Demonstrate ability to: Increase healthy adjustment to current life circumstances  Progress towards Goals: Ongoing  Interventions: Interventions  utilized:  Solution-Focused Strategies and Supportive Counseling Standardized Assessments completed: Not Needed  Patient and/or Family Response: Pt responsive to visit today. Pt is feeling better today. Her pain levels are manageable. She is pushing herself to try to walk on her foot & to get things back to normal. Pt is dealing w/caring for her Mother who just had an unexpected J-tube replacement. Pt is managing her Mother's pain control & finds it hard to see her in pain. Pt feels positive today; her children have pulled in more to help.  Assessment: Patient currently experiencing improving levels of anxiety.   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   Plan: 1. Follow up with behavioral health clinician on : 30 min check-in in 2-3 weeks on telehealth 2. Behavioral recommendations: Cont to work towards full weight bearing per the Orthopedist's orders, keeping in mind she knows her body best. Try to fit in some self-care so you can fortify yourself from stress. Read devotions to keep you strong & journal as you are able. 3. Referral(s): Integrated [employer] (In Clinic)  I discussed the assessment and treatment plan with the patient and/or parent/guardian. They were provided an opportunity to ask questions and all were answered. They agreed with the plan and demonstrated an understanding of the instructions.   They were advised to call back or seek an in-person evaluation if the symptoms worsen or if the condition fails to improve as anticipated.  Melissa Lever, LMFT

## 2020-07-22 NOTE — Progress Notes (Signed)
Subjective:   Patient ID: Melissa Blankenship, female   DOB: 40 y.o.   MRN: [id number]   HPI Patient states doing very well with surgery very pleased at this point   ROS      Objective:  Physical Exam  Neurovascular status intact negative Denna Haggard' sign noted wound edges well coapted hallux rectus position range of motion adequate    Assessment:  Doing well post osteotomy first metatarsal left     Plan:  Reviewed the continuation of low-grade immobilization and physical therapy elevation and compression.  Reappoint 6 weeks or earlier if needed  X-rays indicate osteotomies healing well fixation in place joint congruence

## 2020-07-23 ENCOUNTER — Telehealth: Payer: Self-pay | Admitting: Podiatry

## 2020-07-23 NOTE — Telephone Encounter (Signed)
Patient would like to rtw 08/17/2020, without any restrictions. Please advise?

## 2020-07-26 ENCOUNTER — Encounter: Payer: Self-pay | Admitting: Podiatry

## 2020-07-26 NOTE — Telephone Encounter (Signed)
That's fine

## 2020-07-29 ENCOUNTER — Other Ambulatory Visit: Payer: Self-pay

## 2020-07-29 ENCOUNTER — Encounter: Payer: Self-pay | Admitting: Sports Medicine

## 2020-07-29 ENCOUNTER — Ambulatory Visit (INDEPENDENT_AMBULATORY_CARE_PROVIDER_SITE_OTHER): Payer: No Typology Code available for payment source | Admitting: Sports Medicine

## 2020-07-29 DIAGNOSIS — M79672 Pain in left foot: Secondary | ICD-10-CM

## 2020-07-29 DIAGNOSIS — Z9889 Other specified postprocedural states: Secondary | ICD-10-CM

## 2020-07-29 DIAGNOSIS — M21619 Bunion of unspecified foot: Secondary | ICD-10-CM | POA: Diagnosis not present

## 2020-07-29 NOTE — Progress Notes (Signed)
Subjective: Melissa Blankenship is a 40 y.o. female patient seen today in office for follow up post op care. Patient is a patient of Dr. Charlsie Merles who underwent left austin bunionectomy on 06/22/20. Patient reports that she is still having a lot of pain and can not wear a normal shoe. There is moderate pain underneath big toe joint with sharp pains over the 1st toe medial aspect and pain at the incision. Patent reports that she was not aware that she would have pins and the extent of her surgery. Patient denies any other symptoms or concerns at this time.  Patient Active Problem List   Diagnosis Date Noted  . Viral upper respiratory tract infection 07/02/2020  . Health care maintenance 06/01/2020  . Bunion 05/14/2020  . GAD (generalized anxiety disorder) with panic attacks 01/09/2019  . Asthma 01/09/2019  . Thyroid nodule 12/12/2018    Current Outpatient Medications on File Prior to Visit  Medication Sig Dispense Refill  . albuterol (VENTOLIN HFA) 108 (90 Base) MCG/ACT inhaler Inhale 2 puffs into the lungs every 6 (six) hours as needed for wheezing or shortness of breath. 8 g 1  . ALPRAZolam (XANAX) 0.25 MG tablet Take 1-2 tablets (0.25-0.5 mg total) by mouth 3 (three) times daily as needed for anxiety. 90 tablet 0  . cyclobenzaprine (FLEXERIL) 5 MG tablet Take 1 tablet (5 mg total) by mouth 3 (three) times daily as needed for muscle spasms. 30 tablet 0  . escitalopram (LEXAPRO) 20 MG tablet Take 1 tablet (20 mg total) by mouth daily. 30 tablet 3  . guaiFENesin-Codeine 200-10 MG/5ML LIQD Take 5-10 mLs by mouth 3 (three) times daily as needed. 210 mL 0  . meloxicam (MOBIC) 15 MG tablet Take 1 tablet (15 mg total) by mouth daily. 30 tablet 2  . ondansetron (ZOFRAN) 4 MG tablet Take 1 tablet (4 mg total) by mouth every 8 (eight) hours as needed for nausea or vomiting. 20 tablet 0  . oxyCODONE-acetaminophen (PERCOCET) 10-325 MG tablet Take 1 tablet by mouth every 4 (four) hours as needed for pain. 25  tablet 0  . traZODone (DESYREL) 50 MG tablet Take 2 tablets (100 mg total) by mouth at bedtime. 60 tablet 1   No current facility-administered medications on file prior to visit.    No Known Allergies  Objective: There were no vitals filed for this visit.  General: No acute distress, AAOx3  Left foot: Incision healing well with no gapping or dehiscence at surgical site, mild swelling to 1st ray, no erythema, no warmth, no drainage, no acute signs of infection noted, Capillary fill time <3 seconds in all digits, gross sensation present via light touch to left foot except with numbness over incision. + pain with range of motion left 1st MTPJ and with direct palpation to tibial sesamoid area.  No pain with calf compression.    Assessment and Plan:  Problem List Items Addressed This Visit      Musculoskeletal and Integument   Bunion - Primary (Chronic)   Relevant Orders   Ambulatory referral to Physical Therapy    Other Visit Diagnoses    Left foot pain       Relevant Orders   Ambulatory referral to Physical Therapy   S/P foot surgery, left       Relevant Orders   Ambulatory referral to Physical Therapy        -Patient seen and evaluated -Previous xrays reviewed  -Discussed with patient post op course and common symptoms s/p bunionectomy  -  Advised patient to continue with cocs as tolerated if pain increases may return to using cam boot or surgical shoe -Advised patient to limit activity to tolerance and to continue with tylenol and motrin and icing in the evening  -Advised patient to use scar cream to surgical incision after shower -Rx PT for mobility and pain reduction -Advised patient that we will re-assess her ability to return to work at next office visit -Will plan for xrays and discuss RTW at next office visit in 2 weeks.   Asencion Islam, DPM

## 2020-08-10 ENCOUNTER — Other Ambulatory Visit: Payer: Self-pay

## 2020-08-10 ENCOUNTER — Ambulatory Visit: Payer: No Typology Code available for payment source | Admitting: Behavioral Health

## 2020-08-10 DIAGNOSIS — F411 Generalized anxiety disorder: Secondary | ICD-10-CM

## 2020-08-10 NOTE — BH Specialist Note (Signed)
Integrated Behavioral Health via Telemedicine Visit  08/10/2020 JENNESS STEMLER [id number]  Number of Integrated Behavioral Health visits: 5/6 Session Start time: 3:00pm  Session End time: 3:30pm Total time: 30  Referring Provider: Dr. Elige Radon, MD Patient/Family location: Pt at home in private [facility] Provider location: [facility] Office All persons participating in visit: Pt & Clinician Types of Service: Individual psychotherapy  I connected with Torey N Shough and/or Abia N Ensminger's self by Telephone  (Video is Surveyor, mining) and verified that I am speaking with the correct person using two identifiers.Discussed confidentiality: Yes   I discussed the limitations of telemedicine and the availability of in person appointments.  Discussed there is a possibility of technology failure and discussed alternative modes of communication if that failure occurs.  I discussed that engaging in this telemedicine visit, they consent to the provision of behavioral healthcare and the services will be billed under their insurance.  Patient and/or legal guardian expressed understanding and consented to Telemedicine visit: Yes   Presenting Concerns: Patient and/or family reports the following symptoms/concerns: reduced anxiety Duration of problem: weeks; Severity of problem: mild  Patient and/or Family's Strengths/Protective Factors: Social connections, Social and Emotional competence, Sense of purpose and Physical Health (exercise, healthy diet, medication compliance, etc.)  Goals Addressed: Patient will: 1.  Reduce symptoms of: anxiety, depression and stress  2.  Increase knowledge and/or ability of: coping skills and stress reduction  3.  Demonstrate ability to: Increase healthy adjustment to current life circumstances  Progress towards Goals: Ongoing  Interventions: Interventions utilized:  Solution-Focused Strategies and Supportive Counseling Standardized Assessments  completed: Not Needed  Patient and/or Family Response: Pt receptive today & wants to cont sessions  Assessment: Patient currently experiencing reduced anxiety levels.   Patient may benefit from cont'd psychotherapy for reduction of Sx.  Plan: 1. Follow up with behavioral health clinician on : 3 wks out on telehealth for 30 min 2. Behavioral recommendations: Inc self-care practices, use notebook to keep focused 3. Referral(s): Integrated [employer] (In Clinic)  I discussed the assessment and treatment plan with the patient and/or parent/guardian. They were provided an opportunity to ask questions and all were answered. They agreed with the plan and demonstrated an understanding of the instructions.   They were advised to call back or seek an in-person evaluation if the symptoms worsen or if the condition fails to improve as anticipated.  Deneise Lever, LMFT

## 2020-08-12 ENCOUNTER — Encounter: Payer: Self-pay | Admitting: Sports Medicine

## 2020-08-12 ENCOUNTER — Ambulatory Visit (INDEPENDENT_AMBULATORY_CARE_PROVIDER_SITE_OTHER): Payer: No Typology Code available for payment source

## 2020-08-12 ENCOUNTER — Ambulatory Visit (INDEPENDENT_AMBULATORY_CARE_PROVIDER_SITE_OTHER): Payer: No Typology Code available for payment source | Admitting: Sports Medicine

## 2020-08-12 ENCOUNTER — Other Ambulatory Visit: Payer: Self-pay

## 2020-08-12 DIAGNOSIS — M21619 Bunion of unspecified foot: Secondary | ICD-10-CM

## 2020-08-12 DIAGNOSIS — Z9889 Other specified postprocedural states: Secondary | ICD-10-CM

## 2020-08-12 DIAGNOSIS — M21612 Bunion of left foot: Secondary | ICD-10-CM | POA: Diagnosis not present

## 2020-08-12 DIAGNOSIS — M79672 Pain in left foot: Secondary | ICD-10-CM

## 2020-08-12 NOTE — Progress Notes (Signed)
Subjective: Melissa Blankenship is a 40 y.o. female patient seen today in office for follow up post op care. Patient is a patient of Dr. Charlsie Merles who underwent left austin bunionectomy on 06/22/20 as previously noted. Patient reports that she having burning pain that seems like it is getting worse as well as still limping and has pain when she goes to put full pressure has been alternating shoes to accommodate for this and sometimes using her cam boot.  Patient reports that she starts physical therapy on tomorrow and is scheduled to return to work on next week on 3/9.  Patient Active Problem List   Diagnosis Date Noted  . Viral upper respiratory tract infection 07/02/2020  . Health care maintenance 06/01/2020  . Bunion 05/14/2020  . GAD (generalized anxiety disorder) with panic attacks 01/09/2019  . Asthma 01/09/2019  . Thyroid nodule 12/12/2018    Current Outpatient Medications on File Prior to Visit  Medication Sig Dispense Refill  . albuterol (VENTOLIN HFA) 108 (90 Base) MCG/ACT inhaler Inhale 2 puffs into the lungs every 6 (six) hours as needed for wheezing or shortness of breath. 8 g 1  . ALPRAZolam (XANAX) 0.25 MG tablet Take 1-2 tablets (0.25-0.5 mg total) by mouth 3 (three) times daily as needed for anxiety. 90 tablet 0  . cyclobenzaprine (FLEXERIL) 5 MG tablet Take 1 tablet (5 mg total) by mouth 3 (three) times daily as needed for muscle spasms. 30 tablet 0  . escitalopram (LEXAPRO) 20 MG tablet Take 1 tablet (20 mg total) by mouth daily. 30 tablet 3  . guaiFENesin-Codeine 200-10 MG/5ML LIQD Take 5-10 mLs by mouth 3 (three) times daily as needed. 210 mL 0  . meloxicam (MOBIC) 15 MG tablet Take 1 tablet (15 mg total) by mouth daily. 30 tablet 2  . ondansetron (ZOFRAN) 4 MG tablet Take 1 tablet (4 mg total) by mouth every 8 (eight) hours as needed for nausea or vomiting. 20 tablet 0  . oxyCODONE-acetaminophen (PERCOCET) 10-325 MG tablet Take 1 tablet by mouth every 4 (four) hours as needed  for pain. 25 tablet 0  . traZODone (DESYREL) 50 MG tablet Take 2 tablets (100 mg total) by mouth at bedtime. 60 tablet 1   No current facility-administered medications on file prior to visit.    No Known Allergies  Objective: There were no vitals filed for this visit.  General: No acute distress, AAOx3  Left foot: Incision well-healed with no gapping or dehiscence at surgical site, mild swelling to 1st ray, there is a small fissure and dry cracking skin noted to the plantar first ray, no erythema, no warmth, no drainage, no acute signs of infection noted, Capillary fill time <3 seconds in all digits, gross sensation present via light touch to left foot except with numbness over incision and some sharp shooting pains. + pain with range of motion left 1st MTPJ and with direct palpation to tibial sesamoid area like before.  No pain with calf compression.   X-rays consistent with postoperative status.   Assessment and Plan:  Problem List Items Addressed This Visit      Musculoskeletal and Integument   Bunion - Primary (Chronic)   Relevant Orders   DG Foot Complete Left    Other Visit Diagnoses    Left foot pain       S/P foot surgery, left            -Patient seen and evaluated -X-rays reviewed -Discussed with patient plan of care and normal postoperative  course -Patient to start physical therapy on tomorrow -Advised patient to use to area of cracking skin Neosporin pain relief if this does not resolve issue on next week may call for gabapentin low-dose at bedtime to help with any nerve problems/pain -Advised patient to limit activity to tolerance and to continue with tylenol and motrin and icing in the evening like previous -Advised patient to use scar cream to surgical incision after shower like previous to the dorsal incision -Return to work on 3/9 light duty limited walking and standing to a max of 6 hours per shift, maximum pushing pulling lifting of 75 pounds -Will plan for  postoperative check at next office visit in 2 weeks.   Asencion Islam, DPM

## 2020-08-13 ENCOUNTER — Encounter: Payer: Self-pay | Admitting: Physical Therapy

## 2020-08-13 ENCOUNTER — Ambulatory Visit: Payer: No Typology Code available for payment source | Attending: Sports Medicine | Admitting: Physical Therapy

## 2020-08-13 ENCOUNTER — Other Ambulatory Visit: Payer: Self-pay

## 2020-08-13 DIAGNOSIS — M25572 Pain in left ankle and joints of left foot: Secondary | ICD-10-CM | POA: Diagnosis not present

## 2020-08-13 DIAGNOSIS — M6281 Muscle weakness (generalized): Secondary | ICD-10-CM

## 2020-08-13 DIAGNOSIS — R2689 Other abnormalities of gait and mobility: Secondary | ICD-10-CM | POA: Diagnosis present

## 2020-08-13 DIAGNOSIS — M25672 Stiffness of left ankle, not elsewhere classified: Secondary | ICD-10-CM | POA: Diagnosis present

## 2020-08-13 NOTE — Therapy (Signed)
[facility] Outpatient Rehabilitation [facility] [address] [place], [place], [address] Phone: [phone]   Fax:  [phone]  Physical Therapy Evaluation  Patient Details  Name: Melissa Blankenship MRN: [id number] Date of Birth: 01/09/1981 Referring Provider (PT): Asencion Islam, [place]   Encounter Date: 08/13/2020   PT End of Session - 08/13/20 2353    Visit Number 1    Number of Visits 13    Date for PT Re-Evaluation 10/08/20    Authorization Type FOCUS with [employer]  - follow [facility]    PT Start Time 0955    PT Stop Time 1040    PT Time Calculation (min) 45 min    Activity Tolerance Patient tolerated treatment well    Behavior During Therapy [facility] for tasks assessed/performed           Past Medical History:  Diagnosis Date  . Anxiety   . Depression   . GERD (gastroesophageal reflux disease)   . Thyroid nodule     Past Surgical History:  Procedure Laterality Date  . ABDOMINAL HYSTERECTOMY    . BUNIONECTOMY Left 06/22/2020  . INCISION AND DRAINAGE / EXCISION THYROGLOSSAL CYST  2019   path report revealed hyperplastic thyroid tissue, non-malignant   . TUBAL LIGATION      There were no vitals filed for this visit.    Subjective Assessment - 08/13/20 1003    Subjective pt is 40 y.o s/p L foot bunionectomy on 06/22/2020. she reports since surgery it is still sore especially with standing/ walking. she reports she was told alternate wearing her boot/ shoe. but when working to where her boot. pain occurs in the big toe and into the bottom of the foot. She reports intermittent N/T bottom of the L foot.    Limitations Standing    How long can you sit comfortably? unlimited    How long can you stand comfortably? 20 min    How long can you walk comfortably? 5-6 min    Diagnostic tests x-ray on 08/12/2020   X-rays consistent with postoperative status.    Patient Stated Goals to decrease pain, become functional    Currently in Pain? Yes    Pain Score  6    worst 10/10   Pain Location Foot    Pain Orientation Left   plantar surface   Pain Descriptors / Indicators Aching    Pain Type Surgical pain    Pain Onset More than a month ago    Pain Frequency Constant    Aggravating Factors  standing/ walking,    Pain Relieving Factors ice pack, ibuprofen    Effect of Pain on Daily Activities standing/ walking limited endurance              [facility] PT Assessment - 08/13/20 0001      Assessment   Medical Diagnosis Bunion (M21.619), Plantar fasciitis (M72.2), Left foot pain (I14.431), S/P foot surgery, left (V40.086)    Referring Provider (PT) Asencion Islam, DPM    Onset Date/Surgical Date 06/22/20    Hand Dominance Right    Next MD Visit 08/26/2020    Prior Therapy no      Precautions   Precaution Comments no lifting over 75#, no pushing/ pullingbending, no standing over 6 hours      Restrictions   Weight Bearing Restrictions No      Balance Screen   Has the patient fallen in the past 6 months No      Home Environment   Living  Environment Private residence    Living Arrangements Parent    Available Help at Discharge Family    Type of Home Apartment    Home Access Stairs to enter    Entrance Stairs-Number of Steps 3    Entrance Stairs-Rails Right   asending   Home Layout One level    Home Equipment Walker - 2 wheels;Cane - single point;Crutches   boot     Prior Function   Level of Independence Independent with basic ADLs    Vocation Full time employment   CNA   Vocation Requirements lifting, stnading, walking, pushing/ pulling      Cognition   Overall Cognitive Status Within Functional Limits for tasks assessed      Observation/Other Assessments   Focus on Therapeutic Outcomes (FOTO)  28"% function   61% function predicted     ROM / Strength   AROM / PROM / Strength AROM;Strength;PROM      AROM   Overall AROM Comments great toe no AROM due pain    AROM Assessment Site Ankle    Right/Left Ankle Right;Left    Right  Ankle Dorsiflexion 10    Right Ankle Plantar Flexion 60    Left Ankle Dorsiflexion -10    Left Ankle Plantar Flexion 35    Left Ankle Inversion 20    Left Ankle Eversion 10      PROM   Overall PROM Comments 15 degres flexion, 20 extension    PROM Assessment Site Ankle    Right/Left Ankle Left    Left Ankle Dorsiflexion 0    Left Ankle Plantar Flexion 42    Left Ankle Inversion 20    Left Ankle Eversion 10      Strength   Overall Strength Comments pain limiting L ankle strengtheing    Strength Assessment Site Ankle    Right/Left Ankle Right;Left    Right Ankle Dorsiflexion 4+/5    Right Ankle Plantar Flexion 4+/5    Right Ankle Inversion 4+/5    Right Ankle Eversion 4+/5    Left Ankle Dorsiflexion 3/5    Left Ankle Plantar Flexion 3/5    Left Ankle Inversion 3/5    Left Ankle Eversion 3/5      Palpation   Palpation comment TTP along the dorsum/ plantar and medial aspect of the L foot.  Hypersensitivty noted along the incision      Ambulation/Gait   Ambulation/Gait Yes    Gait Pattern Decreased stride length;Decreased stance time - left;Decreased step length - right;Step-through pattern;Antalgic;Trendelenburg                      Objective measurements completed on examination: See above findings.               PT Education - 08/13/20 1011    Education Details evaluation findings, POC, goals, HEP with proper form/ rationale. Anatomy of area involved, FOTO assessment    Person(s) Educated Patient    Methods Explanation;Verbal cues;Handout    Comprehension Verbalized understanding;Verbal cues required            PT Short Term Goals - 08/13/20 1053      PT SHORT TERM GOAL #1   Title pt to be IND with inital HEP    Baseline no previous HEp    Time 3    Period Weeks    Status New    Target Date 09/03/20      PT SHORT TERM GOAL #2   Title  increase L ankle DF to >/= 0 degree to demo improvement in ankle Mobility    Baseline see flowsheet     Time 3    Period Weeks    Status New    Target Date 09/03/20      PT SHORT TERM GOAL #3   Title pt to report pain to </= 5/10 max pain for therapuetic progression    Baseline worst pain is 10/10    Time 3    Period Weeks    Status New    Target Date 09/03/20             PT Long Term Goals - 08/13/20 1055      PT LONG TERM GOAL #1   Title increase L ankle DF to >/= 8 degrees and PF to >/= 50 degrees to promote ROm required for efficient gait pattern    Baseline see flow sheet    Time 6    Period Weeks    Status New    Target Date 10/08/20      PT LONG TERM GOAL #2   Title increase gross ankle strength to >/= 4+/5 to promote ankle stability with walking/ standing    Baseline see flowsheet    Time 6    Period Weeks    Status New    Target Date 10/08/20      PT LONG TERM GOAL #3   Title pt to be able to stand / walk for >/= 45 min demonstrating efficient pattern  with </= 3/10 max pain for functional endurance rquired for work related activities.    Baseline able to walk 5-6 min, walk 15-20 min with max 10/10 pain    Time 6    Period Weeks    Status New    Target Date 10/08/20      PT LONG TERM GOAL #4   Title increase FOTO scoreo to >/= 61% to demo improvement in function    Baseline inital score 28%    Time 6    Period Weeks    Status New    Target Date 10/08/20      PT LONG TERM GOAL #5   Title pt to be IND with all HEP given and is able to maintain and progress current LOF    Baseline no previous HEP    Time 6    Period Weeks    Status New    Target Date 10/08/20                  Plan - 08/13/20 1045    Clinical Impression Statement pt is a pleasant 40 y.o F presenting to OPPT s/p L bunionectomy on 06/22/2020. She demosntrates limited ankle and 1st digit ROM, with associated weakness noted. she demonstates increased fear of activity limited gait and general activity. TTP along the dorsum/ plantar and medial aspect of the L foot. she would  benefit from physical therapy to increase L ankle and great toe ROM, improve gross strength, promote gait efficency and return to PLOF by addressing the deficits listed.    Personal Factors and Comorbidities Comorbidity 3+;Comorbidity 2    Comorbidities hx of anxiety, depression    Examination-Activity Limitations Locomotion Level;Stand    Stability/Clinical Decision Making Evolving/Moderate complexity    Clinical Decision Making Moderate    Rehab Potential Good    PT Frequency 2x / week    PT Duration 6 weeks    PT Treatment/Interventions ADLs/Self Care Home Management;Cryotherapy;Electrical Stimulation;Iontophoresis 4mg /ml  Dexamethasone;Moist Heat;Ultrasound;Stair training;Gait training;Functional mobility training;Therapeutic activities;Therapeutic exercise;Balance training;Neuromuscular re-education;Patient/family education;Manual techniques;Scar mobilization;Passive range of motion;Taping;Vasopneumatic Device;Dry needling    PT Next Visit Plan review/ update HEP, apprehensive to pain, desensitization,  PNE, pre-gait training, ankle ROM and strengthening, modalities PRN    PT Home Exercise Plan [id number] - towel scrunch, towel inversion/ eversion, seated heel / toe raise,weight shifting laterally and forward,    Consulted and Agree with Plan of Care Patient           Patient will benefit from skilled therapeutic intervention in order to improve the following deficits and impairments:  Improper body mechanics,Increased muscle spasms,Decreased strength,Postural dysfunction,Pain,Decreased activity tolerance,Decreased balance,Abnormal gait,Decreased range of motion,Decreased endurance  Visit Diagnosis: Pain in left ankle and joints of left foot  Muscle weakness (generalized)  Stiffness of left ankle, not elsewhere classified  Other abnormalities of gait and mobility     Problem List Patient Active Problem List   Diagnosis Date Noted  . Viral upper respiratory tract infection  07/02/2020  . Health care maintenance 06/01/2020  . Bunion 05/14/2020  . GAD (generalized anxiety disorder) with panic attacks 01/09/2019  . Asthma 01/09/2019  . Thyroid nodule 12/12/2018    Melissa Blankenship PT, DPT, LAT, ATC  08/13/20  11:45 AM      [facility] [address] [place], [place], [address] Phone: [phone]   Fax:  [phone]  Name: Melissa Blankenship MRN: [id number] Date of Birth: 05/03/1981     Check all possible CPT codes: 97110- Therapeutic Exercise, [phone]- Neuro Re-education, [phone] - Gait Training, [id number] - Manual Therapy, 97530 - Therapeutic Activities, [phone] - Self Care, 97014 - Electrical stimulation (unattended), [id number] - Iontophoresis, [id number] - Ultrasound and [id number] - Physical performance training

## 2020-08-17 ENCOUNTER — Other Ambulatory Visit: Payer: Self-pay

## 2020-08-17 ENCOUNTER — Ambulatory Visit: Payer: No Typology Code available for payment source | Admitting: Physical Therapy

## 2020-08-17 DIAGNOSIS — M6281 Muscle weakness (generalized): Secondary | ICD-10-CM

## 2020-08-17 DIAGNOSIS — M25572 Pain in left ankle and joints of left foot: Secondary | ICD-10-CM

## 2020-08-17 DIAGNOSIS — M25672 Stiffness of left ankle, not elsewhere classified: Secondary | ICD-10-CM

## 2020-08-17 DIAGNOSIS — R2689 Other abnormalities of gait and mobility: Secondary | ICD-10-CM

## 2020-08-17 NOTE — Therapy (Signed)
[facility] Outpatient Rehabilitation [facility] [address] [place], [place], [address] Phone: [phone]   Fax:  [phone]  Physical Therapy Treatment  Patient Details  Name: Melissa Blankenship MRN: [id number] Date of Birth: [birth year] Referring Provider (PT): Asencion Islam, [place]   Encounter Date: 08/17/2020   PT End of Session - 08/17/20 1419    Visit Number 2    Number of Visits 13    Date for PT Re-Evaluation 10/08/20    Authorization Type FOCUS with [employer]  - follow [employer]    Authorization Time Period pending auth    PT Start Time 1418    PT Stop Time 1457    PT Time Calculation (min) 39 min    Activity Tolerance Patient tolerated treatment well    Behavior During Therapy [facility] for tasks assessed/performed           Past Medical History:  Diagnosis Date  . Anxiety   . Depression   . GERD (gastroesophageal reflux disease)   . Thyroid nodule     Past Surgical History:  Procedure Laterality Date  . ABDOMINAL HYSTERECTOMY    . BUNIONECTOMY Left 06/22/2020  . INCISION AND DRAINAGE / EXCISION THYROGLOSSAL CYST  2019   path report revealed hyperplastic thyroid tissue, non-malignant   . TUBAL LIGATION      There were no vitals filed for this visit.   Subjective Assessment - 08/17/20 1421    Subjective " I've been doing the exercises, I feel like i    Currently in Pain? Yes    Pain Score 7     Pain Location Foot    Pain Orientation Left    Pain Descriptors / Indicators Aching    Pain Type Surgical pain    Pain Onset More than a month ago    Pain Frequency Intermittent              [facility] PT Assessment - 08/17/20 0001      Assessment   Medical Diagnosis Bunion (M21.619), Plantar fasciitis (M72.2), Left foot pain (J00.938), S/P foot surgery, left (H82.993)    Referring Provider (PT) Asencion Islam, DPM    Onset Date/Surgical Date 06/22/20                         [facility] Adult PT Treatment/Exercise -  08/17/20 0001      Ankle Exercises: Stretches   Gastroc Stretch 2 reps;30 seconds   seated with strap     Ankle Exercises: Standing   Other Standing Ankle Exercises forward rocking with ankle DF on lead leg, and PF on trail leg 2 x 10    Other Standing Ankle Exercises heel strike/ toe off walking emphasis pressing through great toe 4 x 50 ft      Ankle Exercises: Seated   Towel Inversion/Eversion 4 reps    [place] Pickup x1   cues throughout activity it is ok to move.   Heel Raises Both;10 reps   x 2   Toe Raise 10 reps   x 2 bil                 PT Education - 08/17/20 1444    Education Details Reviewed HEp and that pain doesn't mean damage and reviewed heel strike/ toe off gait    Person(s) Educated Patient    Methods Explanation;Verbal cues    Comprehension Verbalized understanding;Verbal cues required            PT  Short Term Goals - 08/13/20 1053      PT SHORT TERM GOAL #1   Title pt to be IND with inital HEP    Baseline no previous HEp    Time 3    Period Weeks    Status New    Target Date 09/03/20      PT SHORT TERM GOAL #2   Title increase L ankle DF to >/= 0 degree to demo improvement in ankle Mobility    Baseline see flowsheet    Time 3    Period Weeks    Status New    Target Date 09/03/20      PT SHORT TERM GOAL #3   Title pt to report pain to </= 5/10 max pain for therapuetic progression    Baseline worst pain is 10/10    Time 3    Period Weeks    Status New    Target Date 09/03/20             PT Long Term Goals - 08/13/20 1055      PT LONG TERM GOAL #1   Title increase L ankle DF to >/= 8 degrees and PF to >/= 50 degrees to promote ROm required for efficient gait pattern    Baseline see flow sheet    Time 6    Period Weeks    Status New    Target Date 10/08/20      PT LONG TERM GOAL #2   Title increase gross ankle strength to >/= 4+/5 to promote ankle stability with walking/ standing    Baseline see flowsheet    Time 6     Period Weeks    Status New    Target Date 10/08/20      PT LONG TERM GOAL #3   Title pt to be able to stand / walk for >/= 45 min demonstrating efficient pattern  with </= 3/10 max pain for functional endurance rquired for work related activities.    Baseline able to walk 5-6 min, walk 15-20 min with max 10/10 pain    Time 6    Period Weeks    Status New    Target Date 10/08/20      PT LONG TERM GOAL #4   Title increase FOTO scoreo to >/= 61% to demo improvement in function    Baseline inital score 28%    Time 6    Period Weeks    Status New    Target Date 10/08/20      PT LONG TERM GOAL #5   Title pt to be IND with all HEP given and is able to maintain and progress current LOF    Baseline no previous HEP    Time 6    Period Weeks    Status New    Target Date 10/08/20                 Plan - 08/17/20 1431    Clinical Impression Statement pt continues to demonstrate a high level of pain catastrophizing and requres frequence cues about PNE that pain doesn't mean damage. continued working on foot intrinsics, and ankle strengthening. continued working on weight shifting with emphasis on pressing through the great toe when pushing of or with PF. practiced gait training with heel strike/ toe off taking small steps and emphasis to press through great toe, she did well with gait requiring intermittent cues for form. reported decreased pain following session to 3/10.  PT Treatment/Interventions ADLs/Self Care Home Management;Cryotherapy;Electrical Stimulation;Iontophoresis 4mg /ml Dexamethasone;Moist Heat;Ultrasound;Stair training;Gait training;Functional mobility training;Therapeutic activities;Therapeutic exercise;Balance training;Neuromuscular re-education;Patient/family education;Manual techniques;Scar mobilization;Passive range of motion;Taping;Vasopneumatic Device;Dry needling    PT Next Visit Plan review/ update HEP, apprehensive to pain, desensitization,  PNE, pre-gait  training, ankle ROM and strengthening, modalities PRN    PT Home Exercise Plan - towel scrunch, towel inversion/ eversion, seated heel / toe raise,weight shifting laterally and forward,    Consulted and Agree with Plan of Care Patient           Patient will benefit from skilled therapeutic intervention in order to improve the following deficits and impairments:  Improper body mechanics,Increased muscle spasms,Decreased strength,Postural dysfunction,Pain,Decreased activity tolerance,Decreased balance,Abnormal gait,Decreased range of motion,Decreased endurance  Visit Diagnosis: Pain in left ankle and joints of left foot  Muscle weakness (generalized)  Stiffness of left ankle, not elsewhere classified  Other abnormalities of gait and mobility     Problem List Patient Active Problem List   Diagnosis Date Noted  . Viral upper respiratory tract infection 07/02/2020  . Health care maintenance 06/01/2020  . Bunion 05/14/2020  . GAD (generalized anxiety disorder) with panic attacks 01/09/2019  . Asthma 01/09/2019  . Thyroid nodule 12/12/2018   02/12/2019 PT, DPT, LAT, ATC  08/17/20  3:03 PM      [facility] Out[facility] Rehabilitation [facility] [address], [place], [place] Phone: [phone]   Fax:  [phone]  Name: Melissa Blankenship MRN: Berneice Gandy Date of Birth: [birth year]

## 2020-08-19 ENCOUNTER — Encounter: Payer: No Typology Code available for payment source | Admitting: Podiatry

## 2020-08-20 ENCOUNTER — Ambulatory Visit: Payer: No Typology Code available for payment source | Admitting: Physical Therapy

## 2020-08-26 ENCOUNTER — Ambulatory Visit (INDEPENDENT_AMBULATORY_CARE_PROVIDER_SITE_OTHER): Payer: No Typology Code available for payment source | Admitting: Sports Medicine

## 2020-08-26 ENCOUNTER — Encounter: Payer: Self-pay | Admitting: Sports Medicine

## 2020-08-26 ENCOUNTER — Other Ambulatory Visit: Payer: Self-pay

## 2020-08-26 DIAGNOSIS — M79672 Pain in left foot: Secondary | ICD-10-CM

## 2020-08-26 DIAGNOSIS — M21619 Bunion of unspecified foot: Secondary | ICD-10-CM

## 2020-08-26 DIAGNOSIS — Z9889 Other specified postprocedural states: Secondary | ICD-10-CM

## 2020-08-26 NOTE — Progress Notes (Signed)
Subjective: Melissa Blankenship is a 40 y.o. female patient seen today in office for follow up post op care. Patient is a patient of Dr. Charlsie Merles who underwent left [place] bunionectomy on 06/22/20.  Patient is in physical therapy and reports that physical therapy is helping however still experiencing some residual soreness and lack of motion and still cannot wear a normal shoe however she feels like she is getting a little bit more better with her gait and walking but still has concern for pain.  Reports with being back at work and working consecutive days back to back with pain also has increased but he gets better with rest.   Patient Active Problem List   Diagnosis Date Noted  . Viral upper respiratory tract infection 07/02/2020  . Health care maintenance 06/01/2020  . Bunion 05/14/2020  . GAD (generalized anxiety disorder) with panic attacks 01/09/2019  . Asthma 01/09/2019  . Thyroid nodule 12/12/2018    Current Outpatient Medications on File Prior to Visit  Medication Sig Dispense Refill  . albuterol (VENTOLIN HFA) 108 (90 Base) MCG/ACT inhaler Inhale 2 puffs into the lungs every 6 (six) hours as needed for wheezing or shortness of breath. 8 g 1  . ALPRAZolam (XANAX) 0.25 MG tablet Take 1-2 tablets (0.25-0.5 mg total) by mouth 3 (three) times daily as needed for anxiety. 90 tablet 0  . cyclobenzaprine (FLEXERIL) 5 MG tablet Take 1 tablet (5 mg total) by mouth 3 (three) times daily as needed for muscle spasms. 30 tablet 0  . escitalopram (LEXAPRO) 20 MG tablet Take 1 tablet (20 mg total) by mouth daily. 30 tablet 3  . guaiFENesin-Codeine 200-10 MG/5ML LIQD Take 5-10 mLs by mouth 3 (three) times daily as needed. 210 mL 0  . meloxicam (MOBIC) 15 MG tablet Take 1 tablet (15 mg total) by mouth daily. 30 tablet 2  . ondansetron (ZOFRAN) 4 MG tablet Take 1 tablet (4 mg total) by mouth every 8 (eight) hours as needed for nausea or vomiting. 20 tablet 0  . oxyCODONE-acetaminophen (PERCOCET) 10-325 MG  tablet Take 1 tablet by mouth every 4 (four) hours as needed for pain. 25 tablet 0  . traZODone (DESYREL) 50 MG tablet Take 2 tablets (100 mg total) by mouth at bedtime. 60 tablet 1   No current facility-administered medications on file prior to visit.    No Known Allergies  Objective: There were no vitals filed for this visit.  General: No acute distress, AAOx3  Left foot: Incision well-healed with no gapping or dehiscence at surgical site, mild swelling to 1st ray, there is a small fissure and dry cracking skin noted to the plantar first ray that is getting better, no erythema, no warmth, no drainage, there is small hypersensitivity to the dorsal incision as well, no acute signs of infection noted, Capillary fill time <3 seconds in all digits, gross sensation present via light touch to left foot except with numbness over incision and some sharp shooting pains. + pain with range of motion left 1st MTPJ and with direct palpation to tibial sesamoid area like before with range of motion limited patient has approximately 25 degrees of dorsiflexion and 5 degrees of plantarflexion.  No pain with calf compression.    Assessment and Plan:  Problem List Items Addressed This Visit      Musculoskeletal and Integument   Bunion - Primary (Chronic)    Other Visit Diagnoses    Left foot pain       S/P foot surgery, left            -  Patient seen and evaluated -Discussed with patient continue plan of care for postoperative course -Continue with Neosporin pain relief and may continue with warm cold compresses to desensitize the tender areas along the incision which is likely related to nerves and sensitivity -Advised patient to limit activity to tolerance and to continue with tylenol and motrin and icing in the evening like previous especially on days that she has done a lot at work or had physical therapy -Advised patient to use scar cream to surgical incision after shower like previous to the dorsal  incision like before -Continue with physical therapy exercises and advised patient next visit if she is still struggling with range of motion may dispense a Darco toe alignment splint to help with plantar flexing the great toe -Continue with work limited/light duty -Will plan for postoperative check and adding on splint if still painful and limited motion at next office visit in 2 weeks.   Asencion Islam, DPM

## 2020-08-27 ENCOUNTER — Other Ambulatory Visit: Payer: Self-pay

## 2020-08-27 ENCOUNTER — Encounter: Payer: Self-pay | Admitting: Physical Therapy

## 2020-08-27 ENCOUNTER — Ambulatory Visit: Payer: No Typology Code available for payment source | Admitting: Physical Therapy

## 2020-08-27 DIAGNOSIS — R2689 Other abnormalities of gait and mobility: Secondary | ICD-10-CM

## 2020-08-27 DIAGNOSIS — M6281 Muscle weakness (generalized): Secondary | ICD-10-CM

## 2020-08-27 DIAGNOSIS — M25572 Pain in left ankle and joints of left foot: Secondary | ICD-10-CM

## 2020-08-27 DIAGNOSIS — M25672 Stiffness of left ankle, not elsewhere classified: Secondary | ICD-10-CM

## 2020-08-27 NOTE — Therapy (Signed)
[facility] [address] [place], [place], [address] Phone: [phone]   Fax:  [phone]  Physical Therapy Treatment  Patient Details  Name: Melissa Blankenship MRN: [id number] Date of Birth: [birth year] Referring Provider (PT): Asencion Islam, [place]   Encounter Date: 08/27/2020   PT End of Session - 08/27/20 0722    Visit Number 3    Number of Visits 13    Date for PT Re-Evaluation 10/08/20    Authorization Type FOCUS with [employer] secondary  - follow [employer]    Authorization Time Period pending auth    PT Start Time 0716    PT Stop Time 0800    PT Time Calculation (min) 44 min           Past Medical History:  Diagnosis Date  . Anxiety   . Depression   . GERD (gastroesophageal reflux disease)   . Thyroid nodule     Past Surgical History:  Procedure Laterality Date  . ABDOMINAL HYSTERECTOMY    . BUNIONECTOMY Left 06/22/2020  . INCISION AND DRAINAGE / EXCISION THYROGLOSSAL CYST  2019   path report revealed hyperplastic thyroid tissue, non-malignant   . TUBAL LIGATION      There were no vitals filed for this visit.   Subjective Assessment - 08/27/20 0719    Subjective I am having stiffness and pain in my foot. Pain is 7/10 now. I have been working 12 hour shifts and this is my second week.    Currently in Pain? Yes    Pain Score 7     Pain Location Foot    Pain Orientation Left    Pain Descriptors / Indicators Aching   stiffness   Pain Type Surgical pain    Aggravating Factors  working, standing, walking    Pain Relieving Factors ice, ibuprofen                             [facility] Adult PT Treatment/Exercise - 08/27/20 0001      Ankle Exercises: Seated   Towel Inversion/Eversion 4 reps    Heel Raises Both;10 reps   x 2   Toe Raise 10 reps   x 2 bil   Other Seated Ankle Exercises passive toe extension and flexion stretcing - self stretching    Other Seated Ankle Exercises yellow  band toe extensions   small strip of yellow band     Ankle Exercises: Supine   T-Band yellow band 4 way      Ankle Exercises: Standing   Other Standing Ankle Exercises forward rocking with ankle DF on lead leg, and PF on trail leg 2 x 10    Other Standing Ankle Exercises heel strike/ toe off walking emphasis pressing through great toe 4 x 50 ft      Ankle Exercises: Stretches   Soleus Stretch 3 reps;30 seconds   standing   Gastroc Stretch 2 reps;30 seconds;3 reps   standing                   PT Short Term Goals - 08/13/20 1053      PT SHORT TERM GOAL #1   Title pt to be IND with inital HEP    Baseline no previous HEp    Time 3    Period Weeks    Status New    Target Date 09/03/20      PT SHORT TERM GOAL #2   Title  increase L ankle DF to >/= 0 degree to demo improvement in ankle Mobility    Baseline see flowsheet    Time 3    Period Weeks    Status New    Target Date 09/03/20      PT SHORT TERM GOAL #3   Title pt to report pain to </= 5/10 max pain for therapuetic progression    Baseline worst pain is 10/10    Time 3    Period Weeks    Status New    Target Date 09/03/20             PT Long Term Goals - 08/13/20 1055      PT LONG TERM GOAL #1   Title increase L ankle DF to >/= 8 degrees and PF to >/= 50 degrees to promote ROm required for efficient gait pattern    Baseline see flow sheet    Time 6    Period Weeks    Status New    Target Date 10/08/20      PT LONG TERM GOAL #2   Title increase gross ankle strength to >/= 4+/5 to promote ankle stability with walking/ standing    Baseline see flowsheet    Time 6    Period Weeks    Status New    Target Date 10/08/20      PT LONG TERM GOAL #3   Title pt to be able to stand / walk for >/= 45 min demonstrating efficient pattern  with </= 3/10 max pain for functional endurance rquired for work related activities.    Baseline able to walk 5-6 min, walk 15-20 min with max 10/10 pain    Time 6     Period Weeks    Status New    Target Date 10/08/20      PT LONG TERM GOAL #4   Title increase FOTO scoreo to >/= 61% to demo improvement in function    Baseline inital score 28%    Time 6    Period Weeks    Status New    Target Date 10/08/20      PT LONG TERM GOAL #5   Title pt to be IND with all HEP given and is able to maintain and progress current LOF    Baseline no previous HEP    Time 6    Period Weeks    Status New    Target Date 10/08/20                 Plan - 08/27/20 0808    Clinical Impression Statement Pt arrives reporting 7/10 pain in great toe. She saw MD yesterday who wants her to work on [employer] toe ROM.  Her DF AROM has improved. She is still limited with great toe extensions and flexion. Performed self stretching in seated for toe flexion and extension. Used small strip of yellow band to work on toe extension strength. Began standing calf stretching and worked on gait with heel strike and roll through. Began yellow band 4 way ankle. Patient tolerated all therex with c/o pain only with end range toe extension or palpation to anterior toe. Given Updated HEP.    PT Next Visit Plan review/ update HEP, apprehensive to pain, desensitization,  PNE, pre-gait training, ankle ROM and strengthening, modalities PRN    PT Home Exercise Plan [email or web address] - towel scrunch, towel inversion/ eversion, seated heel / toe raise,weight shifting laterally and forward, 4 way ankle yellow, standing calf  stretches, resisted toe extension with small strip of yellow band           Patient will benefit from skilled therapeutic intervention in order to improve the following deficits and impairments:  Improper body mechanics,Increased muscle spasms,Decreased strength,Postural dysfunction,Pain,Decreased activity tolerance,Decreased balance,Abnormal gait,Decreased range of motion,Decreased endurance  Visit Diagnosis: Pain in left ankle and joints of left foot  Muscle weakness  (generalized)  Stiffness of left ankle, not elsewhere classified  Other abnormalities of gait and mobility     Problem List Patient Active Problem List   Diagnosis Date Noted  . Viral upper respiratory tract infection 07/02/2020  . Health care maintenance 06/01/2020  . Bunion 05/14/2020  . GAD (generalized anxiety disorder) with panic attacks 01/09/2019  . Asthma 01/09/2019  . Thyroid nodule 12/12/2018    Melissa Blankenship, [place] 08/27/2020, 8:13 AM  [facility] [address] [place], [place], [address] Phone: [phone]   Fax:  [phone]  Name: Melissa Blankenship MRN: [id number] Date of Birth: December 18, 1980

## 2020-08-31 ENCOUNTER — Telehealth: Payer: Self-pay | Admitting: Behavioral Health

## 2020-08-31 ENCOUNTER — Ambulatory Visit: Payer: No Typology Code available for payment source | Admitting: Behavioral Health

## 2020-08-31 ENCOUNTER — Other Ambulatory Visit: Payer: Self-pay

## 2020-08-31 NOTE — Telephone Encounter (Signed)
Contacted Pt on cell phone & lft msg for today's appt. 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 Pt requested r/s for Thur if possible. Clinician acknowledged & agreed.  Dr. Monna Fam

## 2020-09-01 ENCOUNTER — Ambulatory Visit: Payer: No Typology Code available for payment source | Admitting: Physical Therapy

## 2020-09-03 ENCOUNTER — Ambulatory Visit: Payer: No Typology Code available for payment source | Admitting: Physical Therapy

## 2020-09-09 ENCOUNTER — Other Ambulatory Visit (HOSPITAL_COMMUNITY): Payer: Self-pay | Admitting: Sports Medicine

## 2020-09-09 ENCOUNTER — Encounter: Payer: Self-pay | Admitting: Sports Medicine

## 2020-09-09 ENCOUNTER — Other Ambulatory Visit: Payer: Self-pay

## 2020-09-09 ENCOUNTER — Ambulatory Visit (INDEPENDENT_AMBULATORY_CARE_PROVIDER_SITE_OTHER): Payer: No Typology Code available for payment source | Admitting: Sports Medicine

## 2020-09-09 DIAGNOSIS — Z9889 Other specified postprocedural states: Secondary | ICD-10-CM

## 2020-09-09 DIAGNOSIS — M79672 Pain in left foot: Secondary | ICD-10-CM

## 2020-09-09 DIAGNOSIS — M21619 Bunion of unspecified foot: Secondary | ICD-10-CM

## 2020-09-09 MED ORDER — PREDNISONE 10 MG (21) PO TBPK: ORAL_TABLET | ORAL | 0 refills | Status: DC

## 2020-09-09 MED FILL — predniSONE 10 MG TABS: 10 | 21 days supply | Qty: 21 | Fill #0

## 2020-09-09 NOTE — Progress Notes (Signed)
Subjective: Melissa Blankenship is a 40 y.o. female patient seen today in office for follow up post op care. Patient is a patient of Dr. Paulla Dolly who underwent left austin bunionectomy on 06/22/20.  Patient is in physical therapy and reports that she still has pain and stiffness. Pain is worse sub met 1 on left. Still unable to wear all shoes only able to wear a croc shoe.   Patient Active Problem List   Diagnosis Date Noted  . Viral upper respiratory tract infection 07/02/2020  . Health care maintenance 06/01/2020  . Bunion 05/14/2020  . GAD (generalized anxiety disorder) with panic attacks 01/09/2019  . Asthma 01/09/2019  . Thyroid nodule 12/12/2018    Current Outpatient Medications on File Prior to Visit  Medication Sig Dispense Refill  . albuterol (VENTOLIN HFA) 108 (90 Base) MCG/ACT inhaler Inhale 2 puffs into the lungs every 6 (six) hours as needed for wheezing or shortness of breath. 8 g 1  . ALPRAZolam (XANAX) 0.25 MG tablet Take 1-2 tablets (0.25-0.5 mg total) by mouth 3 (three) times daily as needed for anxiety. 90 tablet 0  . cyclobenzaprine (FLEXERIL) 5 MG tablet Take 1 tablet (5 mg total) by mouth 3 (three) times daily as needed for muscle spasms. 30 tablet 0  . escitalopram (LEXAPRO) 20 MG tablet Take 1 tablet (20 mg total) by mouth daily. 30 tablet 3  . guaiFENesin-Codeine 200-10 MG/5ML LIQD Take 5-10 mLs by mouth 3 (three) times daily as needed. 210 mL 0  . meloxicam (MOBIC) 15 MG tablet Take 1 tablet (15 mg total) by mouth daily. 30 tablet 2  . ondansetron (ZOFRAN) 4 MG tablet Take 1 tablet (4 mg total) by mouth every 8 (eight) hours as needed for nausea or vomiting. 20 tablet 0  . oxyCODONE-acetaminophen (PERCOCET) 10-325 MG tablet Take 1 tablet by mouth every 4 (four) hours as needed for pain. 25 tablet 0  . traZODone (DESYREL) 50 MG tablet Take 2 tablets (100 mg total) by mouth at bedtime. 60 tablet 1   No current facility-administered medications on file prior to visit.     No Known Allergies  Objective: There were no vitals filed for this visit.  General: No acute distress, AAOx3  Left foot: Incision well-healed with no gapping or dehiscence at surgical site, mild swelling to 1st ray, hypersensitivity 1st ray, Capillary fill time <3 seconds in all digits, gross sensation present via light touch to left foot except with numbness over incision and some sharp shooting pains. + pain with range of motion left 1st MTPJ and with direct palpation to tibial sesamoid area like before with range of motion limited patient has approximately 30 degrees of dorsiflexion and 5 degrees of plantarflexion.  No pain with calf compression.    Assessment and Plan:  Problem List Items Addressed This Visit      Musculoskeletal and Integument   Bunion - Primary (Chronic)    Other Visit Diagnoses    Left foot pain       S/P foot surgery, left            -Patient seen and evaluated -Discussed with patient continue plan of care for postoperative course -Rx prednisone dose pak -Continue with gentle massage over incision and scar creams -Advised patient to limit activity to tolerance and to continue with tylenol PRN -Continue with physical therapy exercises  -Continue with work limited/light duty -Will plan for postoperative check and adding on splint and MRI if still painful and limited motion at  next office visit in 2 weeks.   Landis Martins, DPM

## 2020-09-14 ENCOUNTER — Telehealth: Payer: Self-pay | Admitting: Behavioral Health

## 2020-09-14 ENCOUNTER — Ambulatory Visit: Payer: No Typology Code available for payment source | Admitting: Behavioral Health

## 2020-09-14 ENCOUNTER — Other Ambulatory Visit: Payer: Self-pay

## 2020-09-14 NOTE — Telephone Encounter (Signed)
Contacted Pt for session today @ 2:30pm. Pt was called into work last night & is trying to sleep before returning to work this evening @ 7:00pm.  Dr. Monna Fam

## 2020-09-16 ENCOUNTER — Ambulatory Visit: Payer: No Typology Code available for payment source | Admitting: Physical Therapy

## 2020-09-21 ENCOUNTER — Encounter: Payer: Self-pay | Admitting: Physical Therapy

## 2020-09-21 ENCOUNTER — Other Ambulatory Visit: Payer: Self-pay

## 2020-09-21 ENCOUNTER — Ambulatory Visit: Payer: No Typology Code available for payment source | Attending: Sports Medicine | Admitting: Physical Therapy

## 2020-09-21 DIAGNOSIS — M25672 Stiffness of left ankle, not elsewhere classified: Secondary | ICD-10-CM | POA: Insufficient documentation

## 2020-09-21 DIAGNOSIS — R2689 Other abnormalities of gait and mobility: Secondary | ICD-10-CM | POA: Insufficient documentation

## 2020-09-21 DIAGNOSIS — M6281 Muscle weakness (generalized): Secondary | ICD-10-CM | POA: Diagnosis present

## 2020-09-21 DIAGNOSIS — M25572 Pain in left ankle and joints of left foot: Secondary | ICD-10-CM | POA: Diagnosis present

## 2020-09-21 NOTE — Therapy (Addendum)
[place], [place], [address] Phone: [phone]   Fax:  [phone]  Physical Therapy Treatment /  discharge  Patient Details  Name: Melissa Blankenship MRN: [id number] Date of Birth: May 28, 1981 Referring Provider (PT): Landis Martins, [place]   Encounter Date: 09/21/2020   PT End of Session - 09/21/20 0935    Visit Number 4    Number of Visits 13    Date for PT Re-Evaluation 10/08/20    Authorization Type FOCUS with [employer] secondary  - follow [employer]    PT Start Time 0934    PT Stop Time 1012    PT Time Calculation (min) 38 min    Activity Tolerance Patient tolerated treatment well    Behavior During Therapy [facility] for tasks assessed/performed           Past Medical History:  Diagnosis Date  . Anxiety   . Depression   . GERD (gastroesophageal reflux disease)   . Thyroid nodule     Past Surgical History:  Procedure Laterality Date  . ABDOMINAL HYSTERECTOMY    . BUNIONECTOMY Left 06/22/2020  . INCISION AND DRAINAGE / EXCISION THYROGLOSSAL CYST  2019   path report revealed hyperplastic thyroid tissue, non-malignant   . TUBAL LIGATION      There were no vitals filed for this visit.   Subjective Assessment - 09/21/20 0936    Subjective " I think the biggest issue I have is putting shoe  and wearing throughout the day."    Currently in Pain? Yes    Pain Score 5     Pain Location Foot    Pain Orientation Left    Pain Descriptors / Indicators Aching    Pain Type Chronic pain    Pain Onset More than a month ago    Pain Frequency Intermittent    Aggravating Factors  working / standing.              [facility] PT Assessment - 09/21/20 0001      Assessment   Medical Diagnosis Bunion (M21.619), Plantar fasciitis (M72.2), Left foot pain (Z99.357), S/P foot surgery, left (S17.793)    Referring Provider (PT) Landis Martins, DPM                         [facility] Adult PT  Treatment/Exercise - 09/21/20 0001      Knee/Hip Exercises: Standing   Step Down 2 sets;Left;Step Height: 6"      Knee/Hip Exercises: Seated   Sit to Sand 2 sets;10 reps;without UE support      Ankle Exercises: Stretches   Slant Board Stretch 2 reps;30 seconds   gastroc/ soleus   Other Stretch great toe extensor stretching 3 x 30 secon combined with ankle PF to add to stretc      Ankle Exercises: Aerobic   Elliptical L5 x 5 min ramp L5      Ankle Exercises: Standing   Heel Raises 20 reps;Both   on airex with outshoes and cues to press through great toe   Toe Raise 20 reps   with HH  from tabel              Balance Exercises - 09/21/20 0001      Balance Exercises: Standing   Marching Foam/compliant surface;20 reps   alternating L/R              PT Short Term Goals - 08/13/20 1053  PT SHORT TERM GOAL #1   Title pt to be IND with inital HEP    Baseline no previous HEp    Time 3    Period Weeks    Status New    Target Date 09/03/20      PT SHORT TERM GOAL #2   Title increase L ankle DF to >/= 0 degree to demo improvement in ankle Mobility    Baseline see flowsheet    Time 3    Period Weeks    Status New    Target Date 09/03/20      PT SHORT TERM GOAL #3   Title pt to report pain to </= 5/10 max pain for therapuetic progression    Baseline worst pain is 10/10    Time 3    Period Weeks    Status New    Target Date 09/03/20             PT Long Term Goals - 08/13/20 1055      PT LONG TERM GOAL #1   Title increase L ankle DF to >/= 8 degrees and PF to >/= 50 degrees to promote ROm required for efficient gait pattern    Baseline see flow sheet    Time 6    Period Weeks    Status New    Target Date 10/08/20      PT LONG TERM GOAL #2   Title increase gross ankle strength to >/= 4+/5 to promote ankle stability with walking/ standing    Baseline see flowsheet    Time 6    Period Weeks    Status New    Target Date 10/08/20      PT LONG TERM  GOAL #3   Title pt to be able to stand / walk for >/= 45 min demonstrating efficient pattern  with </= 3/10 max pain for functional endurance rquired for work related activities.    Baseline able to walk 5-6 min, walk 15-20 min with max 10/10 pain    Time 6    Period Weeks    Status New    Target Date 10/08/20      PT LONG TERM GOAL #4   Title increase FOTO scoreo to >/= 61% to demo improvement in function    Baseline inital score 28%    Time 6    Period Weeks    Status New    Target Date 10/08/20      PT LONG TERM GOAL #5   Title pt to be IND with all HEP given and is able to maintain and progress current LOF    Baseline no previous HEP    Time 6    Period Weeks    Status New    Target Date 10/08/20                 Plan - 09/21/20 1005    Clinical Impression Statement pt arrives noting decreased pain in the great toe on the L. continued extensor stretching and gross ankle strengthening progressing resistance. progressed strengthenign to include knee and hip to promote proximal strengthening and control. end of session she reported no pain but felt tired. plan to reassess next session and determine if more PT is needed.    PT Treatment/Interventions ADLs/Self Care Home Management;Cryotherapy;Electrical Stimulation;Iontophoresis 63m/ml Dexamethasone;Moist Heat;Ultrasound;Stair training;Gait training;Functional mobility training;Therapeutic activities;Therapeutic exercise;Balance training;Neuromuscular re-education;Patient/family education;Manual techniques;Scar mobilization;Passive range of motion;Taping;Vasopneumatic Device;Dry needling    PT Next Visit Plan review HEP, FOTO, assess if  more PT is needed.  progress CKC strengthening, balanceankle ROM and strengthening, modalities PRN    PT Home Exercise Plan [id number] - towel scrunch, towel inversion/ eversion, seated heel / toe raise,weight shifting laterally and forward, 4 way ankle yellow, standing calf stretches, resisted toe  extension with small strip of yellow band    Consulted and Agree with Plan of Care Patient           Patient will benefit from skilled therapeutic intervention in order to improve the following deficits and impairments:  Improper body mechanics,Increased muscle spasms,Decreased strength,Postural dysfunction,Pain,Decreased activity tolerance,Decreased balance,Abnormal gait,Decreased range of motion,Decreased endurance  Visit Diagnosis: Pain in left ankle and joints of left foot  Muscle weakness (generalized)  Stiffness of left ankle, not elsewhere classified  Other abnormalities of gait and mobility     Problem List Patient Active Problem List   Diagnosis Date Noted  . Viral upper respiratory tract infection 07/02/2020  . Health care maintenance 06/01/2020  . Bunion 05/14/2020  . GAD (generalized anxiety disorder) with panic attacks 01/09/2019  . Asthma 01/09/2019  . Thyroid nodule 12/12/2018   Starr Lake PT, DPT, LAT, ATC  09/21/20  10:12 AM      [facility] [address] [place], [place], [address] Phone: [phone]   Fax:  [phone]  Name: Melissa Blankenship MRN: [id number] Date of Birth: 09/08/1980     PHYSICAL THERAPY DISCHARGE SUMMARY  Visits from Start of Care: 4  Current functional level related to goals / functional outcomes: See goals   Remaining deficits: Current status unknown   Education / Equipment: HEP  Plan: Patient agrees to discharge.  Patient goals were not met. Patient is being discharged due to not returning since the last visit.  ?????         Densil Ottey PT, DPT, LAT, ATC  11/11/20  10:13 AM

## 2020-09-23 ENCOUNTER — Encounter: Payer: No Typology Code available for payment source | Admitting: Sports Medicine

## 2020-10-05 ENCOUNTER — Ambulatory Visit: Payer: No Typology Code available for payment source | Admitting: Behavioral Health

## 2020-10-05 ENCOUNTER — Other Ambulatory Visit: Payer: Self-pay

## 2020-10-05 ENCOUNTER — Telehealth: Payer: Self-pay | Admitting: Behavioral Health

## 2020-10-05 NOTE — Telephone Encounter (Signed)
Spoke w/Pt while she waits in the [facility] Pkg Lot outside the ED while her Mother is being treated for possible PE or Lung Mass. Pt's Dtr called her while still at work this am & alerted her to GM's SOB. Pt transported Mother to the [facility] ED & is still waiting for a report from the ED.  Encouraged Pt in all ways & to leave as she feels able to change into clean clothes from work this morning, tend to Dtr & Dog & to take some time for self-care.  Pt agreed to this POA & appreciated call from Clinician.  Dr. Monna Fam

## 2020-10-06 ENCOUNTER — Ambulatory Visit: Payer: No Typology Code available for payment source | Admitting: Physical Therapy

## 2020-10-06 ENCOUNTER — Telehealth: Payer: Self-pay | Admitting: Physical Therapy

## 2020-10-06 NOTE — Telephone Encounter (Signed)
LVM regarding missed appointment today and that it was her last scheduled appointment. If she needs more PT or feels she no longer requires physical therapy to please call and let us know and we will either schedule more appointments or discharge.    Anay Walter PT, DPT, LAT, ATC  10/06/20  12:40 PM

## 2020-10-28 ENCOUNTER — Other Ambulatory Visit: Payer: Self-pay

## 2020-10-28 ENCOUNTER — Telehealth: Payer: Self-pay | Admitting: Behavioral Health

## 2020-10-28 ENCOUNTER — Ambulatory Visit: Payer: No Typology Code available for payment source | Admitting: Behavioral Health

## 2020-10-28 NOTE — Telephone Encounter (Signed)
Contacted Pt re: today's session @ 1:30pm. Pt did not answer call until 1:50pm. Asked Pt if she would like to r/s. Pt agreed.  Dr. Monna Fam

## 2020-11-04 ENCOUNTER — Ambulatory Visit (INDEPENDENT_AMBULATORY_CARE_PROVIDER_SITE_OTHER): Payer: No Typology Code available for payment source | Admitting: Sports Medicine

## 2020-11-04 ENCOUNTER — Other Ambulatory Visit: Payer: Self-pay

## 2020-11-04 DIAGNOSIS — M79672 Pain in left foot: Secondary | ICD-10-CM

## 2020-11-04 DIAGNOSIS — M21619 Bunion of unspecified foot: Secondary | ICD-10-CM

## 2020-11-04 DIAGNOSIS — M25872 Other specified joint disorders, left ankle and foot: Secondary | ICD-10-CM | POA: Diagnosis not present

## 2020-11-04 DIAGNOSIS — M779 Enthesopathy, unspecified: Secondary | ICD-10-CM | POA: Diagnosis not present

## 2020-11-04 DIAGNOSIS — Z9889 Other specified postprocedural states: Secondary | ICD-10-CM

## 2020-11-04 NOTE — Progress Notes (Signed)
Subjective: Melissa Blankenship is a 40 y.o. female patient seen today in office for follow up post op care. Patient is a patient of Dr. Charlsie Merles who underwent left austin bunionectomy on 06/22/20.  Patient is has completed physical therapy and reports continued improvement states that she is finally getting a little bit more motion but still experiences a little bit of pain underneath the big toe joint particularly when she has long work shifts.  Patient reports that she is able to wear more variety of shoes but still favors wearing her crocs.  Patient denies any other pedal complaints at this time.  Patient Active Problem List   Diagnosis Date Noted  . Viral upper respiratory tract infection 07/02/2020  . Health care maintenance 06/01/2020  . Bunion 05/14/2020  . GAD (generalized anxiety disorder) with panic attacks 01/09/2019  . Asthma 01/09/2019  . Thyroid nodule 12/12/2018    Current Outpatient Medications on File Prior to Visit  Medication Sig Dispense Refill  . albuterol (VENTOLIN HFA) 108 (90 Base) MCG/ACT inhaler Inhale 2 puffs into the lungs every 6 (six) hours as needed for wheezing or shortness of breath. 8 g 1  . ALPRAZolam (XANAX) 0.25 MG tablet Take 1-2 tablets (0.25-0.5 mg total) by mouth 3 (three) times daily as needed for anxiety. 90 tablet 0  . cyclobenzaprine (FLEXERIL) 5 MG tablet Take 1 tablet (5 mg total) by mouth 3 (three) times daily as needed for muscle spasms. 30 tablet 0  . escitalopram (LEXAPRO) 20 MG tablet TAKE 1 TABLET (20 MG TOTAL) BY MOUTH DAILY. 30 tablet 3  . guaiFENesin-codeine (ROBITUSSIN AC) 100-10 MG/5ML syrup TAKE 5-10 MLS BY MOUTH 3 (THREE) TIMES DAILY AS NEEDED. 210 mL 0  . guaiFENesin-Codeine 200-10 MG/5ML LIQD Take 5-10 mLs by mouth 3 (three) times daily as needed. 210 mL 0  . influenza vac split quadrivalent PF (FLUARIX) 0.5 ML injection TO BE ADMINISTERED BY PHARMACIST (Patient taking differently: TO BE ADMINISTERED BY PHARMACIST) .5 mL 0  . meloxicam  (MOBIC) 15 MG tablet TAKE 1 TABLET (15 MG TOTAL) BY MOUTH DAILY. 30 tablet 2  . ondansetron (ZOFRAN) 4 MG tablet TAKE 1 TABLET (4 MG TOTAL) BY MOUTH EVERY 8 (EIGHT) HOURS AS NEEDED FOR NAUSEA OR VOMITING. 20 tablet 0  . oxyCODONE-acetaminophen (PERCOCET) 10-325 MG tablet TAKE 1 TABLET BY MOUTH EVERY 4 (FOUR) HOURS AS NEEDED FOR PAIN. 25 tablet 0  . predniSONE (DELTASONE) 10 MG tablet TAKE ALL 6 TABLETS BY MOUTH ON DAY 1, THEN DECREASE BY ONE TABLET EACH DAY (6-5-4-3-2-1) 21 tablet 0  . predniSONE (STERAPRED UNI-PAK 21 TAB) 10 MG (21) TBPK tablet Take as directed 21 tablet 0  . traZODone (DESYREL) 50 MG tablet Take 2 tablets (100 mg total) by mouth at bedtime. 60 tablet 1   No current facility-administered medications on file prior to visit.    No Known Allergies  Objective: There were no vitals filed for this visit.  General: No acute distress, AAOx3  Left foot: Incision well-healed, minimal swelling to 1st ray, decreased hypersensitivity 1st ray, Capillary fill time <3 seconds in all digits, gross sensation present via light touch to left foot except with decreased numbness over incision and decreased sharp shooting pains.  Minimal pain with range of motion left 1st MTPJ and with direct palpation to tibial sesamoid area like before that is slowly improving, her range of motion has improved approximately 45 degrees of dorsiflexion and 5 degrees of plantarflexion.  No pain with calf compression.  Assessment and Plan:  Problem List Items Addressed This Visit      Musculoskeletal and Integument   Bunion - Primary (Chronic)    Other Visit Diagnoses    Capsulitis       Sesamoiditis of left foot       Left foot pain       S/P foot surgery, left            -Patient seen and evaluated -Re-Discussed with patient continue plan of care for postoperative course -Advised patient that since her symptoms are improving she should continue with exercises as taught by physical therapy at  home -Continue with gentle massage over incision and scar creams like previous -Advised patient to limit activity to tolerance and to continue with tylenol PRN like previous -Continue with work to tolerance with good supportive shoes -We will hold off on adding toe splint or MRI at this time since patient symptoms are improving -Will plan for postoperative check and x-rays at next visit  Asencion Islam, DPM

## 2020-11-05 ENCOUNTER — Encounter: Payer: Self-pay | Admitting: Sports Medicine

## 2020-12-02 ENCOUNTER — Encounter: Payer: Self-pay | Admitting: Sports Medicine

## 2020-12-02 ENCOUNTER — Ambulatory Visit (INDEPENDENT_AMBULATORY_CARE_PROVIDER_SITE_OTHER): Payer: No Typology Code available for payment source | Admitting: Sports Medicine

## 2020-12-02 ENCOUNTER — Ambulatory Visit (INDEPENDENT_AMBULATORY_CARE_PROVIDER_SITE_OTHER): Payer: No Typology Code available for payment source

## 2020-12-02 ENCOUNTER — Ambulatory Visit: Payer: No Typology Code available for payment source | Admitting: Sports Medicine

## 2020-12-02 ENCOUNTER — Other Ambulatory Visit: Payer: Self-pay

## 2020-12-02 DIAGNOSIS — M779 Enthesopathy, unspecified: Secondary | ICD-10-CM

## 2020-12-02 DIAGNOSIS — Z9889 Other specified postprocedural states: Secondary | ICD-10-CM

## 2020-12-02 DIAGNOSIS — M25872 Other specified joint disorders, left ankle and foot: Secondary | ICD-10-CM | POA: Diagnosis not present

## 2020-12-02 DIAGNOSIS — M21619 Bunion of unspecified foot: Secondary | ICD-10-CM

## 2020-12-02 DIAGNOSIS — M79672 Pain in left foot: Secondary | ICD-10-CM

## 2020-12-02 NOTE — Progress Notes (Signed)
Subjective: Melissa Blankenship is a 40 y.o. female patient seen today in office for follow up post op care. Patient is a patient of Dr. Charlsie Merles who underwent left austin bunionectomy on 06/22/20.  Patient has completed physical therapy and reports that her pain is starting to worsen again, experiencing pain to ball underneath 2nd toe and pain under the 1st toe joint states that pain has made it uncomfortable at work, she has been using boot and at home trying a tennis shoe but can only wear it for a few hours at a time. Admits so rubbing in her boot and having to reapply a thicker sock and extra padding because of motion.  Patient denies any other pedal complaints at this time.  Patient Active Problem List   Diagnosis Date Noted   Viral upper respiratory tract infection 07/02/2020   Health care maintenance 06/01/2020   Bunion 05/14/2020   GAD (generalized anxiety disorder) with panic attacks 01/09/2019   Asthma 01/09/2019   Thyroid nodule 12/12/2018    Current Outpatient Medications on File Prior to Visit  Medication Sig Dispense Refill   albuterol (VENTOLIN HFA) 108 (90 Base) MCG/ACT inhaler Inhale 2 puffs into the lungs every 6 (six) hours as needed for wheezing or shortness of breath. 8 g 1   ALPRAZolam (XANAX) 0.25 MG tablet Take 1-2 tablets (0.25-0.5 mg total) by mouth 3 (three) times daily as needed for anxiety. 90 tablet 0   cyclobenzaprine (FLEXERIL) 5 MG tablet Take 1 tablet (5 mg total) by mouth 3 (three) times daily as needed for muscle spasms. 30 tablet 0   escitalopram (LEXAPRO) 20 MG tablet TAKE 1 TABLET (20 MG TOTAL) BY MOUTH DAILY. 30 tablet 3   guaiFENesin-codeine (ROBITUSSIN AC) 100-10 MG/5ML syrup TAKE 5-10 MLS BY MOUTH 3 (THREE) TIMES DAILY AS NEEDED. 210 mL 0   guaiFENesin-Codeine 200-10 MG/5ML LIQD Take 5-10 mLs by mouth 3 (three) times daily as needed. 210 mL 0   influenza vac split quadrivalent PF (FLUARIX) 0.5 ML injection TO BE ADMINISTERED BY PHARMACIST (Patient taking  differently: TO BE ADMINISTERED BY PHARMACIST) .5 mL 0   meloxicam (MOBIC) 15 MG tablet TAKE 1 TABLET (15 MG TOTAL) BY MOUTH DAILY. 30 tablet 2   ondansetron (ZOFRAN) 4 MG tablet TAKE 1 TABLET (4 MG TOTAL) BY MOUTH EVERY 8 (EIGHT) HOURS AS NEEDED FOR NAUSEA OR VOMITING. 20 tablet 0   oxyCODONE-acetaminophen (PERCOCET) 10-325 MG tablet TAKE 1 TABLET BY MOUTH EVERY 4 (FOUR) HOURS AS NEEDED FOR PAIN. 25 tablet 0   predniSONE (DELTASONE) 10 MG tablet TAKE ALL 6 TABLETS BY MOUTH ON DAY 1, THEN DECREASE BY ONE TABLET EACH DAY (6-5-4-3-2-1) 21 tablet 0   predniSONE (STERAPRED UNI-PAK 21 TAB) 10 MG (21) TBPK tablet Take as directed 21 tablet 0   traZODone (DESYREL) 50 MG tablet Take 2 tablets (100 mg total) by mouth at bedtime. 60 tablet 1   No current facility-administered medications on file prior to visit.    No Known Allergies  Objective: There were no vitals filed for this visit.  General: No acute distress, AAOx3  Left foot: Incision well-healed, minimal swelling to 1st ray, decreased hypersensitivity 1st ray, however there is some sensitivity and pain sesamoid complex and plantar second toe along the flexor tendon course, capillary fill time <3 seconds in all digits, gross sensation present via light touch to left foot except with decreased numbness over incision and decreased sharp shooting pains over the surgical site.  Minimal pain with range of  motion left 1st MTPJ and with direct palpation to sesamoid area that has worsened again, range of motion slightly guarded this morning patient has just got off of work with dorsiflexion of 40 degrees and plantarflexion of 5 degrees.  No pain with calf compression.    Assessment and Plan:  Problem List Items Addressed This Visit       Musculoskeletal and Integument   Bunion (Chronic)   Other Visit Diagnoses     S/P foot surgery, left    -  Primary   Relevant Orders   DG Foot Complete Left   Capsulitis       Sesamoiditis of left foot        Left foot pain            -Patient seen and evaluated -Re-Discussed with patient continue plan of care for postoperative course -Advised patient that since her symptoms are retrogressed think that it is best to proceed with getting an MRI for further evaluation to rule out any additional sesamoid stress bursitis or any underlying capsulitis or pain to her first and now second toes -Dispensed a new cam boot for patient to use when at work -Patient may still continue to with tolerance of using a normal shoe when off from work however if pain worsens should continue with cam boot -Continue with gentle massage over incision and scar creams like previous -Advised patient to limit activity to tolerance and to continue with tylenol PRN like previous -Work note provided for light duty and use of cam boot until next office visit -Will plan for MRI results at next visit  Asencion Islam, DPM

## 2020-12-09 ENCOUNTER — Telehealth: Payer: Self-pay

## 2020-12-09 DIAGNOSIS — F411 Generalized anxiety disorder: Secondary | ICD-10-CM

## 2020-12-09 NOTE — Telephone Encounter (Signed)
Requesting to speak with Dr. Ephriam Knuckles, please call pt back.

## 2020-12-09 NOTE — Telephone Encounter (Signed)
TC to patient, informed patient that Dr. Ephriam Knuckles was in clinic seeing patients and asked in RN could assist her with anything.  Pt states she "will be sending FMLA forms her way and is on the phone with [employer] right now".  RN informed patient to have forms sent to [facility].  Patient also "wants to update her med list and wants to talk with Dr. Rica Records about this".  RN informed patient that RN can update her med list and asked which medications she would like to update, she states "Xanax and Tramadol, and states she wants to speak to Dr. Rica Records and update her on some things and asking for MD to call her".  Will forward to PCP. SChaplin, RN,BSN

## 2020-12-09 NOTE — Assessment & Plan Note (Signed)
Melissa Blankenship had called into the office earlier today, asking to chat with me. I returned her call this evening. She says that she had been doing really well on the lexapro and had weaned herself off of it due to feeling she didn't need it. Unfortunately, she has been experiencing a number of stressors in her life, including complications with her bunionectomy that she had done a while back, having to suddenly find housing due to her rental place being sold, and related to her mom who is severely ill due to cancer.  She has started having difficulty going to work lately related to anxiety and panic attacks. Denies SI She would like to get back started on the lexapro and is kindly asking [place] to complete FMLA paperwork.  I think that this deserves an office visit since she hasn't been seen here in the office since 05/2020. She is agreeable to coming in to see one of my colleagues in the [facility].   I would recommend restarting the lexapro and have her titrate up to the 20mg , as this has worked well for her in the past. Panic attacks were well managed on xanax in the past. She has not had any red flags and was responsible with their use. She had previously visited with Dr. and felt she benefited from their interactions so I would try to get her restarted with therapy.   I am happy to complete the FMLA paperwork once our office recieves it. Appreciate if the provider who sees her in clinic can let me know so I can complete it.

## 2020-12-09 NOTE — Telephone Encounter (Signed)
I will try to give her a call after clinic. Thank you for letting me know!

## 2020-12-14 ENCOUNTER — Encounter: Payer: Self-pay | Admitting: *Deleted

## 2020-12-24 ENCOUNTER — Encounter: Payer: Self-pay | Admitting: Internal Medicine

## 2020-12-24 ENCOUNTER — Other Ambulatory Visit (HOSPITAL_COMMUNITY): Payer: Self-pay

## 2020-12-24 ENCOUNTER — Ambulatory Visit (INDEPENDENT_AMBULATORY_CARE_PROVIDER_SITE_OTHER): Payer: No Typology Code available for payment source | Admitting: Internal Medicine

## 2020-12-24 VITALS — BP 104/85 | HR 78 | Temp 98.2°F | Ht 63.0 in | Wt 124.6 lb

## 2020-12-24 DIAGNOSIS — F411 Generalized anxiety disorder: Secondary | ICD-10-CM

## 2020-12-24 DIAGNOSIS — F410 Panic disorder [episodic paroxysmal anxiety] without agoraphobia: Secondary | ICD-10-CM | POA: Diagnosis not present

## 2020-12-24 MED ORDER — ALPRAZOLAM 0.25 MG PO TABS
0.2500 mg | ORAL_TABLET | Freq: Three times a day (TID) | ORAL | 0 refills | Status: DC | PRN
  Filled 2020-12-24: qty 90, 15d supply, fill #0

## 2020-12-24 MED ORDER — ESCITALOPRAM OXALATE 20 MG PO TABS
20.0000 mg | ORAL_TABLET | Freq: Every day | ORAL | 3 refills | Status: DC
  Filled 2020-12-24: qty 30, 30d supply, fill #0

## 2020-12-24 NOTE — Progress Notes (Signed)
   CC: Anxiety disorder  HPI:  [name] is a 40 y.o. with a PMHx as listed below who presents to the clinic for anxiety disorder.   Please see the Encounters tab for problem-based Assessment & Plan regarding status of patient's acute and chronic conditions.  Past Medical History:  Diagnosis Date   Anxiety    Depression    GERD (gastroesophageal reflux disease)    Thyroid nodule    Viral upper respiratory tract infection 07/02/2020   Review of Systems: Review of Systems  Constitutional:  Negative for chills and fever.  Respiratory:  Positive for shortness of breath (with panic attacks).   Cardiovascular:  Positive for palpitations (with panic attacks).  Gastrointestinal:  Negative for abdominal pain, diarrhea, nausea and vomiting.  Neurological:  Positive for dizziness (with panic attacks). Negative for focal weakness and weakness.  Psychiatric/Behavioral:  Positive for depression. Negative for hallucinations, substance abuse and suicidal ideas. The patient is nervous/anxious and has insomnia.    Physical Exam:  Vitals:   12/24/20 0925  BP: 104/85  Pulse: 78  Temp: 98.2 F (36.8 C)  TempSrc: Oral  SpO2: 100%  Weight: 124 lb 9.6 oz (56.5 kg)  Height: 5\' 3"  (1.6 m)   Physical Exam Vitals and nursing note reviewed.  Constitutional:      General: She is not in acute distress.    Appearance: She is normal weight.  Neurological:     Mental Status: She is alert.  Psychiatric:        Attention and Perception: Attention normal.        Mood and Affect: Mood is anxious. Affect is tearful.        Speech: Speech normal.        Behavior: Behavior normal. Behavior is cooperative.        Thought Content: Thought content normal.        Cognition and Memory: Cognition and memory normal.        Judgment: Judgment normal.    Assessment & Plan:   See Encounters Tab for problem based charting.  Patient discussed with Dr. 

## 2020-12-24 NOTE — Patient Instructions (Addendum)
It was nice seeing you today! Thank you for choosing [facility] for your Primary Care.    Today we talked about:   Anxiety with Panic Attacks:  We discussed restarting Lexapro. This will be your daily medication to help with your anxiety.  For the 7 days, take 1/2 a tablet daily. Then increase to 1 tablet once per day. You can take it at anytime in the day. For the panic attacks, we will restart Xanax. You can take this up to 3 times per day as needed. Try to take it as soon as you feel the panic attack coming on.   Follow up in 4-6 weeks to see how you are doing. Dr. Ephriam Knuckles will be back in the office in September.

## 2020-12-24 NOTE — Assessment & Plan Note (Addendum)
Melissa Blankenship states that she has had a long-term history of anxiety with panic attacks.  Symptoms became severe when her mother developed cancer and Melissa Blankenship became her primary caretaker.  In 2021, she was being treated with Lexapro and as needed Xanax.  She began to feel much better approximately 2 months after starting Lexapro and abruptly stopped it as she felt like she did not require it anymore.  Over the last several weeks, Melissa Blankenship states that her anxiety has significantly worsened and she has begun to develop more and more panic attacks.  She describes the panic attacks as sudden onset with palpitations, sweating, headache, and difficulty breathing.  She often has to run outside to take deep breaths in order to alleviate the symptoms.  Her feelings of anxiety have become intrusive and has led to significant decline in her quality of life.  She is unable to sleep, isolating herself from her friends.  She states her only break is when she is at work and then when she leaves work, she feels extremely overwhelmed.  Her only social support system at this time is her mother and children.  She finds it difficult to talk to her friends about what is going on.  She finds it embarrassing that she has anxiety and does not talk about it openly.  She notes in the past she is also had a good response with sessions with Dr. Monna Fam.   Melissa Blankenship denies any thoughts of suicidal ideation or homicidal ideation.  She denies any hallucinations.  Assessment/Plan:  GAD-7: 17  PHQ-9: 14  Severe anxiety disorder with panic attacks.  Relapse likely secondary to abrupt cessation of medication and stressors within her life.  We discussed restarting both medication treatment as well as therapy treatment.  She is in agreement with this.  We will start with a taper up to the previous successful dose of Lexapro.  - Restart Lexapro: 10 mg for 7 days, then increase to 20 mg  - Restart Xanax 1-2 tablets TID PRN for panic  attacks  - Schedule appointment with Dr. Monna Fam - ADA paperwork filled out

## 2021-01-04 NOTE — Progress Notes (Signed)
Internal Medicine Clinic Attending  Case discussed with Dr. Basaraba  At the time of the visit.  We reviewed the resident's history and exam and pertinent patient test results.  I agree with the assessment, diagnosis, and plan of care documented in the resident's note.  

## 2021-02-04 ENCOUNTER — Other Ambulatory Visit: Payer: Self-pay | Admitting: Internal Medicine

## 2021-02-04 DIAGNOSIS — Z1231 Encounter for screening mammogram for malignant neoplasm of breast: Secondary | ICD-10-CM

## 2021-02-10 ENCOUNTER — Ambulatory Visit (INDEPENDENT_AMBULATORY_CARE_PROVIDER_SITE_OTHER): Payer: No Typology Code available for payment source | Admitting: Internal Medicine

## 2021-02-10 ENCOUNTER — Encounter: Payer: Self-pay | Admitting: Internal Medicine

## 2021-02-10 ENCOUNTER — Other Ambulatory Visit: Payer: Self-pay

## 2021-02-10 VITALS — BP 103/72 | HR 78 | Temp 98.7°F | Ht 63.0 in | Wt 127.7 lb

## 2021-02-10 DIAGNOSIS — Z716 Tobacco abuse counseling: Secondary | ICD-10-CM | POA: Diagnosis not present

## 2021-02-10 DIAGNOSIS — F411 Generalized anxiety disorder: Secondary | ICD-10-CM

## 2021-02-10 DIAGNOSIS — F17209 Nicotine dependence, unspecified, with unspecified nicotine-induced disorders: Secondary | ICD-10-CM | POA: Diagnosis not present

## 2021-02-10 DIAGNOSIS — Z0000 Encounter for general adult medical examination without abnormal findings: Secondary | ICD-10-CM | POA: Diagnosis not present

## 2021-02-10 DIAGNOSIS — F17200 Nicotine dependence, unspecified, uncomplicated: Secondary | ICD-10-CM | POA: Insufficient documentation

## 2021-02-10 NOTE — Assessment & Plan Note (Signed)
Still smoking. A pack of cigarettes takes her about 2 weeks to go through. She recognizes the long term implications of tobacco use. Will continue to work on cessation.

## 2021-02-10 NOTE — Assessment & Plan Note (Signed)
History of complete hysterectomy. Pap smear not indicated.

## 2021-02-10 NOTE — Progress Notes (Signed)
   Office Visit   Patient ID: Melissa Blankenship, female    DOB: [birth year], 40 y.o.   MRN: [id number]   PCP: Elige Radon, MD   Subjective:  Melissa Blankenship is a 40 y.o. year old female who presents for follow up of anxiety and tobacco use disorder. Please refer to problem based charting for assessment and plan.   ACTIVE MEDICATIONS   Outpatient Medications Prior to Visit  Medication Sig   albuterol (VENTOLIN HFA) 108 (90 Base) MCG/ACT inhaler Inhale 2 puffs into the lungs every 6 (six) hours as needed for wheezing or shortness of breath.   ALPRAZolam (XANAX) 0.25 MG tablet Take 1-2 tablets (0.25-0.5 mg total) by mouth 3 (three) times daily as needed (Panic attacks).   escitalopram (LEXAPRO) 20 MG tablet Take 1 tablet (20 mg total) by mouth daily.   meloxicam (MOBIC) 15 MG tablet TAKE 1 TABLET (15 MG TOTAL) BY MOUTH DAILY.   No facility-administered medications prior to visit.     Objective:  Blood pressure 103/72, pulse 78, temperature 98.7 F (37.1 C), temperature source Oral, height 5\' 3"  (1.6 m), weight 127 lb 11.2 oz (57.9 kg), last menstrual period 04/29/2011, SpO2 100 %.  BP 103/72 (BP Location: Left Arm, Patient Position: Sitting, Cuff Size: Small)   Pulse 78   Temp 98.7 F (37.1 C) (Oral)   Ht 5\' 3"  (1.6 m)   Wt 127 lb 11.2 oz (57.9 kg) Comment: WEIGHT WITH BOOT  LMP 04/29/2011   SpO2 100%   BMI 22.62 kg/m  Wt Readings from Last 3 Encounters:  02/10/21 127 lb 11.2 oz (57.9 kg)  12/24/20 124 lb 9.6 oz (56.5 kg)  05/31/20 128 lb 1.6 oz (58.1 kg)    BP Readings from Last 3 Encounters:  02/10/21 103/72  12/24/20 104/85  05/31/20 115/62   General: well appearing, in no acute distress Cardiac: RRR Pulm: lungs clear throughout Psych: normal affect   Health Maintenance:   Health Maintenance  Topic Date Due   Pneumococcal Vaccine 55-27 Years old (1 - PCV) Never done   COVID-19 Vaccine (3 - Booster for Pfizer series) 04/12/2020   INFLUENZA VACCINE  01/10/2021    TETANUS/TDAP  05/31/2030   Hepatitis C Screening  Completed   HIV Screening  Completed   HPV VACCINES  Aged Out   PAP SMEAR-Modifier  Discontinued    Assessment & Plan:   Problem List Items Addressed This Visit       Other   GAD (generalized anxiety disorder) with panic attacks (Chronic)    Symptomatically improved since resuming lexapro. Uses xanax very sparingly.  PHQ 9 is 5. Continue current management.      Health care maintenance - Primary (Chronic)   Relevant Orders   Ambulatory referral to Ophthalmology   Tobacco use disorder    Still smoking. A pack of cigarettes takes her about 2 weeks to go through. She recognizes the long term implications of tobacco use. Will continue to work on cessation.      Other Visit Diagnoses     Tobacco use disorder, continuous       Encounter for smoking cessation counseling          Return in about 4 months (around 06/13/2021).   Pt discussed with Dr. 06/02/2030, MD Internal Medicine Resident PGY-3 08/11/2021 Internal Medicine Residency Pager: [phone] 02/10/2021 12:43 PM

## 2021-02-10 NOTE — Assessment & Plan Note (Signed)
Symptomatically improved since resuming lexapro. Uses xanax very sparingly.  PHQ 9 is 5. Continue current management.

## 2021-02-11 NOTE — Progress Notes (Signed)
Internal Medicine Clinic Attending  Case discussed with Dr. Christian  At the time of the visit.  We reviewed the resident's history and exam and pertinent patient test results.  I agree with the assessment, diagnosis, and plan of care documented in the resident's note.  

## 2021-03-01 ENCOUNTER — Telehealth: Payer: Self-pay | Admitting: Sports Medicine

## 2021-03-01 NOTE — Telephone Encounter (Signed)
Patient called the office wanting to know if she could receive another doctors not stating that she is still on light duty until further evaluation. She also states she hasn't received  a call about her MRI appointment .

## 2021-03-02 ENCOUNTER — Telehealth: Payer: Self-pay | Admitting: *Deleted

## 2021-03-02 ENCOUNTER — Telehealth: Payer: No Typology Code available for payment source | Admitting: Sports Medicine

## 2021-03-02 NOTE — Telephone Encounter (Signed)
Patient called the office wanting to know if she could receive another doctors not stating that she is still on light duty until further evaluation. She also states she hasn't received  a call about her MRI appointment . She states she would need this paperwork before 03/11/21

## 2021-03-02 NOTE — Telephone Encounter (Signed)
Called [facility] scheduling, stated that nothing was sent or approved for MRI. Will get authorized and resend.   Patient has been scheduled/confirmed 03/10/21@5 :00,arrival time is 4:30,scheduling number was given if she is needs to reschedule.   Authorization # [id number](03/02/21-04/01/21), spoke with Kevin,Aryris P. call ref.#:[id number]

## 2021-03-02 NOTE — Telephone Encounter (Signed)
Completed and patient scheduled

## 2021-03-04 ENCOUNTER — Encounter: Payer: Self-pay | Admitting: Sports Medicine

## 2021-03-07 ENCOUNTER — Encounter: Payer: Self-pay | Admitting: Sports Medicine

## 2021-03-10 ENCOUNTER — Ambulatory Visit (HOSPITAL_COMMUNITY)
Admission: RE | Admit: 2021-03-10 | Discharge: 2021-03-10 | Disposition: A | Discharge status: Home / Self Care | Payer: No Typology Code available for payment source | Source: Ambulatory Visit | Attending: Sports Medicine | Admitting: Sports Medicine

## 2021-03-10 ENCOUNTER — Other Ambulatory Visit: Payer: Self-pay

## 2021-03-10 DIAGNOSIS — M25872 Other specified joint disorders, left ankle and foot: Secondary | ICD-10-CM | POA: Diagnosis not present

## 2021-03-10 DIAGNOSIS — M79672 Pain in left foot: Secondary | ICD-10-CM | POA: Diagnosis present

## 2021-03-10 IMAGING — MR MR TOES*L* WO/W CM
6 of 11 series · 19 of 40 positions shown · IV contrast (Yes)
Comparison: [DATE] foot x-ray

CLINICAL DATA: Prior history of bunionectomy. Left chronic foot
pain.

EXAM:
MRI OF THE LEFT TOES WITHOUT AND WITH CONTRAST
TECHNIQUE: Multiplanar, multisequence MR imaging of the left forefoot was
performed both before and after administration of intravenous
contrast.
CONTRAST:  5mL GADAVIST GADOBUTROL 1 MMOL/ML IV SOLN

[Series 3: T1 · coronal · 3.0mm · 0.29mm/px · 4 of 44 slices shown (1 of 2)]
[im 1/44]
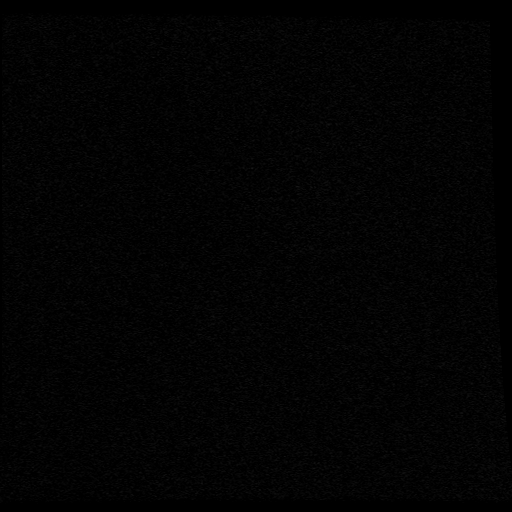
[im 15/44]
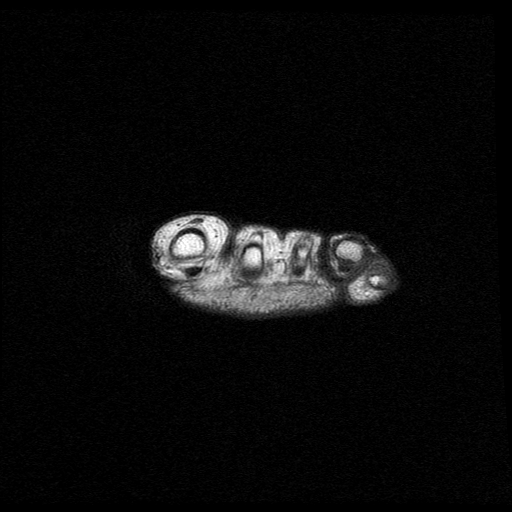
[im 29/44]
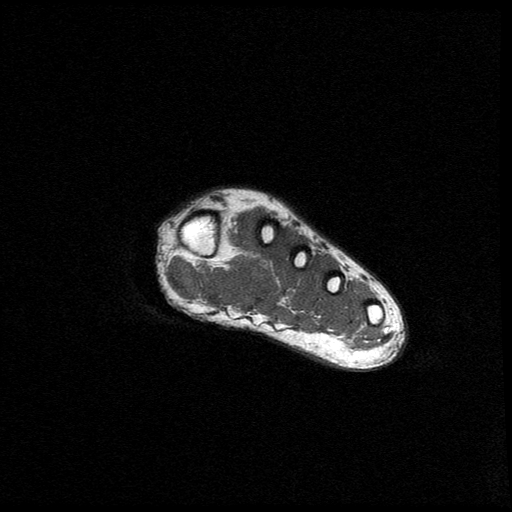
[im 44/44]
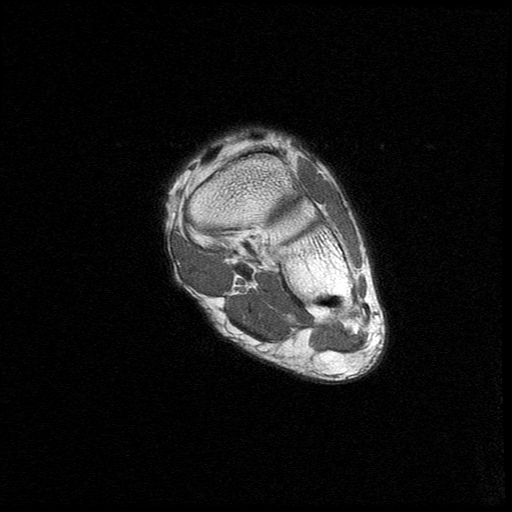

[Series 4: T2 fat-sat · coronal · 3.0mm · 0.29mm/px · 4 of 44 slices shown (1 of 2)]
[im 1/44]
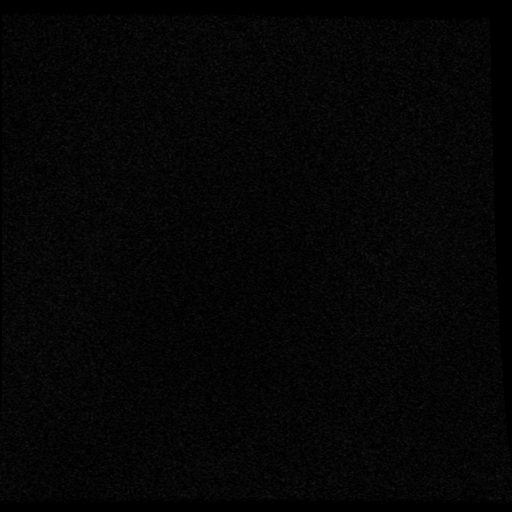
[im 15/44]
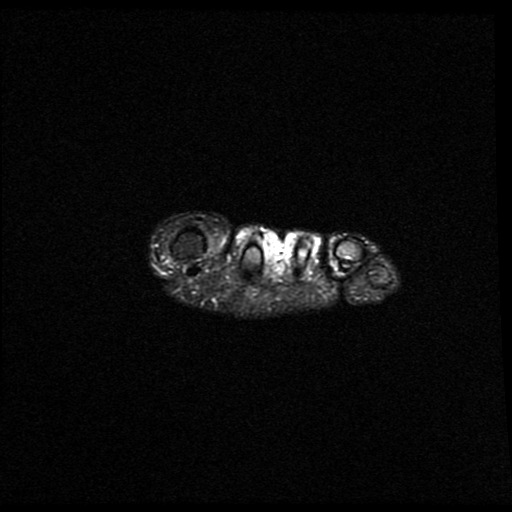
[im 29/44]
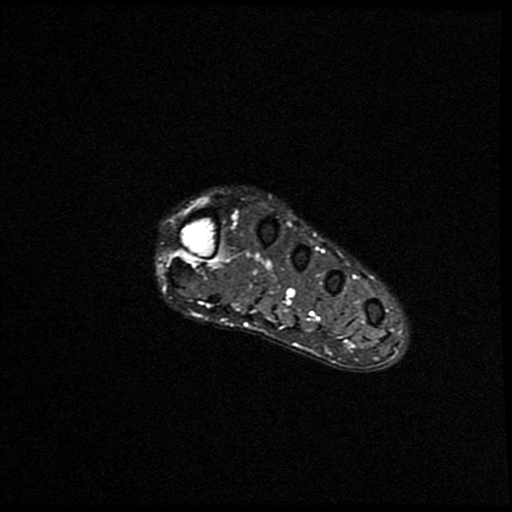
[im 44/44]
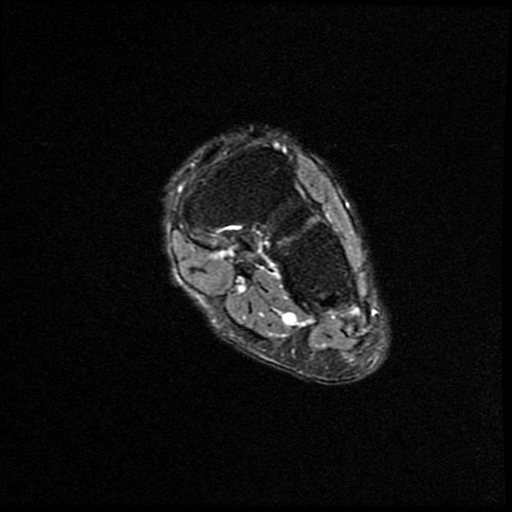

[Series 5: T2 fat-sat · oblique · 3.0mm · 0.31mm/px · 2 of 24 slices shown (2 of 2)]
[im 1/24]
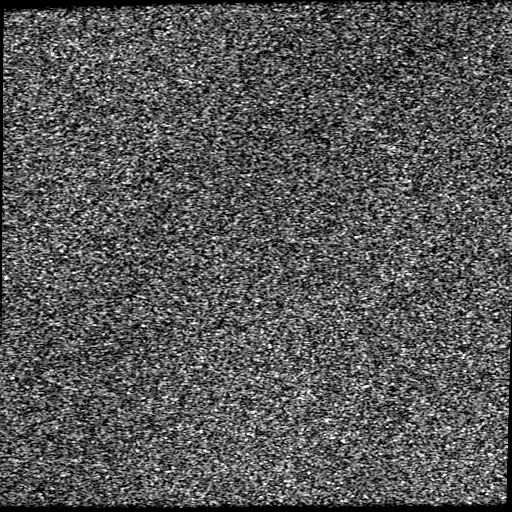
[im 24/24]
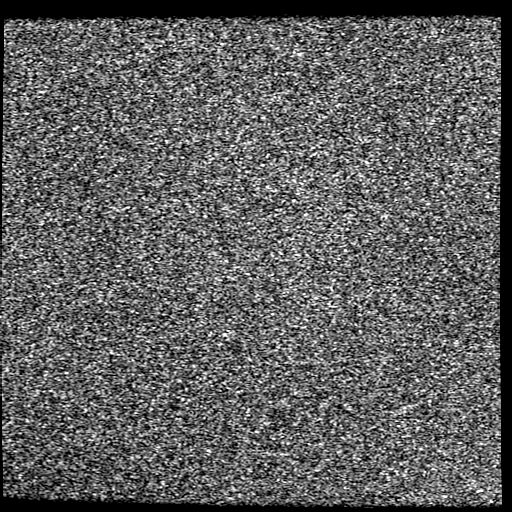

[Series 6: T1 · oblique · 3.0mm · 0.31mm/px · 3 of 24 slices shown (2 of 2)]
[im 1/24]
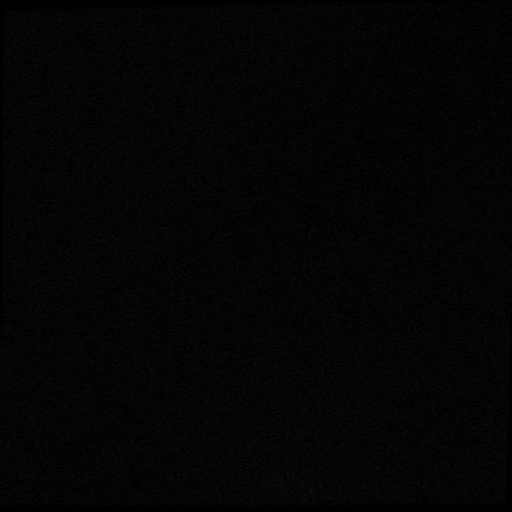
[im 12/24]
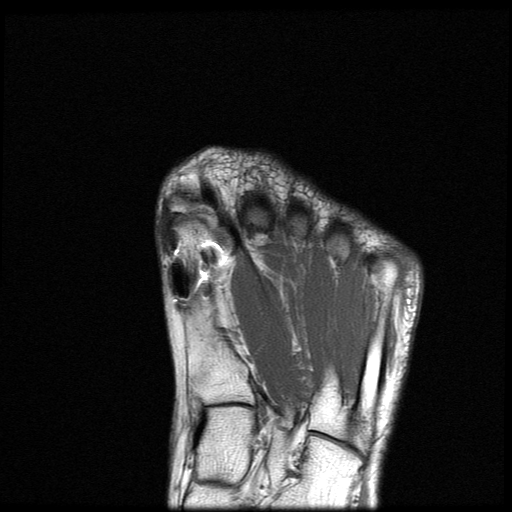
[im 24/24]
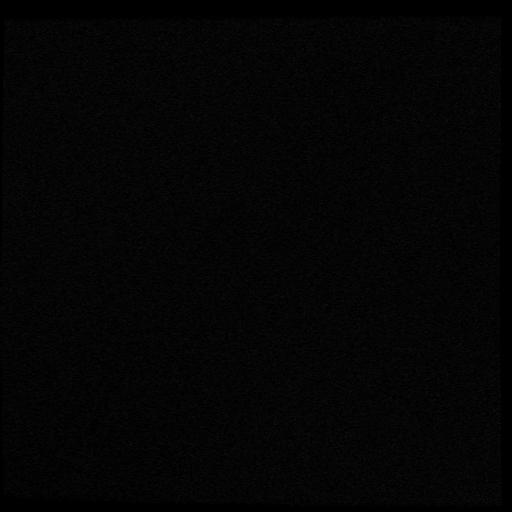

[Series 7: T1 fat-sat · coronal · non-contrast · 3.0mm · 0.29mm/px · 5 of 44 slices shown (1 of 2)]
[im 1/44]
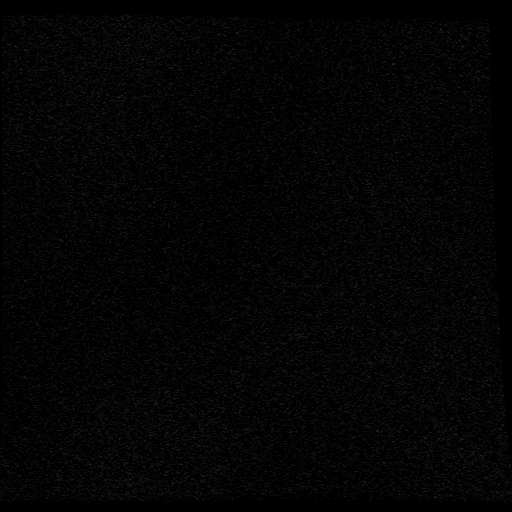
[im 11/44]
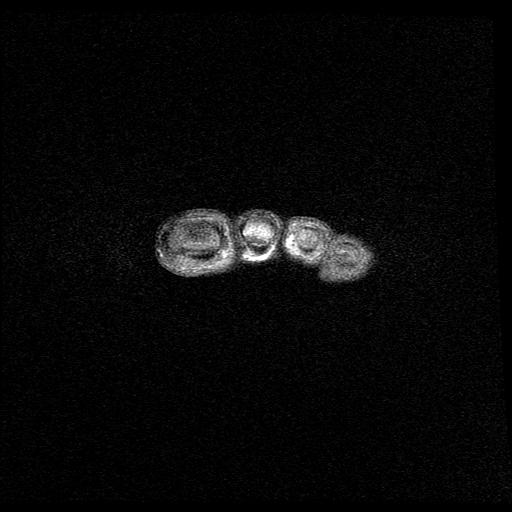
[im 22/44]
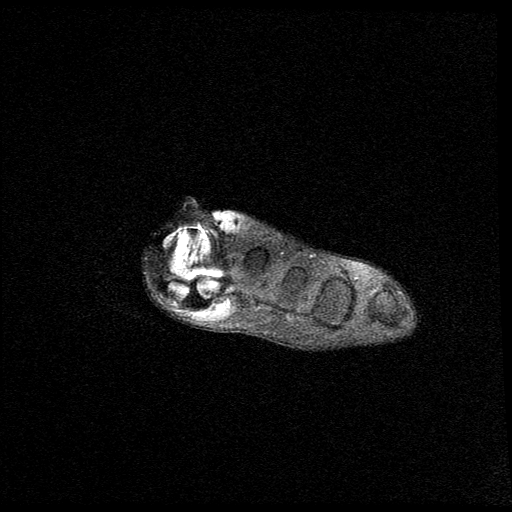
[im 33/44]
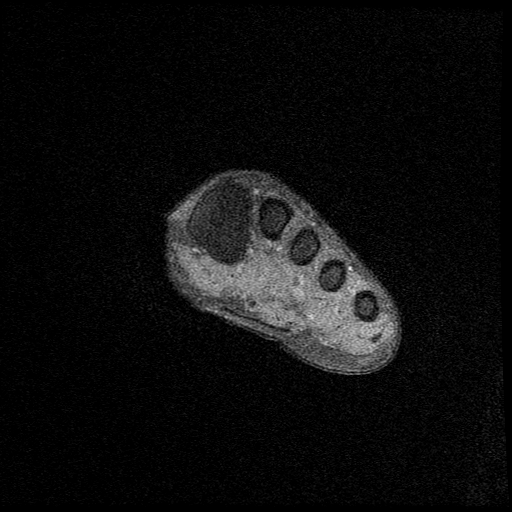
[im 44/44]
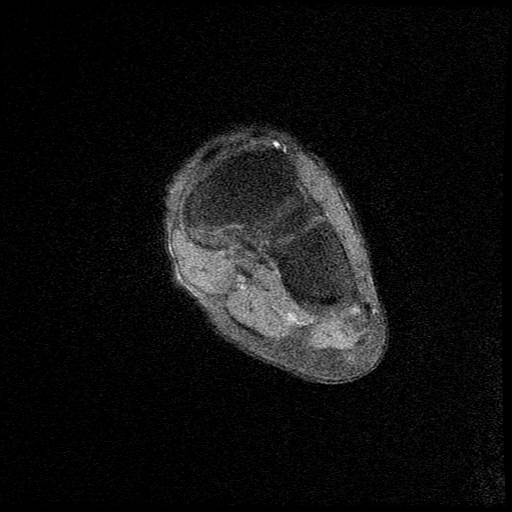

[Series 11: T1 fat-sat · coronal · 3.0mm · 0.29mm/px · 1 of 44 slices shown (2 of 2)]
[im 1/44]
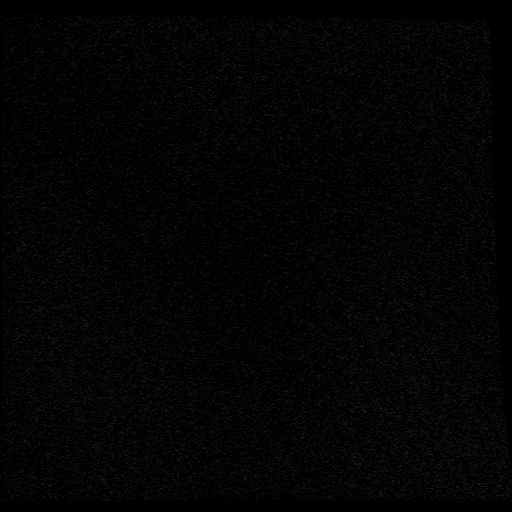

[19 of 40 positions shown; findings below may reference images not displayed]

FINDINGS: Bones/Joint/Cartilage

Healed first metatarsal neck osteotomy transfixed with 2 pins with
the metallic pins resulting in susceptibility artifact partially
obscuring the adjacent soft tissue and osseous structures.

Mild osteoarthritis of the first MTP joint. Minimal marrow edema in
the medial hallux sesamoid and adjacent metatarsal head likely
reflecting early degenerative changes versus mild sesamoiditis.

Normal alignment. No joint effusion.

Ligaments

Collateral ligaments are intact.  Lisfranc ligament is intact.

Muscles and Tendons

Flexor, peroneal and extensor compartment tendons are intact.
Muscles are normal.

Soft tissue
No fluid collection or hematoma.  No soft tissue mass.
IMPRESSION: 1.  No acute osseous injury of the left forefoot.
2. Healed first metatarsal neck osteotomy transfixed with 2 pins
with the metallic pins resulting in susceptibility artifact
partially obscuring the adjacent soft tissue and osseous structures.
Mild osteoarthritis of the first MTP joint.
3. Minimal marrow edema in the medial hallux sesamoid and adjacent
metatarsal head likely reflecting early degenerative changes versus
mild sesamoiditis.

## 2021-03-10 MED ORDER — GADOBUTROL 1 MMOL/ML IV SOLN
5.0000 mL | Freq: Once | INTRAVENOUS | Status: AC | PRN
  Administered 2021-03-10: 5 mL via INTRAVENOUS

## 2021-03-17 ENCOUNTER — Ambulatory Visit (INDEPENDENT_AMBULATORY_CARE_PROVIDER_SITE_OTHER): Payer: No Typology Code available for payment source | Admitting: Sports Medicine

## 2021-03-17 ENCOUNTER — Other Ambulatory Visit (HOSPITAL_COMMUNITY): Payer: Self-pay

## 2021-03-17 ENCOUNTER — Other Ambulatory Visit: Payer: Self-pay

## 2021-03-17 ENCOUNTER — Encounter: Payer: Self-pay | Admitting: Sports Medicine

## 2021-03-17 ENCOUNTER — Ambulatory Visit
Admission: RE | Admit: 2021-03-17 | Discharge: 2021-03-17 | Disposition: A | Discharge status: Home / Self Care | Payer: No Typology Code available for payment source | Source: Ambulatory Visit | Attending: Internal Medicine | Admitting: Internal Medicine

## 2021-03-17 DIAGNOSIS — M21619 Bunion of unspecified foot: Secondary | ICD-10-CM | POA: Diagnosis not present

## 2021-03-17 DIAGNOSIS — M79672 Pain in left foot: Secondary | ICD-10-CM

## 2021-03-17 DIAGNOSIS — Z1231 Encounter for screening mammogram for malignant neoplasm of breast: Secondary | ICD-10-CM

## 2021-03-17 DIAGNOSIS — M779 Enthesopathy, unspecified: Secondary | ICD-10-CM | POA: Diagnosis not present

## 2021-03-17 DIAGNOSIS — M25872 Other specified joint disorders, left ankle and foot: Secondary | ICD-10-CM | POA: Diagnosis not present

## 2021-03-17 DIAGNOSIS — Z9889 Other specified postprocedural states: Secondary | ICD-10-CM

## 2021-03-17 IMAGING — MG MM DIGITAL SCREENING BILAT W/ TOMO AND CAD
8 series · 9 of 24 positions shown · non-contrast
Comparison: Previous exam(s).

CLINICAL DATA: Screening.

EXAM:
DIGITAL SCREENING BILATERAL MAMMOGRAM WITH TOMOSYNTHESIS AND CAD
TECHNIQUE: Bilateral screening digital craniocaudal and mediolateral oblique
mammograms were obtained. Bilateral screening digital breast
tomosynthesis was performed. The images were evaluated with
computer-aided detection.

[L CC synth-2D]
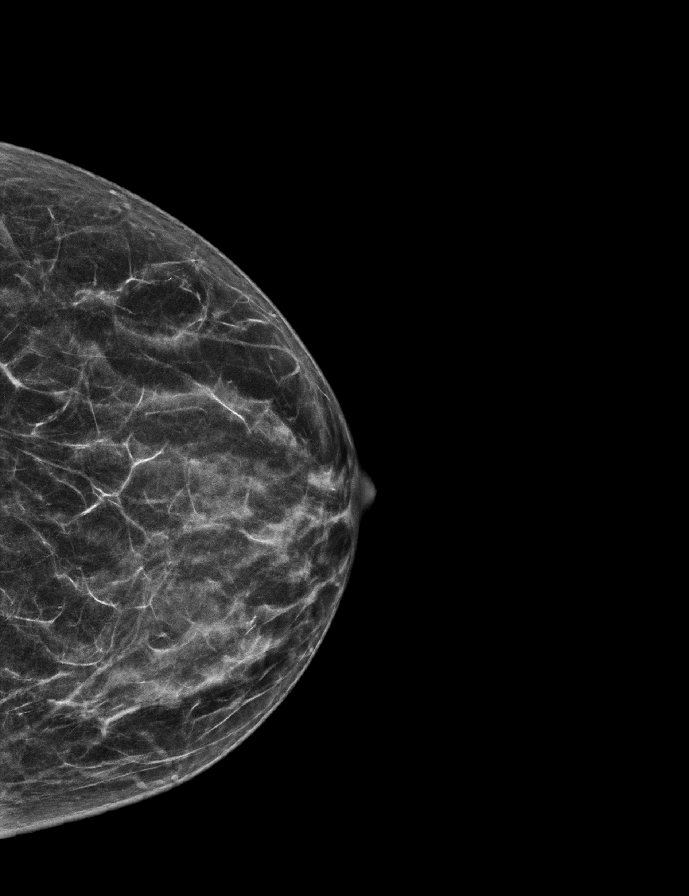

[R CC synth-2D]
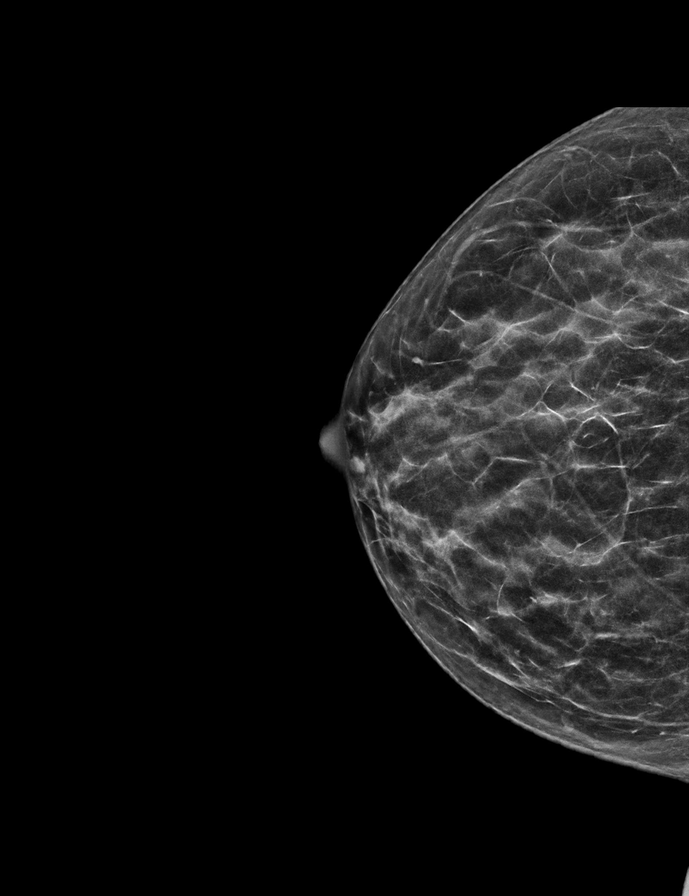

[R MLO synth-2D]
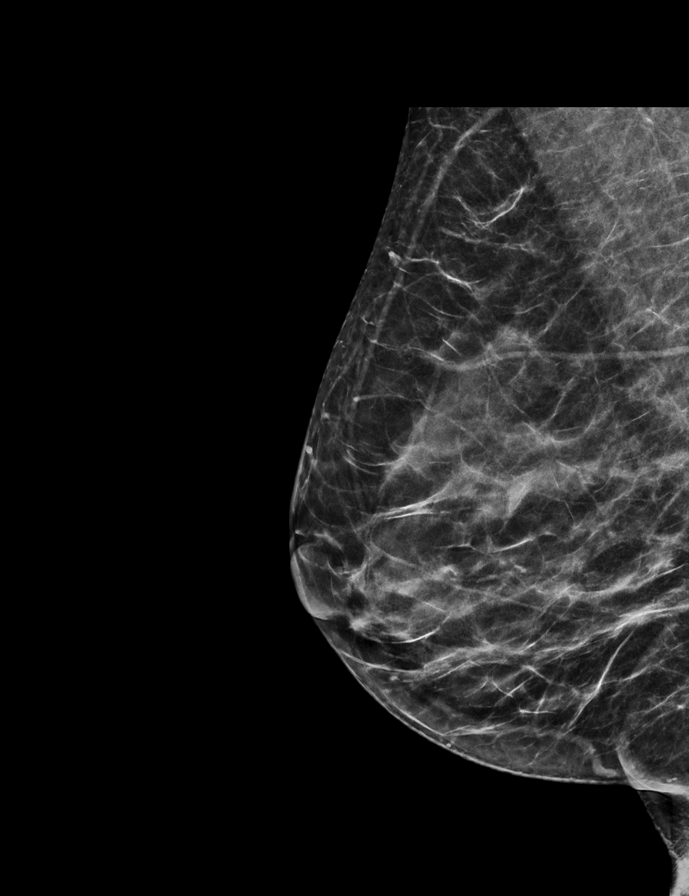

[L MLO synth-2D]
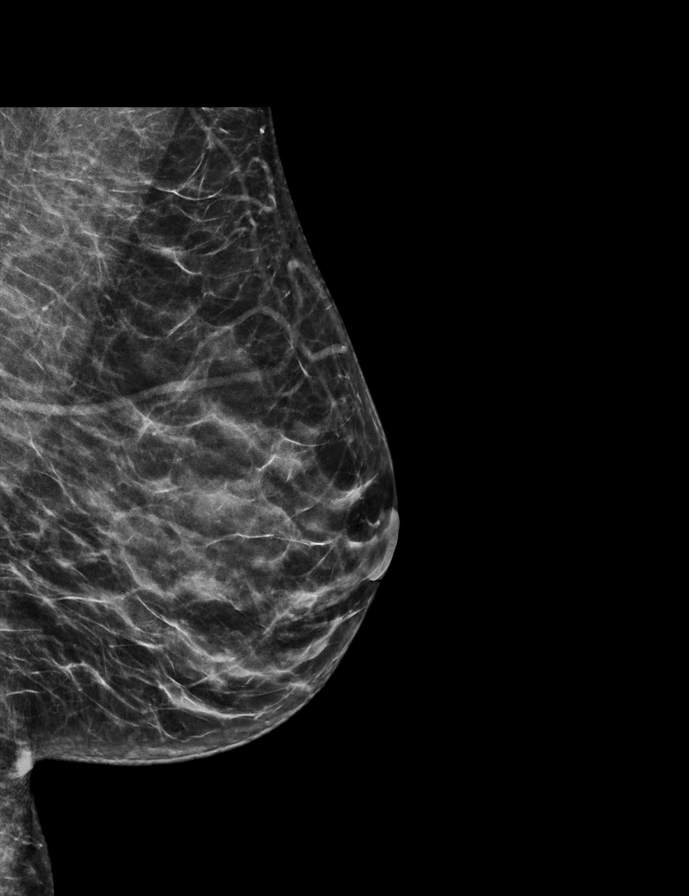

[L MLO tomo · 2 of 55 frames shown]
[frame 18/55]
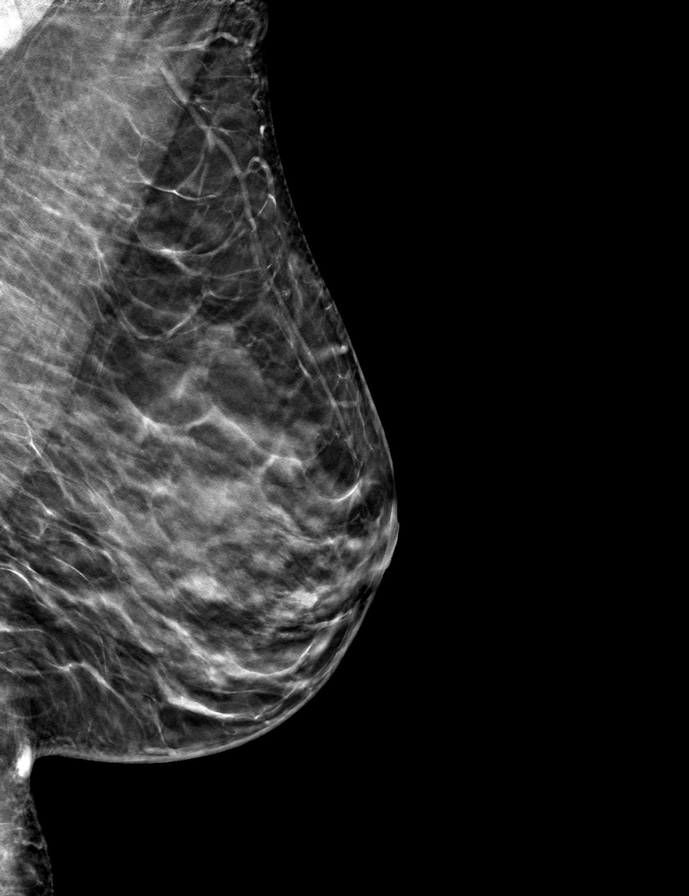
[frame 28/55]
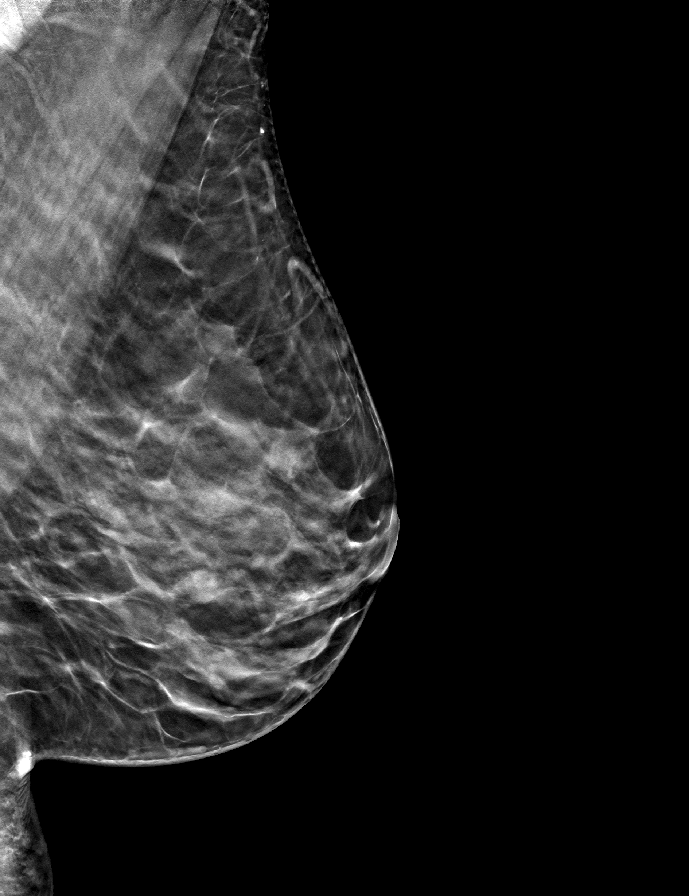

[R CC tomo · tomo slice 26/51.0]
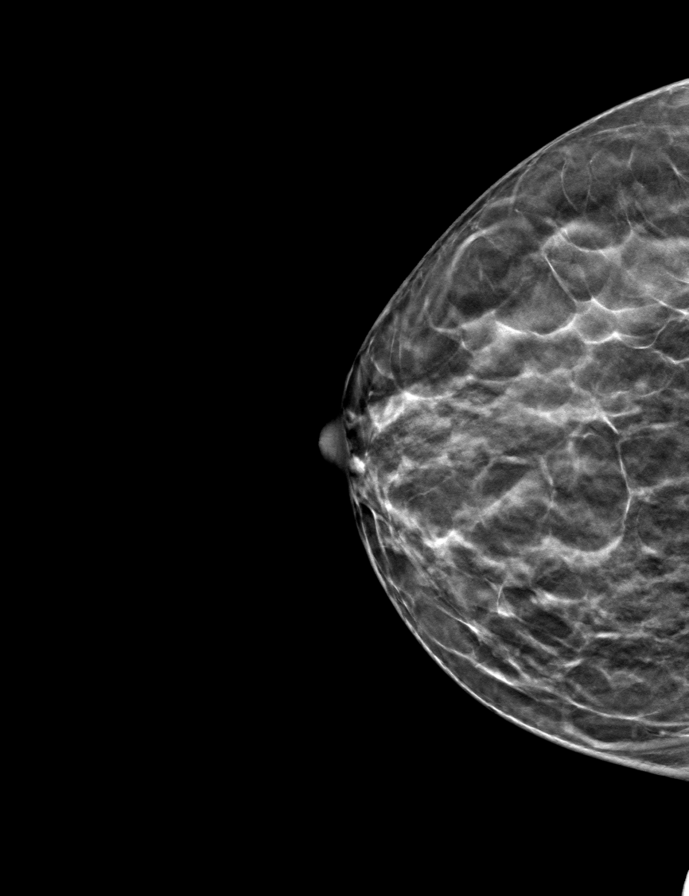

[R MLO tomo · tomo slice 27/54.0]
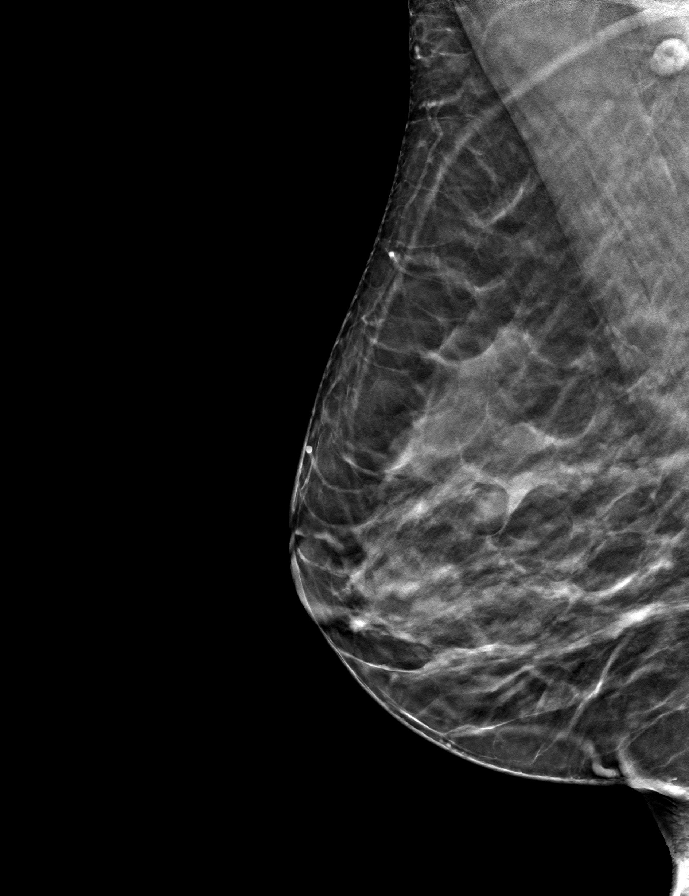

[L CC tomo · tomo slice 25/50.0]
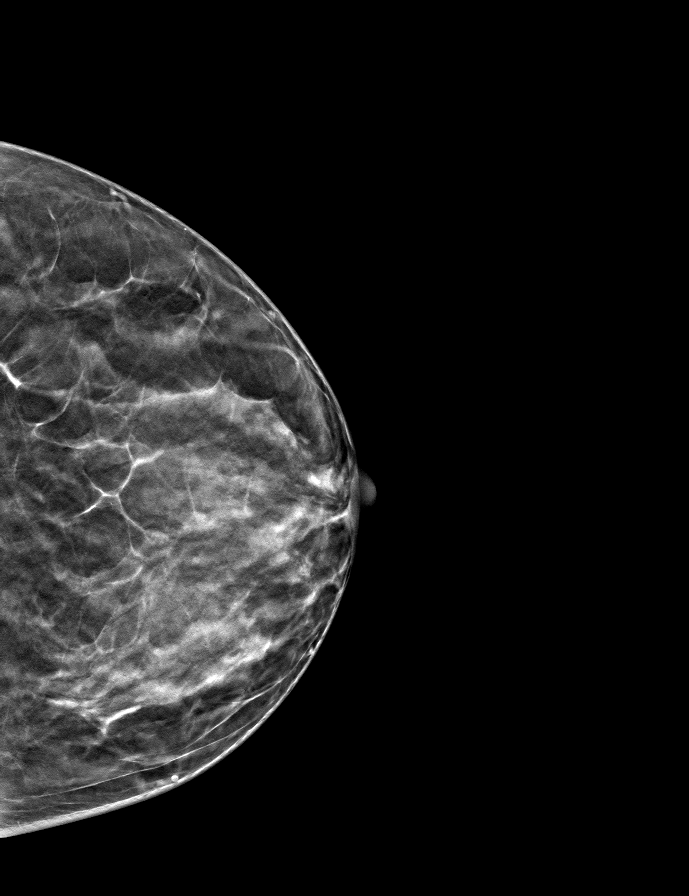

[9 of 24 positions shown; findings below may reference images not displayed]

ACR Breast Density Category c: The breast tissue is heterogeneously
dense, which may obscure small masses.
FINDINGS: There are no findings suspicious for malignancy.
IMPRESSION: No mammographic evidence of malignancy. A result letter of this
screening mammogram will be mailed directly to the patient.

RECOMMENDATION:
Screening mammogram in one year. (Code:[V2])

BI-RADS CATEGORY  1: Negative.

## 2021-03-17 MED ORDER — PREDNISONE 10 MG (21) PO TBPK
ORAL_TABLET | ORAL | 0 refills | Status: DC
  Filled 2021-03-17: qty 21, 6d supply, fill #0

## 2021-03-17 NOTE — Progress Notes (Signed)
  Subjective: Melissa Blankenship is a 40 y.o. female patient seen today in office for follow up post op care. Patient is a patient of Dr. Charlsie Merles who underwent left [place]in bunionectomy on 06/22/20.  Patient has completed physical therapy and reports that she is still having pain worse underneath the second metatarsal greater than the first metatarsal currently.  Patient is also here for review of MRI results.  Patient denies any other pedal complaints at this time.  Patient Active Problem List   Diagnosis Date Noted   Tobacco use disorder 02/10/2021   Health care maintenance 06/01/2020   Bunion 05/14/2020   GAD (generalized anxiety disorder) with panic attacks 01/09/2019   Asthma 01/09/2019    Current Outpatient Medications on File Prior to Visit  Medication Sig Dispense Refill   albuterol (VENTOLIN HFA) 108 (90 Base) MCG/ACT inhaler Inhale 2 puffs into the lungs every 6 (six) hours as needed for wheezing or shortness of breath. 8 g 1   ALPRAZolam (XANAX) 0.25 MG tablet Take 1-2 tablets (0.25-0.5 mg total) by mouth 3 (three) times daily as needed (Panic attacks). 90 tablet 0   escitalopram (LEXAPRO) 20 MG tablet Take 1 tablet (20 mg total) by mouth daily. 30 tablet 3   meloxicam (MOBIC) 15 MG tablet TAKE 1 TABLET (15 MG TOTAL) BY MOUTH DAILY. 30 tablet 2   No current facility-administered medications on file prior to visit.    No Known Allergies  Objective: There were no vitals filed for this visit.  General: No acute distress, AAOx3  Left foot: Incision well-healed, minimal swelling to 1st ray, decreased hypersensitivity 1st ray, however there is some sensitivity and pain sesamoid complex and plantar second toe along the flexor tendon course as previously noted likely this is secondary to transfer pain/overload/capsulitis which is the area of most pain on today's visit, capillary fill time <3 seconds in all digits, gross sensation present via light touch to left foot except with decreased  numbness over incision as previously noted but decreased sharp shooting pains over the surgical site.  Minimal pain with range of motion left 1st MTPJ and with direct palpation to sesamoid area that remains tender, range of motion slightly guarded this morning patient has just got off of work with dorsiflexion of 35 degrees and plantarflexion of 5 degrees.  No pain with calf compression.    Assessment and Plan:  Problem List Items Addressed This Visit       Musculoskeletal and Integument   Bunion (Chronic)   Other Visit Diagnoses     S/P foot surgery, left    -  Primary   Capsulitis       Sesamoiditis of left foot       Left foot pain            -Patient seen and evaluated -MRI reviewed with patient which shows underlying arthritis that is very mild previous bunionectomy site there is also inflammation noted at the sesamoid complex consistent with sesamoiditis -Patient at this time is declining plantar steroid injection -Rx oral prednisone for patient to take as directed -FMLA accommodation paperwork completed for patient to allow patient to continue with light duty to 06/17/2021 with use of cam boot and to allow for 1 flareup every 2 months as needed for foot pain -Will plan for follow-up evaluation 4 to 6 weeks and advised patient long-term she may need orthotics  Asencion Islam, DPM

## 2021-03-25 ENCOUNTER — Encounter: Payer: Self-pay | Admitting: Sports Medicine

## 2021-04-14 ENCOUNTER — Ambulatory Visit: Payer: No Typology Code available for payment source | Admitting: Sports Medicine

## 2021-04-21 ENCOUNTER — Encounter (HOSPITAL_COMMUNITY): Payer: Self-pay | Admitting: Emergency Medicine

## 2021-04-21 ENCOUNTER — Emergency Department (HOSPITAL_BASED_OUTPATIENT_CLINIC_OR_DEPARTMENT_OTHER)
Admission: EM | Admit: 2021-04-21 | Discharge: 2021-04-21 | Disposition: A | Discharge status: Home / Self Care | Payer: No Typology Code available for payment source | Source: Home / Self Care | Attending: Emergency Medicine | Admitting: Emergency Medicine

## 2021-04-21 ENCOUNTER — Emergency Department (HOSPITAL_COMMUNITY): Payer: No Typology Code available for payment source

## 2021-04-21 ENCOUNTER — Encounter (HOSPITAL_BASED_OUTPATIENT_CLINIC_OR_DEPARTMENT_OTHER): Payer: Self-pay | Admitting: Obstetrics and Gynecology

## 2021-04-21 ENCOUNTER — Emergency Department (HOSPITAL_COMMUNITY)
Admission: EM | Admit: 2021-04-21 | Discharge: 2021-04-21 | Discharge status: Against Medical Advice | Payer: No Typology Code available for payment source | Attending: Emergency Medicine | Admitting: Emergency Medicine

## 2021-04-21 ENCOUNTER — Other Ambulatory Visit: Payer: Self-pay

## 2021-04-21 DIAGNOSIS — J45909 Unspecified asthma, uncomplicated: Secondary | ICD-10-CM | POA: Insufficient documentation

## 2021-04-21 DIAGNOSIS — G43109 Migraine with aura, not intractable, without status migrainosus: Secondary | ICD-10-CM | POA: Insufficient documentation

## 2021-04-21 DIAGNOSIS — R739 Hyperglycemia, unspecified: Secondary | ICD-10-CM | POA: Diagnosis not present

## 2021-04-21 DIAGNOSIS — R519 Headache, unspecified: Secondary | ICD-10-CM | POA: Insufficient documentation

## 2021-04-21 DIAGNOSIS — H538 Other visual disturbances: Secondary | ICD-10-CM | POA: Diagnosis not present

## 2021-04-21 DIAGNOSIS — R200 Anesthesia of skin: Secondary | ICD-10-CM | POA: Insufficient documentation

## 2021-04-21 DIAGNOSIS — F17210 Nicotine dependence, cigarettes, uncomplicated: Secondary | ICD-10-CM | POA: Insufficient documentation

## 2021-04-21 DIAGNOSIS — Z20822 Contact with and (suspected) exposure to covid-19: Secondary | ICD-10-CM | POA: Diagnosis not present

## 2021-04-21 LAB — CBC WITH DIFFERENTIAL/PLATELET
Abs Immature Granulocytes: 0.06 10*3/uL (ref 0.00–0.07)
Basophils Absolute: 0 10*3/uL (ref 0.0–0.1)
Basophils Relative: 0 %
Eosinophils Absolute: 0 10*3/uL (ref 0.0–0.5)
Eosinophils Relative: 0 %
HCT: 40.3 % (ref 36.0–46.0)
Hemoglobin: 12.7 g/dL (ref 12.0–15.0)
Immature Granulocytes: 1 %
Lymphocytes Relative: 16 %
Lymphs Abs: 1.9 10*3/uL (ref 0.7–4.0)
MCH: 28.9 pg (ref 26.0–34.0)
MCHC: 31.5 g/dL (ref 30.0–36.0)
MCV: 91.6 fL (ref 80.0–100.0)
Monocytes Absolute: 0.6 10*3/uL (ref 0.1–1.0)
Monocytes Relative: 5 %
Neutro Abs: 9.7 10*3/uL — ABNORMAL HIGH (ref 1.7–7.7)
Neutrophils Relative %: 78 %
Platelets: 195 10*3/uL (ref 150–400)
RBC: 4.4 MIL/uL (ref 3.87–5.11)
RDW: 13.2 % (ref 11.5–15.5)
WBC: 12.2 10*3/uL — ABNORMAL HIGH (ref 4.0–10.5)
nRBC: 0 % (ref 0.0–0.2)

## 2021-04-21 LAB — I-STAT BETA HCG BLOOD, ED (MC, WL, AP ONLY): I-stat hCG, quantitative: <5 m[IU]/mL (ref <5)

## 2021-04-21 LAB — RESP PANEL BY RT-PCR (FLU A&B, COVID) ARPGX2
Influenza A by PCR: NEGATIVE
Influenza B by PCR: NEGATIVE
SARS Coronavirus 2 by RT PCR: NEGATIVE

## 2021-04-21 LAB — SEDIMENTATION RATE: Sed Rate: 0 mm/hr (ref 0–22)

## 2021-04-21 LAB — BASIC METABOLIC PANEL
Anion gap: 8 (ref 5–15)
BUN: 9 mg/dL (ref 6–20)
CO2: 22 mmol/L (ref 22–32)
Calcium: 9.1 mg/dL (ref 8.9–10.3)
Chloride: 107 mmol/L (ref 98–111)
Creatinine, Ser: 0.67 mg/dL (ref 0.44–1.00)
GFR, Estimated: >60 mL/min (ref >60)
Glucose, Bld: 149 mg/dL — ABNORMAL HIGH (ref 70–99)
Potassium: 3.7 mmol/L (ref 3.5–5.1)
Sodium: 137 mmol/L (ref 135–145)

## 2021-04-21 LAB — C-REACTIVE PROTEIN: CRP: 0.6 mg/dL (ref <1.0)

## 2021-04-21 IMAGING — CT CT HEAD W/O CM
4 series · 17 of 47 positions shown, 19 images · non-contrast
Comparison: No prior imaging is available for review.

CLINICAL DATA: Headache, new or worsening.

EXAM:
CT HEAD WITHOUT CONTRAST
TECHNIQUE: Contiguous axial images were obtained from the base of the skull
through the vertex without intravenous contrast.

[Series 3: head wo · axial · 0.37mm/px · z∈[-138,-18]mm · 7 of 33 slices shown, 9 images]
[im 5/33  brain]
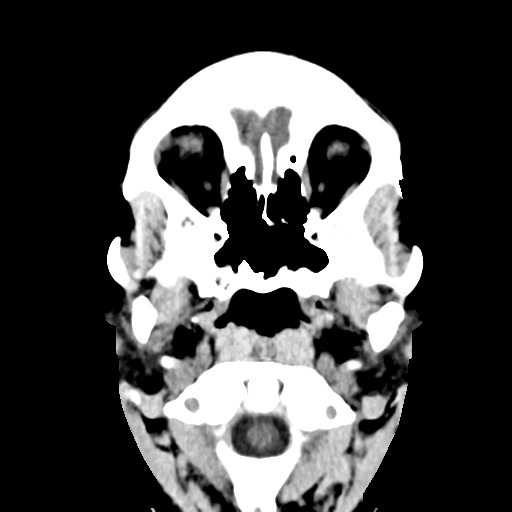
[im 5/33  bone]
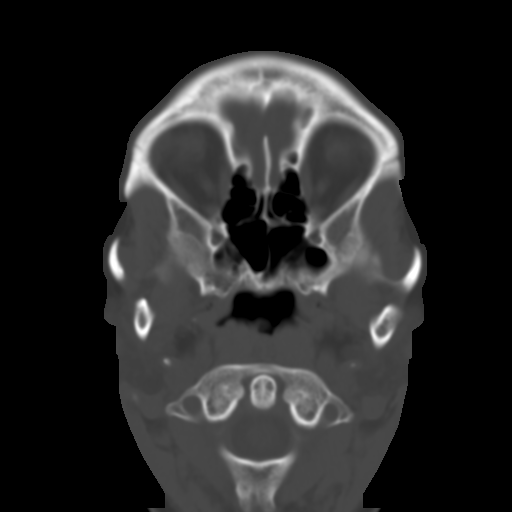
[im 9/33  brain]
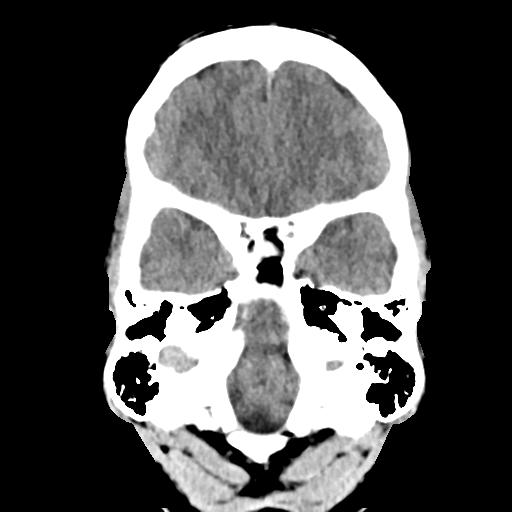
[im 13/33  brain]
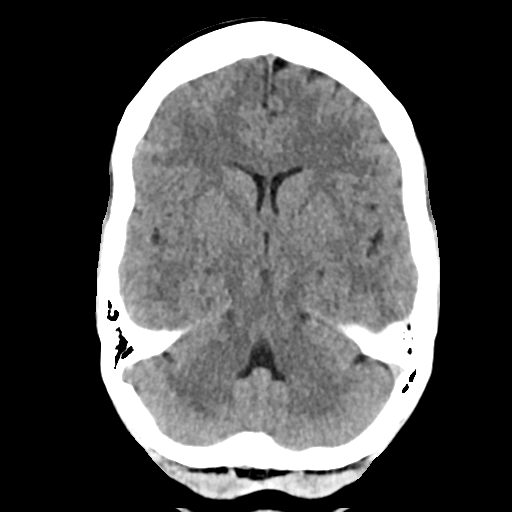
[im 17/33  brain]
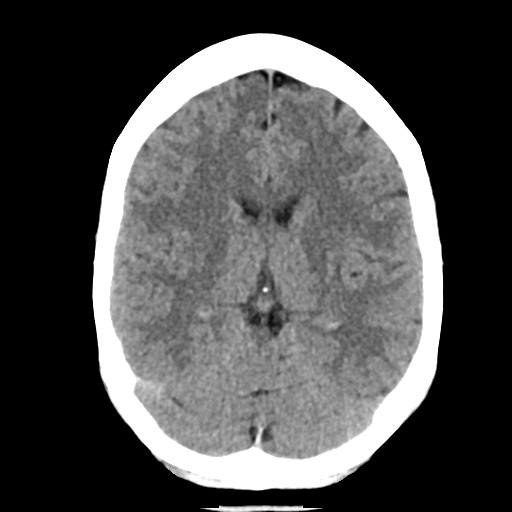
[im 21/33  brain]
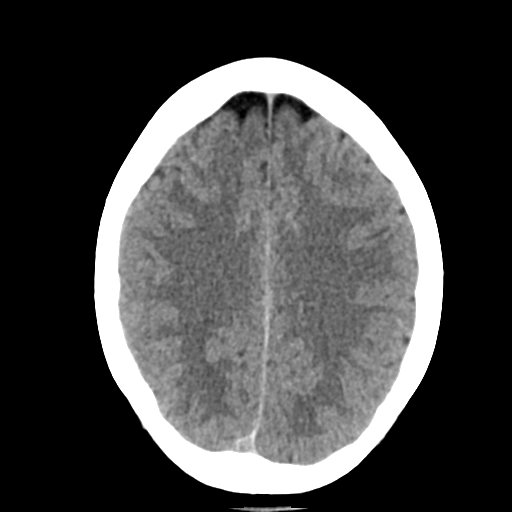
[im 21/33  bone]
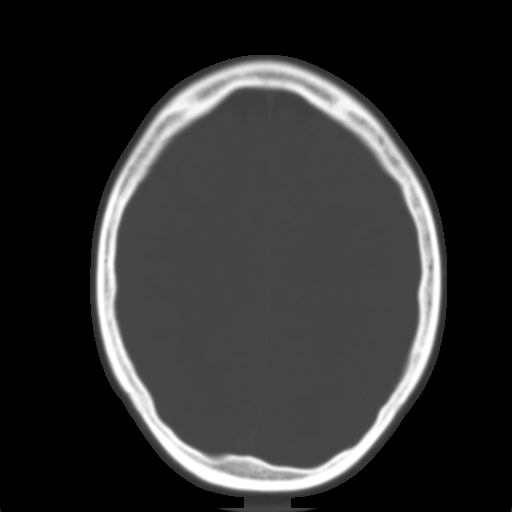
[im 25/33  brain]
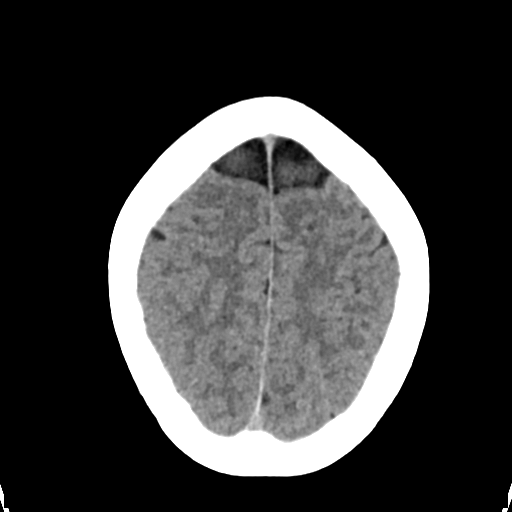
[im 29/33  brain]
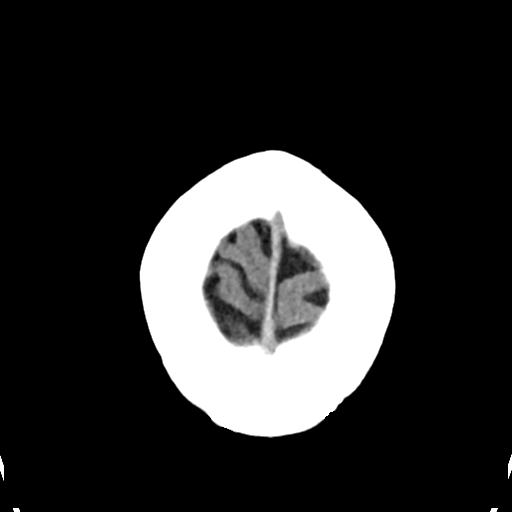

[Series 4: head bone · axial · 0.37mm/px · z∈[-142,-86]mm · 4 of 82 slices shown]
[im 9/82  bone]
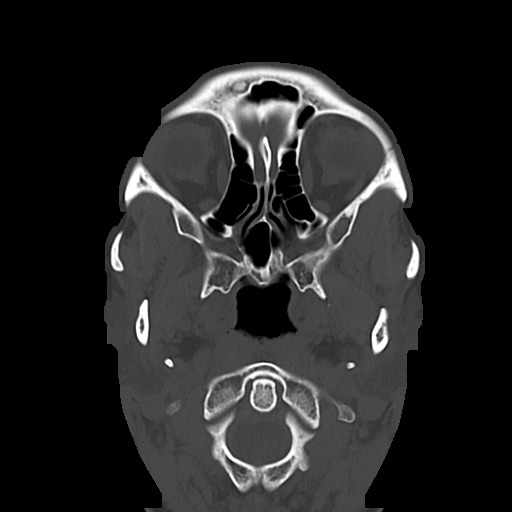
[im 17/82  bone]
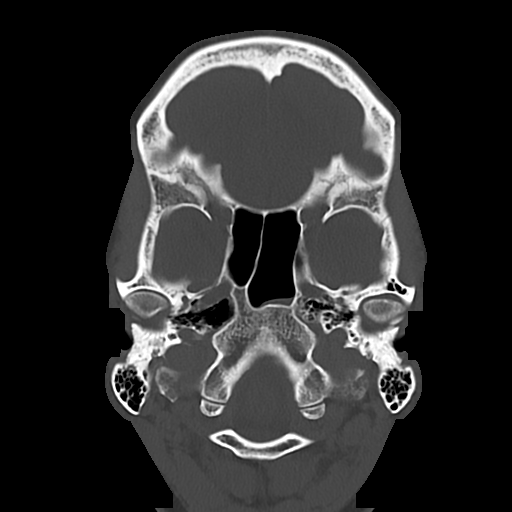
[im 25/82  bone]
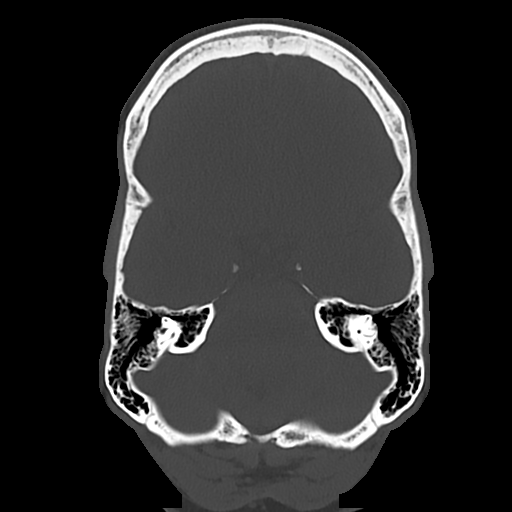
[im 37/82  bone]
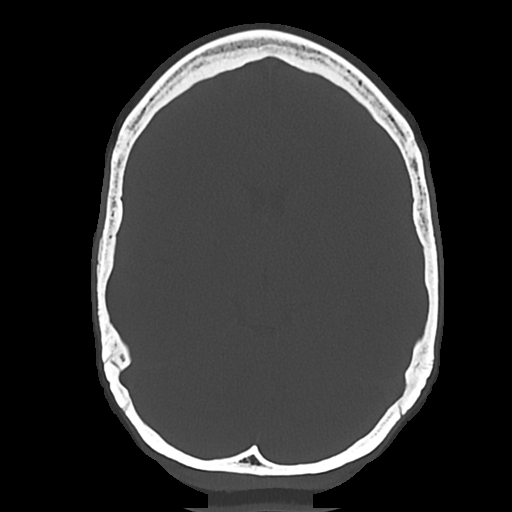

[Series 5: cor soft · coronal · 0.30mm/px · 3 of 64 slices shown]
[im 22/64  brain]
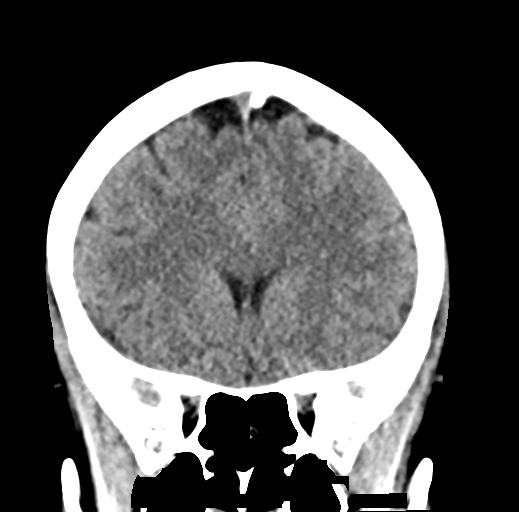
[im 29/64  brain]
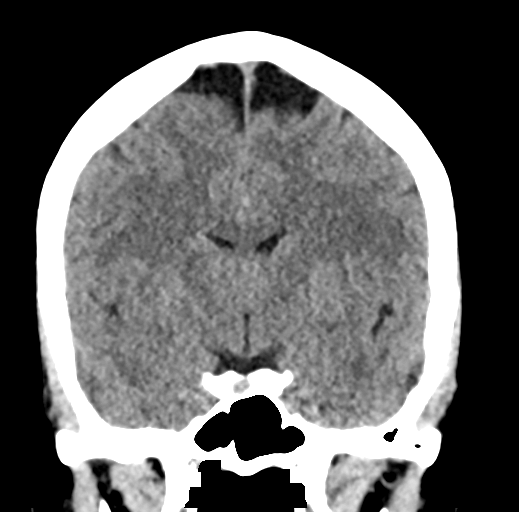
[im 36/64  brain]
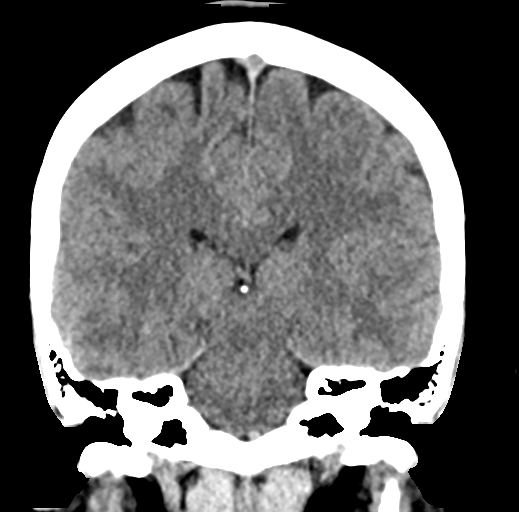

[Series 6: sag soft · sagittal · 0.30mm/px · 3 of 50 slices shown]
[im 17/50  brain]
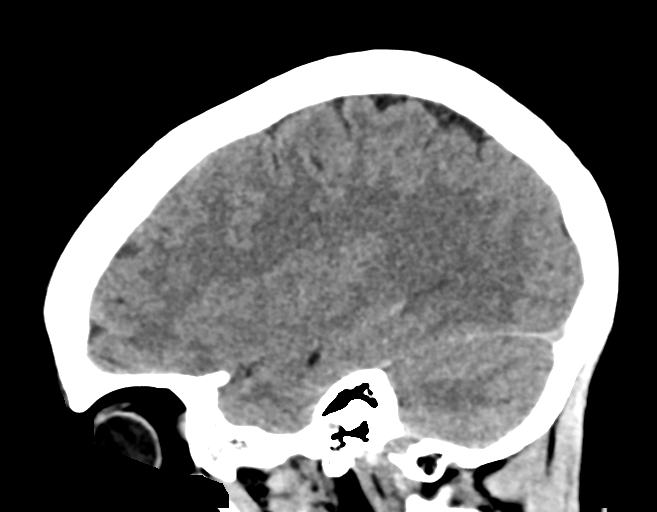
[im 25/50  brain]
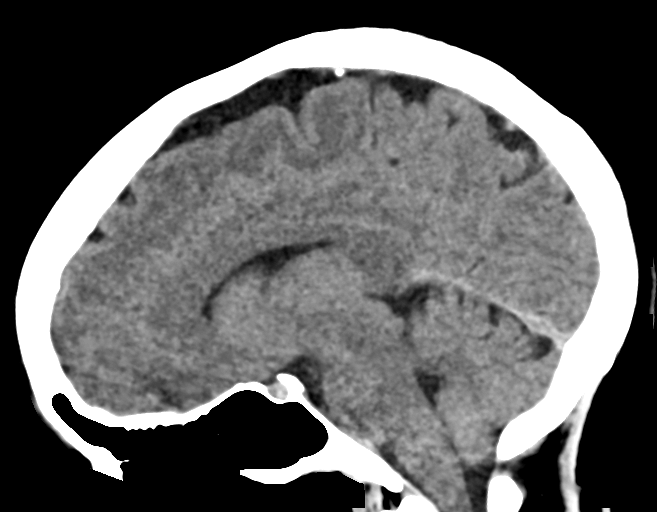
[im 33/50  brain]
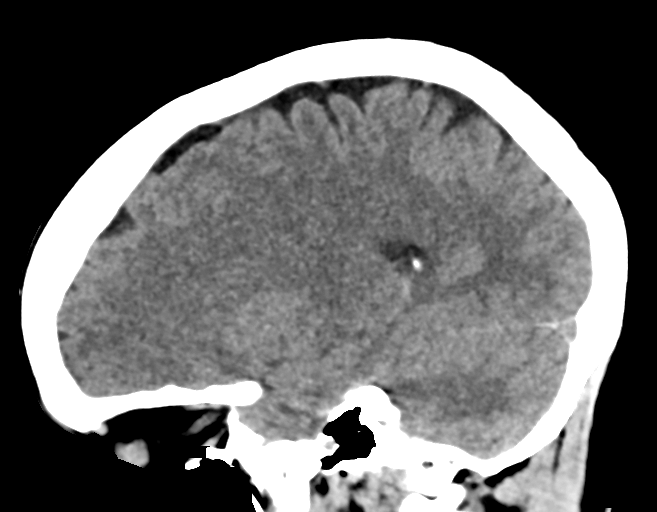

[17 of 47 positions shown; findings below may reference images not displayed]

FINDINGS: Brain: No acute intracranial hemorrhage, midline shift or mass
effect. No extra-axial fluid collection. Gray-white matter
differentiation is within normal limits and there is no
hydrocephalus.

Vascular: No hyperdense vessel or unexpected calcification.

Skull: Normal. Negative for fracture or focal lesion.

Sinuses/Orbits: No acute finding.

Other: None.
IMPRESSION: No acute intracranial process.

## 2021-04-21 MED ORDER — DIPHENHYDRAMINE HCL 50 MG/ML IJ SOLN
12.5000 mg | Freq: Once | INTRAMUSCULAR | Status: AC
  Administered 2021-04-21: 12.5 mg via INTRAVENOUS
  Filled 2021-04-21: qty 1

## 2021-04-21 MED ORDER — SODIUM CHLORIDE 0.9 % IV BOLUS
1000.0000 mL | Freq: Once | INTRAVENOUS | Status: AC
  Administered 2021-04-21: 1000 mL via INTRAVENOUS

## 2021-04-21 MED ORDER — METOCLOPRAMIDE HCL 5 MG/ML IJ SOLN
10.0000 mg | Freq: Once | INTRAMUSCULAR | Status: AC
  Administered 2021-04-21: 10 mg via INTRAVENOUS
  Filled 2021-04-21: qty 2

## 2021-04-21 MED ORDER — ACETAMINOPHEN 500 MG PO TABS
1000.0000 mg | ORAL_TABLET | Freq: Once | ORAL | Status: AC
  Administered 2021-04-21: 1000 mg via ORAL
  Filled 2021-04-21: qty 2

## 2021-04-21 MED ORDER — DEXAMETHASONE SODIUM PHOSPHATE 10 MG/ML IJ SOLN
10.0000 mg | Freq: Once | INTRAMUSCULAR | Status: AC
  Administered 2021-04-21: 10 mg via INTRAVENOUS
  Filled 2021-04-21: qty 1

## 2021-04-21 MED ORDER — PROCHLORPERAZINE EDISYLATE 10 MG/2ML IJ SOLN: 10.0000 mg | Freq: Once | INTRAMUSCULAR | Status: DC

## 2021-04-21 MED ORDER — DIPHENHYDRAMINE HCL 50 MG/ML IJ SOLN
25.0000 mg | Freq: Once | INTRAMUSCULAR | Status: AC
  Administered 2021-04-21: 25 mg via INTRAVENOUS
  Filled 2021-04-21: qty 1

## 2021-04-21 MED ORDER — PROCHLORPERAZINE EDISYLATE 10 MG/2ML IJ SOLN
10.0000 mg | Freq: Once | INTRAMUSCULAR | Status: AC
  Administered 2021-04-21: 10 mg via INTRAVENOUS
  Filled 2021-04-21: qty 2

## 2021-04-21 NOTE — ED Triage Notes (Signed)
Pt was at work [employer] and told her charge nurse about headache she has had since yesterday with facial numbness and blurry vision. Pt states migraine hx is on the right and this headache and blurred vision are on the left.  No n/v/d. Has tried 800 mg ibuprofen at home yesterday but nothing today.

## 2021-04-21 NOTE — ED Provider Notes (Signed)
[facility] EMERGENCY DEPARTMENT Provider Note   CSN: [id number] Arrival date & time: 04/21/21  0003     History Chief Complaint  Patient presents with   Headache   Blurred Vision    Zsazsa JAYLNN ULLERY is a 40 y.o. female with past medical history who presents for evaluation of headache, weakness.  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  States 2 days ago she developed a frontal left-sided headache. Has history of migraines however stated this felt different.  She denied any symptoms of "sudden onset thunderclap" headache.  She also developed some vision changes to her left eye (blurring) as well as pressure surrounding her eye. No hemifield cuts. She felt like her left mouth was weak and she had some decrease sensation in her left upper and lower extremity.  No prior history of CVA, MS.  Denies additional rating or alleviating factors.  She took Tylenol without relief of her headache.  She rates her headache a 9/10.  She denies any recent traumatic injuries.  No fever, neck pain, neck stiffness chest pain, shortness of breath, abdominal pain.  Does have some slight photophobia without phonophobia.  History obtained from patient and past medical records.  No interpreter is used.  HPI     Past Medical History:  Diagnosis Date   Anxiety    Depression    GERD (gastroesophageal reflux disease)    Thyroid nodule    Viral upper respiratory tract infection 07/02/2020    Patient Active Problem List   Diagnosis Date Noted   Tobacco use disorder 02/10/2021   Health care maintenance 06/01/2020   Bunion 05/14/2020   GAD (generalized anxiety disorder) with panic attacks 01/09/2019   Asthma 01/09/2019    Past Surgical History:  Procedure Laterality Date   ABDOMINAL HYSTERECTOMY     BUNIONECTOMY Left 06/22/2020   INCISION AND DRAINAGE / EXCISION THYROGLOSSAL CYST  2019   path report revealed hyperplastic thyroid tissue, non-malignant    TUBAL LIGATION       OB History   No  obstetric history on file.     Family History  Problem Relation Age of Onset   Lung cancer Mother    Diabetes Mellitus II Mother    Hypertension Mother    Diabetes type II Father    Hypertension Father    CAD Maternal Grandfather     Social History   Tobacco Use   Smoking status: Some Days    Packs/day: 0.10    Types: Cigarettes   Smokeless tobacco: Never   Tobacco comments:    1 pk per month   Substance Use Topics   Alcohol use: No   Drug use: No    Home Medications Prior to Admission medications   Medication Sig Start Date End Date Taking? Authorizing Provider  albuterol (VENTOLIN HFA) 108 (90 Base) MCG/ACT inhaler Inhale 2 puffs into the lungs every 6 (six) hours as needed for wheezing or shortness of breath. 01/09/19  Yes Christian, Rylee, MD  ALPRAZolam (XANAX) 0.25 MG tablet Take 1-2 tablets (0.25-0.5 mg total) by mouth 3 (three) times daily as needed (Panic attacks). 12/24/20  Yes Verdene Lennert, MD  escitalopram (LEXAPRO) 20 MG tablet Take 1 tablet (20 mg total) by mouth daily. Patient taking differently: Take 20 mg by mouth daily as needed (anxiety). 12/24/20  Yes Verdene Lennert, MD  ibuprofen (ADVIL) 800 MG tablet Take 800 mg by mouth every 8 (eight) hours as needed for headache or mild pain.   Yes  [provider]  predniSONE (STERAPRED UNI-PAK 21 TAB) 10 MG (21) TBPK tablet Take as directed on patient package Patient taking differently: Take 10 mg by mouth daily. 03/17/21  Yes Stover, Titorya, DPM  meloxicam (MOBIC) 15 MG tablet TAKE 1 TABLET (15 MG TOTAL) BY MOUTH DAILY. Patient not taking: No sig reported 07/22/20 07/22/21  Lenn Sink, DPM    Allergies    Patient has no known allergies.  Review of Systems   Review of Systems  Constitutional: Negative.   HENT: Negative.    Respiratory: Negative.    Cardiovascular: Negative.   Gastrointestinal: Negative.   Genitourinary: Negative.   Musculoskeletal: Negative.   Skin: Negative.    Neurological:  Positive for weakness, numbness and headaches. Negative for seizures and speech difficulty.  All other systems reviewed and are negative.  Physical Exam Updated Vital Signs BP 121/74   Pulse (!) 56   Temp 98.1 F (36.7 C) (Oral)   Resp 12   LMP 04/29/2011   SpO2 100%   Physical Exam Physical Exam  Constitutional: Pt is oriented to person, place, and time. Pt appears well-developed and well-nourished. No distress.  HENT:  Head: Normocephalic and atraumatic.  Mouth/Throat: Oropharynx is clear and moist.  Eyes: Conjunctivae and EOM are normal. Pupils are equal, round, and reactive to light. No scleral icterus.  No horizontal, vertical or rotational nystagmus  Neck: Normal range of motion. Neck supple.  Full active and passive ROM without pain No midline or paraspinal tenderness No nuchal rigidity or meningeal signs  Cardiovascular: Normal rate, regular rhythm and intact distal pulses.   Pulmonary/Chest: Effort normal and breath sounds normal. No respiratory distress. Pt has no wheezes. No rales.  Abdominal: Soft. Bowel sounds are normal. There is no tenderness. There is no rebound and no guarding.  Musculoskeletal: Normal range of motion.  Lymphadenopathy:    No cervical adenopathy.  Neurological: Pt. is alert and oriented to person, place, and time. He has normal reflexes. No cranial nerve deficit.  Exhibits normal muscle tone. Coordination normal.  Mental Status:  Alert, oriented, thought content appropriate. Speech fluent without evidence of aphasia. Able to follow 2 step commands without difficulty.  Cranial Nerves:  II:  Peripheral visual fields grossly normal, pupils equal, round, reactive to light III,IV, VI: ptosis not present, extra-ocular motions intact bilaterally  V,VII: smile symmetric, facial light touch sensation decreased on left face, Upper and lower quadrants VIII: hearing grossly normal bilaterally  IX,X: midline uvula rise  XI: bilateral  shoulder shrug equal and strong XII: midline tongue extension  Motor:  4.5/5 strength in grip to LUE 5/5 RUE grip Sensory: Decreased sensation left face, left arm Cerebellar: normal finger-to-nose with bilateral upper extremities Gait: normal gait and balance CV: distal pulses palpable throughout   Skin: Skin is warm and dry. No rash noted. Pt is not diaphoretic.  Psychiatric: Pt has a normal mood and affect. Behavior is normal. Judgment and thought content normal.  Nursing note and vitals reviewed.  ED Results / Procedures / Treatments   Labs (all labs ordered are listed, but only abnormal results are displayed) Labs Reviewed  CBC WITH DIFFERENTIAL/PLATELET - Abnormal; Notable for the following components:      Result Value   WBC 12.2 (*)    Neutro Abs 9.7 (*)    All other components within normal limits  BASIC METABOLIC PANEL - Abnormal; Notable for the following components:   Glucose, Bld 149 (*)    All other components within normal  limits  RESP PANEL BY RT-PCR (FLU A&B, COVID) ARPGX2  URINALYSIS, ROUTINE W REFLEX MICROSCOPIC  I-STAT BETA HCG BLOOD, ED ([facility], [facility], [facility] ONLY)    EKG None  Radiology CT HEAD WO CONTRAST ( )  Result Date: 04/21/2021 CLINICAL DATA:  Headache, new or worsening. EXAM: CT HEAD WITHOUT CONTRAST TECHNIQUE: Contiguous axial images were obtained from the base of the skull through the vertex without intravenous contrast. COMPARISON:  No prior imaging is available for review. FINDINGS: Brain: No acute intracranial hemorrhage, midline shift or mass effect. No extra-axial fluid collection. Gray-white matter differentiation is within normal limits and there is no hydrocephalus. Vascular: No hyperdense vessel or unexpected calcification. Skull: Normal. Negative for fracture or focal lesion. Sinuses/Orbits: No acute finding. Other: None. IMPRESSION: No acute intracranial process. Electronically Signed   By: Thornell Sartorius M.D.   On: 04/21/2021 01:16     Procedures Procedures   Medications Ordered in ED Medications  acetaminophen (TYLENOL) tablet 1,000 mg (1,000 mg Oral Given 04/21/21 0324)  diphenhydrAMINE (BENADRYL) injection 12.5 mg (12.5 mg Intravenous Given 04/21/21 0900)  sodium chloride 0.9 % bolus 1,000 mL (0 mLs Intravenous Stopped 04/21/21 0937)  prochlorperazine (COMPAZINE) injection 10 mg (10 mg Intravenous Given 04/21/21 0859)    ED Course  I have reviewed the triage vital signs and the nursing notes.  Pertinent labs & imaging results that were available during my care of the patient were reviewed by me and considered in my medical decision making (see chart for details).  Here for evaluation of headache and neurologic deficits. On arrival she is outside code stroke, LVO window.  She is afebrile, nonseptic, not ill-appearing.  Does have some decrease sensation to left side of face, arm.  Slight decrease in grip strength to left.  Work-up started from triage which I personally reviewed and interpreted:  CT head without acute findings CBC without leukocytosis BMP mild hyperglycemia Preg neg  Will need MRI given sx, touch base with neuro, will treat with migraine cocktail meantime.  Nursing has informed me that patient states that she is leaving the ED.  She does not feel she needs the MRI.   I reassessed patient at bedside.  I had long, thorough discussion with patient of risk versus benefit of leaving prior to completed work up including missed CVA, dissection, mass, infection, MS or other acute intracranial abnormality causing her symptoms. I offered Ativan if she is concerned about the MRI and claustrophobia however she adamant that she is leaving the ED without further workup.  She voiced understanding of risk versus benefit and has chosen to leave the emergency department prior to work-up completion.  We discussed the nature and purpose, risks and benefits, as well as, the alternatives of treatment. Time was given  to allow the opportunity to ask questions and consider their options, and after the discussion, the patient decided to refuse the offerred treatment. The patient was informed that refusal could lead to, but was not limited to, death, permanent disability, or severe pain. If present, I asked the relatives or significant others to dissuade them without success. Prior to refusing, I determined that the patient had the capacity to make their decision and understood the consequences of that decision. After refusal, I made every reasonable opportunity to treat them to the best of my ability.  The patient was notified that they may return to the emergency department at any time for further treatment.   Patient has LEFT AGAINST MEDICAL ADVISE.    MDM Rules/Calculators/A&P  Final Clinical Impression(s) / ED Diagnoses Final diagnoses:  Acute nonintractable headache, unspecified headache type  Numbness    Rx / DC Orders ED Discharge Orders     None        Aja Bolander A, PA-C 04/21/21 0957    Horton, Clabe Seal, DO 04/21/21 1057

## 2021-04-21 NOTE — ED Provider Notes (Signed)
MSE was initiated and I personally evaluated the patient and placed orders (if any) at  12:34 AM on April 21, 2021.  Symptoms since yesterday of left sided headache (h/o migraines but different), left blurred vision, left mouth weakness, decreased sensation in left UE/LE, feels a little off balance but no falls. 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 No other medical problems. Reports mild cough, congestion w/o fever.   Today's Vitals   04/21/21 0034  BP: (!) 145/85  Pulse: 70  Resp: 17  Temp: 98.3 F (36.8 C)  TempSrc: Oral  SpO2: 100%   There is no height or weight on file to calculate BMI.  No appreciated facial asymmetry No strength deficits Subjective decreased left sided sensation to face, UE, LE Normal, symmetric reflexes  The patient appears stable so that the remainder of the MSE may be completed by another provider.   Elpidio Anis, PA-C 04/21/21 0037    Marily Memos, MD 04/21/21 [phone]

## 2021-04-21 NOTE — ED Notes (Signed)
Pt has blurry vision in left eye, droop on left side of face with smile. Pt reports having a different sensation on her left hand and left when compared to her right side. Pt reports numbness on the left side of her face beside her nose and left sided headache. Pt is alert and oriented x 4

## 2021-04-21 NOTE — ED Provider Notes (Addendum)
[facility] EMERGENCY DEPT Provider Note   CSN: [id number] Arrival date & time: 04/21/21  1603     History Chief Complaint  Patient presents with   Transient Ischemic Attack    Melissa Blankenship is a 40 y.o. female.  Patient seen early this morning at [facility].  Patient presenting with onset of a left forehead left temple area headache on Tuesday evening.  Yesterday developed some weakness to the left side of her face some left blurred vision and some numbness in the left upper extremity and left lower extremity.  Now just has slight numbness in the left upper extremity.  Still seems to have some left facial weakness that does not involve the forehead.  And still has some blurred vision.  Patient has a history of migraines which usually tend to be bilateral forehead and are associated with some visual changes.  So this is somewhat different.  Patient seen last night she had CT head without any acute findings sinuses were fine she had labs including pregnancy test and they had ordered MRI but patient did not want MRI so she left AMA.  Brought in here today still not keen about MRI does not want to be transferred back to [facility] to get an MRI.  States she is willing to follow-up with her primary care doctor.      Past Medical History:  Diagnosis Date   Anxiety    Depression    GERD (gastroesophageal reflux disease)    Thyroid nodule    Viral upper respiratory tract infection 07/02/2020    Patient Active Problem List   Diagnosis Date Noted   Tobacco use disorder 02/10/2021   Health care maintenance 06/01/2020   Bunion 05/14/2020   GAD (generalized anxiety disorder) with panic attacks 01/09/2019   Asthma 01/09/2019    Past Surgical History:  Procedure Laterality Date   ABDOMINAL HYSTERECTOMY     BUNIONECTOMY Left 06/22/2020   INCISION AND DRAINAGE / EXCISION THYROGLOSSAL CYST  2019   path report revealed hyperplastic thyroid tissue, non-malignant    TUBAL  LIGATION       OB History   No obstetric history on file.     Family History  Problem Relation Age of Onset   Lung cancer Mother    Diabetes Mellitus II Mother    Hypertension Mother    Diabetes type II Father    Hypertension Father    CAD Maternal Grandfather     Social History   Tobacco Use   Smoking status: Some Days    Packs/day: 0.10    Types: Cigarettes   Smokeless tobacco: Never   Tobacco comments:    1 pk per month   Vaping Use   Vaping Use: Never used  Substance Use Topics   Alcohol use: Yes    Comment: Social   Drug use: No    Home Medications Prior to Admission medications   Medication Sig Start Date End Date Taking? Authorizing Provider  albuterol (VENTOLIN HFA) 108 (90 Base) MCG/ACT inhaler Inhale 2 puffs into the lungs every 6 (six) hours as needed for wheezing or shortness of breath. 01/09/19   Elige Radon, MD  ALPRAZolam Prudy Feeler) 0.25 MG tablet Take 1-2 tablets (0.25-0.5 mg total) by mouth 3 (three) times daily as needed (Panic attacks). 12/24/20   Verdene Lennert, MD  escitalopram (LEXAPRO) 20 MG tablet Take 1 tablet (20 mg total) by mouth daily. Patient taking differently: Take 20 mg by mouth daily as needed (anxiety). 12/24/20  Jose Persia, MD  ibuprofen (ADVIL) 800 MG tablet Take 800 mg by mouth every 8 (eight) hours as needed for headache or mild pain.    [provider]  meloxicam (MOBIC) 15 MG tablet TAKE 1 TABLET (15 MG TOTAL) BY MOUTH DAILY. Patient not taking: No sig reported 07/22/20 07/22/21  Wallene Huh, DPM  predniSONE (STERAPRED UNI-PAK 21 TAB) 10 MG (21) TBPK tablet Take as directed on patient package Patient taking differently: Take 10 mg by mouth daily. 03/17/21   Landis Martins, DPM    Allergies    Patient has no known allergies.  Review of Systems   Review of Systems  Constitutional:  Negative for chills and fever.  HENT:  Negative for ear pain, sore throat and trouble swallowing.   Eyes:  Negative for pain  and visual disturbance.  Respiratory:  Negative for cough and shortness of breath.   Cardiovascular:  Negative for chest pain and palpitations.  Gastrointestinal:  Negative for abdominal pain and vomiting.  Genitourinary:  Negative for dysuria and hematuria.  Musculoskeletal:  Negative for arthralgias and back pain.  Skin:  Negative for color change and rash.  Neurological:  Positive for facial asymmetry, numbness and headaches. Negative for seizures, syncope and speech difficulty.  All other systems reviewed and are negative.  Physical Exam Updated Vital Signs BP 119/84   Pulse 64   Temp 98.7 F (37.1 C) (Oral)   Resp 20   LMP 04/29/2011   SpO2 100%   Physical Exam Vitals and nursing note reviewed.  Constitutional:      General: She is not in acute distress.    Appearance: Normal appearance. She is well-developed.  HENT:     Head: Normocephalic and atraumatic.     Mouth/Throat:     Mouth: Mucous membranes are moist.  Eyes:     Extraocular Movements: Extraocular movements intact.     Conjunctiva/sclera: Conjunctivae normal.     Pupils: Pupils are equal, round, and reactive to light.  Cardiovascular:     Rate and Rhythm: Normal rate and regular rhythm.     Heart sounds: No murmur heard. Pulmonary:     Effort: Pulmonary effort is normal. No respiratory distress.     Breath sounds: Normal breath sounds.  Abdominal:     Palpations: Abdomen is soft.     Tenderness: There is no abdominal tenderness.  Musculoskeletal:        General: Normal range of motion.     Cervical back: Normal range of motion and neck supple. No rigidity.  Skin:    General: Skin is warm and dry.  Neurological:     Mental Status: She is alert and oriented to person, place, and time.     Cranial Nerves: Cranial nerve deficit present.     Sensory: Sensory deficit present.     Motor: No weakness.     Coordination: Coordination normal.     Comments: Left sided facial droop very slight.  Forehead spared.   So good movement of the forehead.  Seems to be some slight numbness to the left upper extremity.  Good strength in upper extremities lower extremities and good coordination.  Good finger-to-nose and good heel-to-shin.    ED Results / Procedures / Treatments   Labs (all labs ordered are listed, but only abnormal results are displayed) Labs Reviewed  SEDIMENTATION RATE  C-REACTIVE PROTEIN    EKG EKG Interpretation  Date/Time:  Thursday April 21 2021 16:16:42 EST Ventricular Rate:  65 PR Interval:  121 QRS Duration: 96 QT Interval:  385 QTC Calculation: 401 R Axis:   24 Text Interpretation: Sinus rhythm Atrial premature complex Borderline T abnormalities, anterior leads Confirmed by Fredia Sorrow [phone]) on 04/21/2021 4:31:57 PM  Radiology CT HEAD WO CONTRAST (5MM)  Result Date: 04/21/2021 CLINICAL DATA:  Headache, new or worsening. EXAM: CT HEAD WITHOUT CONTRAST TECHNIQUE: Contiguous axial images were obtained from the base of the skull through the vertex without intravenous contrast. COMPARISON:  No prior imaging is available for review. FINDINGS: Brain: No acute intracranial hemorrhage, midline shift or mass effect. No extra-axial fluid collection. Gray-white matter differentiation is within normal limits and there is no hydrocephalus. Vascular: No hyperdense vessel or unexpected calcification. Skull: Normal. Negative for fracture or focal lesion. Sinuses/Orbits: No acute finding. Other: None. IMPRESSION: No acute intracranial process. Electronically Signed   By: Brett Fairy M.D.   On: 04/21/2021 01:16    Procedures Procedures   Medications Ordered in ED Medications  sodium chloride 0.9 % bolus 1,000 mL (1,000 mLs Intravenous New Bag/Given 04/21/21 1834)  diphenhydrAMINE (BENADRYL) injection 25 mg (25 mg Intravenous Given 04/21/21 1835)  metoCLOPramide (REGLAN) injection 10 mg (10 mg Intravenous Given 04/21/21 1841)  dexamethasone (DECADRON) injection 10 mg (10 mg  Intravenous Given 04/21/21 1843)    ED Course  I have reviewed the triage vital signs and the nursing notes.  Pertinent labs & imaging results that were available during my care of the patient were reviewed by me and considered in my medical decision making (see chart for details).    MDM Rules/Calculators/A&P                           They apparently attempted there began a migraine cocktail last night did not include Decadron.  Patient states that IV infiltrated there is not sure how much she received.  It was Compazine a low dose of Benadryl that she got.  Based on that we will try migraine cocktail again we will go ahead and do Reglan 25 mg of Benadryl and 10 mg of Decadron.  And give a liter of fluid.  Patient states that headache is improving significantly with that.  Still feels a little bit of the blurred vision in the left eye but seems to be improving.  Still feels just a tiny bit of numbness to the left upper extremity.  Face may be with some minimal droop.  Once again asked if she wanted to be transferred into [facility] for MRI which would be important in her work-up.  She does not want to do that.  Did offer to give her Ativan whether they could give her Ativan for the MRI.  She states she will follow-up with her primary care doctor.  It is possible this could be a complicated migraine.  Is possible could be new onset of MS.  And is also possible there could be a CVA she is in her 88s.  And is possible that there could be temporal arteritis or giant cell arteritis.  For this I did orders sedimentation rate and C-reactive protein.  They will not be back today but they will be available for the primary care doctor to follow-up and for the patient to follow-up on MyChart.  I think this is unlikely because it would not explain the numbness to the left upper extremity and/or now the resolved numbness to the left lower extremity.  Past medical history significant for some tobacco use.  But not a  diabetic not known to be hypertensive.  Would prefer patient get MRI but she is not willing to do that.  Will discharge and have her follow-up with her primary care provider.   Final Clinical Impression(s) / ED Diagnoses Final diagnoses:  Complicated migraine    Rx / DC Orders ED Discharge Orders     None        Fredia Sorrow, MD 04/21/21 2010    Fredia Sorrow, MD 04/21/21 2015

## 2021-04-21 NOTE — ED Triage Notes (Signed)
Patient reports to the ER for facial drooping. Patient reports she left from [facility] last night against medical advice due to the long wait and she states she was anxious about the MRI. Patient reports the left side of her face feels weak and stuck. Patient reports she started having symptoms Tuesday night and started having worse symptoms yesterday while she was working. Patient reports blurred vision and numbness on the left side.

## 2021-04-21 NOTE — Discharge Instructions (Addendum)
Follow-up with your primary care doctor to schedule an outpatient MRI.  Also referral provided to neurology to make an appointment for follow-up.  Return for any new or worse symptoms at all.  This certainly will warrant MRI.  Would recommend returning to facility that has the MRI like [facility].  Go home and rest in a dark environment allow the Decadron to work to see if it will abate the headache completely.  In addition you have the sedimentation rate and the C-reactive protein labs there to follow-up on MyChart and with your primary care doctor.  These were done for evaluation of possible temporal arteritis.

## 2021-04-22 ENCOUNTER — Encounter: Payer: Self-pay | Admitting: Neurology

## 2021-04-22 ENCOUNTER — Encounter: Payer: Self-pay | Admitting: Internal Medicine

## 2021-04-23 ENCOUNTER — Encounter (HOSPITAL_COMMUNITY): Payer: Self-pay

## 2021-04-23 ENCOUNTER — Emergency Department (HOSPITAL_COMMUNITY)
Admission: EM | Admit: 2021-04-23 | Discharge: 2021-04-23 | Disposition: A | Discharge status: Home / Self Care | Payer: No Typology Code available for payment source | Attending: Emergency Medicine | Admitting: Emergency Medicine

## 2021-04-23 ENCOUNTER — Emergency Department (HOSPITAL_COMMUNITY): Payer: No Typology Code available for payment source

## 2021-04-23 DIAGNOSIS — H538 Other visual disturbances: Secondary | ICD-10-CM | POA: Insufficient documentation

## 2021-04-23 DIAGNOSIS — R200 Anesthesia of skin: Secondary | ICD-10-CM | POA: Insufficient documentation

## 2021-04-23 DIAGNOSIS — R519 Headache, unspecified: Secondary | ICD-10-CM | POA: Insufficient documentation

## 2021-04-23 DIAGNOSIS — F17210 Nicotine dependence, cigarettes, uncomplicated: Secondary | ICD-10-CM | POA: Insufficient documentation

## 2021-04-23 DIAGNOSIS — Z79899 Other long term (current) drug therapy: Secondary | ICD-10-CM | POA: Diagnosis not present

## 2021-04-23 DIAGNOSIS — R531 Weakness: Secondary | ICD-10-CM | POA: Insufficient documentation

## 2021-04-23 LAB — CBC WITH DIFFERENTIAL/PLATELET
Abs Immature Granulocytes: 0.02 10*3/uL (ref 0.00–0.07)
Basophils Absolute: 0 10*3/uL (ref 0.0–0.1)
Basophils Relative: 0 %
Eosinophils Absolute: 0.1 10*3/uL (ref 0.0–0.5)
Eosinophils Relative: 1 %
HCT: 37.2 % (ref 36.0–46.0)
Hemoglobin: 12.4 g/dL (ref 12.0–15.0)
Immature Granulocytes: 0 %
Lymphocytes Relative: 50 %
Lymphs Abs: 4.9 10*3/uL — ABNORMAL HIGH (ref 0.7–4.0)
MCH: 30 pg (ref 26.0–34.0)
MCHC: 33.3 g/dL (ref 30.0–36.0)
MCV: 89.9 fL (ref 80.0–100.0)
Monocytes Absolute: 0.6 10*3/uL (ref 0.1–1.0)
Monocytes Relative: 6 %
Neutro Abs: 4.2 10*3/uL (ref 1.7–7.7)
Neutrophils Relative %: 43 %
Platelets: 194 10*3/uL (ref 150–400)
RBC: 4.14 MIL/uL (ref 3.87–5.11)
RDW: 13.3 % (ref 11.5–15.5)
WBC: 9.9 10*3/uL (ref 4.0–10.5)
nRBC: 0 % (ref 0.0–0.2)

## 2021-04-23 LAB — COMPREHENSIVE METABOLIC PANEL
ALT: 11 U/L (ref 0–44)
AST: 13 U/L — ABNORMAL LOW (ref 15–41)
Albumin: 3.5 g/dL (ref 3.5–5.0)
Alkaline Phosphatase: 54 U/L (ref 38–126)
Anion gap: 4 — ABNORMAL LOW (ref 5–15)
BUN: 7 mg/dL (ref 6–20)
CO2: 26 mmol/L (ref 22–32)
Calcium: 8.3 mg/dL — ABNORMAL LOW (ref 8.9–10.3)
Chloride: 108 mmol/L (ref 98–111)
Creatinine, Ser: 0.76 mg/dL (ref 0.44–1.00)
GFR, Estimated: >60 mL/min (ref >60)
Glucose, Bld: 90 mg/dL (ref 70–99)
Potassium: 3 mmol/L — ABNORMAL LOW (ref 3.5–5.1)
Sodium: 138 mmol/L (ref 135–145)
Total Bilirubin: 0.5 mg/dL (ref 0.3–1.2)
Total Protein: 6.1 g/dL — ABNORMAL LOW (ref 6.5–8.1)

## 2021-04-23 LAB — TROPONIN I (HIGH SENSITIVITY)
Troponin I (High Sensitivity): 4 ng/L (ref <18)
Troponin I (High Sensitivity): 4 ng/L (ref <18)

## 2021-04-23 LAB — TSH: TSH: 0.666 u[IU]/mL (ref 0.350–4.500)

## 2021-04-23 IMAGING — MR MR HEAD WO/W CM
13 series · 48 of 48 positions shown · IV contrast (gadavist)
Comparison: CT head [DATE].

CLINICAL DATA: Transient ischemic attack (TIA) left facial droop,
r/o TIA, Stroke, MS

EXAM:
MRI HEAD WITHOUT AND WITH CONTRAST
TECHNIQUE: Multiplanar, multiecho pulse sequences of the brain and surrounding
structures were obtained without and with intravenous contrast.
CONTRAST:  5.5mL GADAVIST GADOBUTROL 1 MMOL/ML IV SOLN

[Series 5: DWI · axial · 3.0mm · 1.36mm/px · z∈[-121,+18]mm · 7 of 96 slices shown (1 of 2)]
[im 1/96]
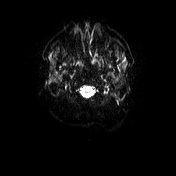
[im 16/96]
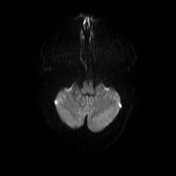
[im 32/96]
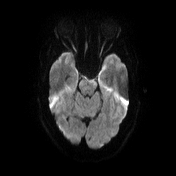
[im 48/96]
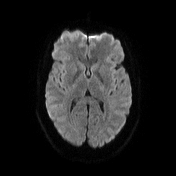
[im 64/96]
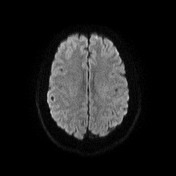
[im 80/96]
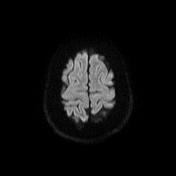
[im 96/96]
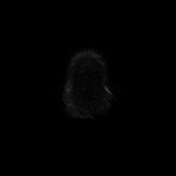

[Series 6: DWI · axial · 3.0mm · 1.36mm/px · z∈[-121,+18]mm · 3 of 48 slices shown (2 of 2)]
[im 1/48]
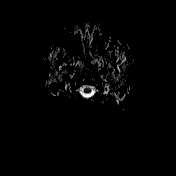
[im 24/48]
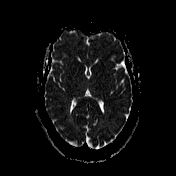
[im 48/48]
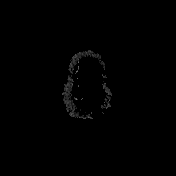

[Series 7: T1 · sagittal · 5.0mm · 0.75mm/px · 1 of 22 slices shown (1 of 2)]
[im 1/22]
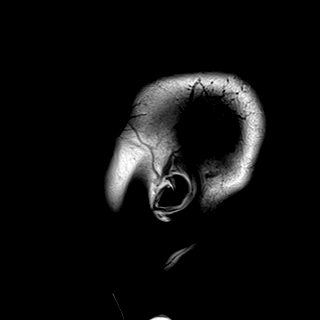

[Series 8: T2 · axial · 5.0mm · 0.62mm/px · 1 of 24 slices shown]
[im 1/24]
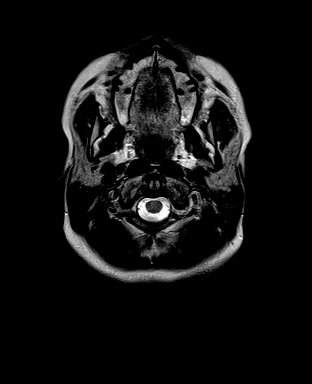

[Series 9: swi_images · axial · 3.0mm · 0.75mm/px · z∈[-155,+55]mm · 4 of 72 slices shown]
[im 1/72]
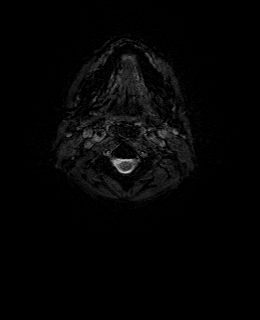
[im 24/72]
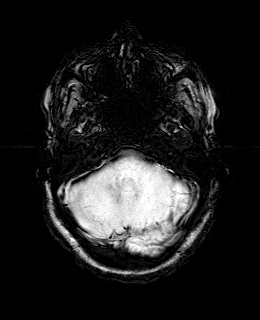
[im 48/72]
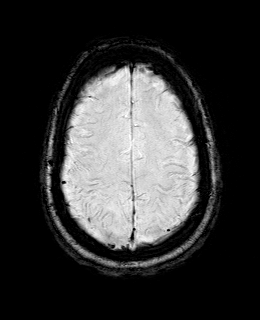
[im 72/72]
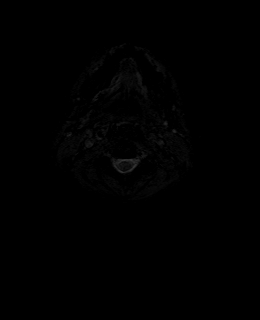

[Series 11: FLAIR · axial · 3.0mm · 0.75mm/px · z∈[-125,+26]mm · 3 of 52 slices shown]
[im 1/52]
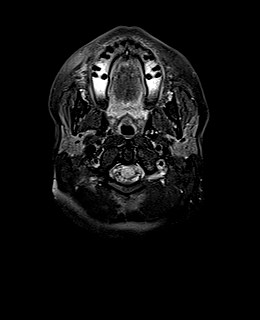
[im 26/52]
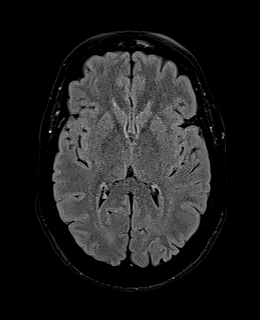
[im 52/52]
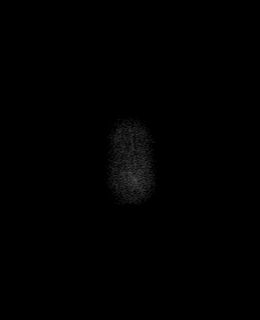

[Series 12: T1 · axial · 1.0mm · 0.94mm/px · z∈[-120,+21]mm · 9 of 144 slices shown (2 of 2)]
[im 1/144]
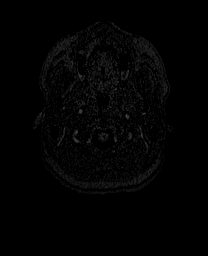
[im 18/144]
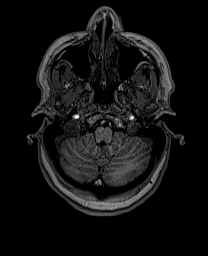
[im 36/144]
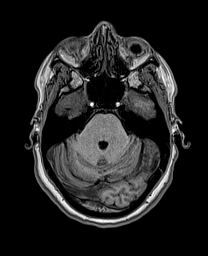
[im 54/144]
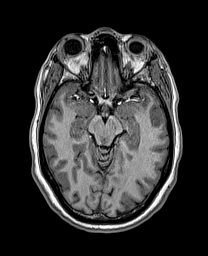
[im 72/144]
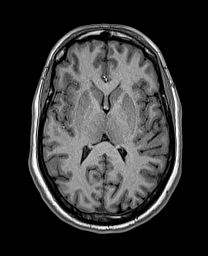
[im 90/144]
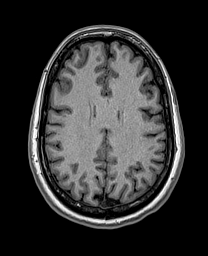
[im 108/144]
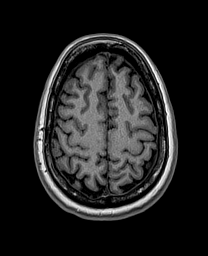
[im 126/144]
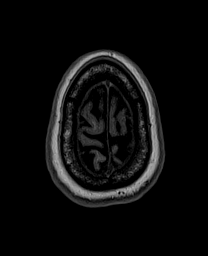
[im 144/144]
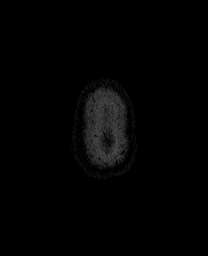

[Series 13: cor dwi_tracew · coronal · 5.0mm · 1.53mm/px · 4 of 60 slices shown]
[im 1/60]
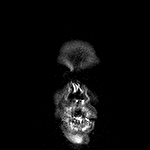
[im 20/60]
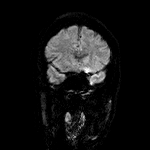
[im 40/60]
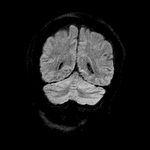
[im 60/60]
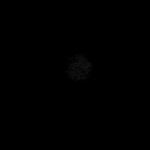

[Series 14: cor dwi_adc · coronal · 5.0mm · 1.53mm/px · 2 of 29 slices shown]
[im 1/29]
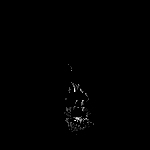
[im 29/29]
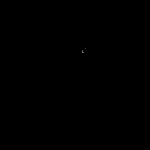

[Series 15: T2 post-contrast · coronal · 5.0mm · 0.57mm/px · 2 of 30 slices shown]
[im 1/30]
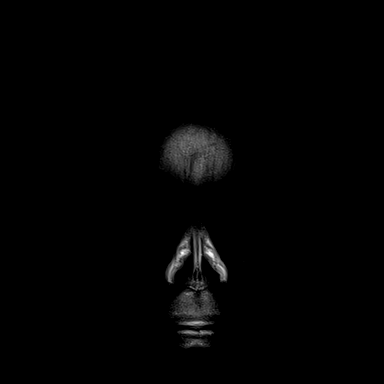
[im 30/30]
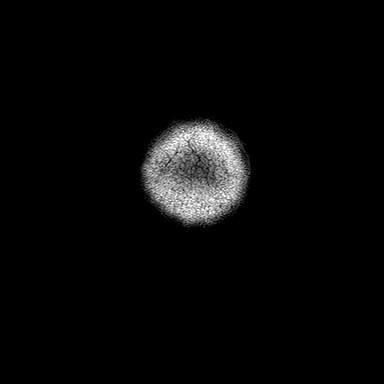

[Series 16: T1 post-contrast · axial · 1.0mm · 0.94mm/px · z∈[-120,+21]mm · 9 of 144 slices shown (1 of 3)]
[im 1/144]
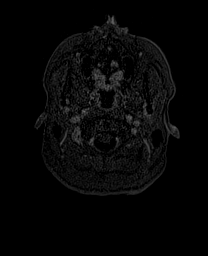
[im 18/144]
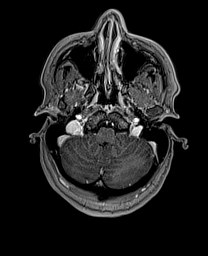
[im 36/144]
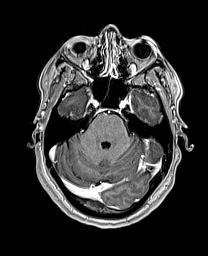
[im 54/144]
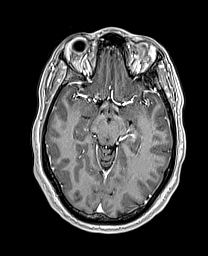
[im 72/144]
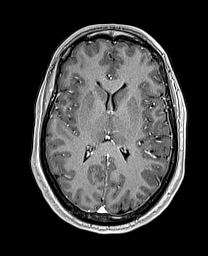
[im 90/144]
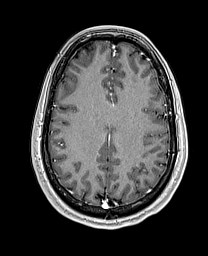
[im 108/144]
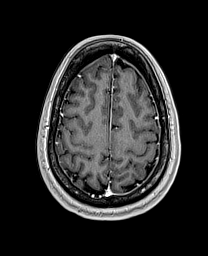
[im 126/144]
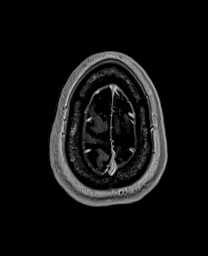
[im 144/144]
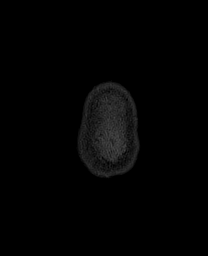

[Series 17: T1 post-contrast · coronal · 5.0mm · 0.43mm/px · 2 of 30 slices shown (2 of 3)]
[im 1/30]
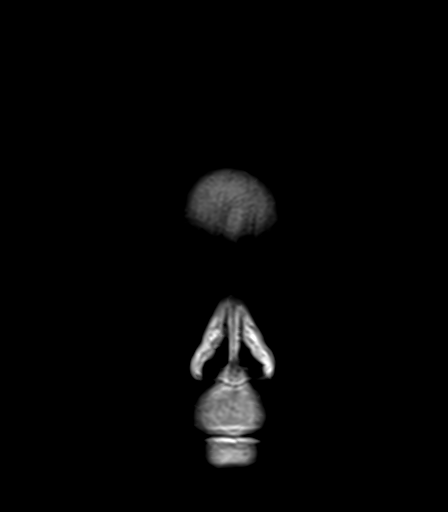
[im 30/30]
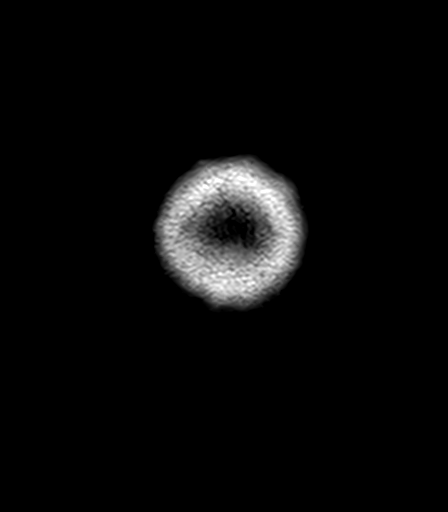

[Series 18: T1 post-contrast · sagittal · 5.0mm · 0.75mm/px · 1 of 22 slices shown (3 of 3)]
[im 1/22]
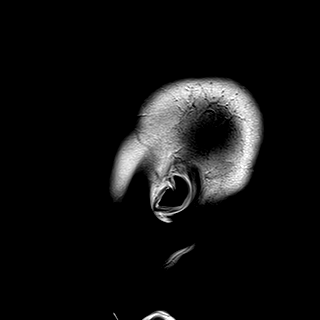

[48 of 48 positions shown; findings below may reference images not displayed]

FINDINGS: Mildly motion limited study

Brain: No acute infarction, hemorrhage, hydrocephalus, extra-axial
collection or mass lesion. No abnormal enhancement.

Vascular: Major arterial flow voids are maintained skull base.

Skull and upper cervical spine: Normal marrow signal.

Sinuses/Orbits: Clear sinuses.  Unremarkable orbits.

Other: No mastoid effusions.
IMPRESSION: Normal brain MRI.  No acute abnormality.

## 2021-04-23 MED ORDER — PROCHLORPERAZINE EDISYLATE 10 MG/2ML IJ SOLN
10.0000 mg | Freq: Once | INTRAMUSCULAR | Status: AC
  Administered 2021-04-23: 10 mg via INTRAVENOUS
  Filled 2021-04-23: qty 2

## 2021-04-23 MED ORDER — SODIUM CHLORIDE 0.9 % IV BOLUS
1000.0000 mL | Freq: Once | INTRAVENOUS | Status: AC
  Administered 2021-04-23: 1000 mL via INTRAVENOUS

## 2021-04-23 MED ORDER — LORAZEPAM 1 MG PO TABS
1.0000 mg | ORAL_TABLET | Freq: Once | ORAL | Status: AC
  Administered 2021-04-23: 1 mg via ORAL
  Filled 2021-04-23: qty 1

## 2021-04-23 MED ORDER — GADOBUTROL 1 MMOL/ML IV SOLN
5.5000 mL | Freq: Once | INTRAVENOUS | Status: AC | PRN
  Administered 2021-04-23: 5.5 mL via INTRAVENOUS

## 2021-04-23 MED ORDER — PROCHLORPERAZINE MALEATE 10 MG PO TABS: 10.0000 mg | ORAL_TABLET | Freq: Three times a day (TID) | ORAL | 0 refills | Status: DC | PRN

## 2021-04-23 MED ORDER — DIPHENHYDRAMINE HCL 50 MG/ML IJ SOLN
50.0000 mg | Freq: Once | INTRAMUSCULAR | Status: AC
  Administered 2021-04-23: 50 mg via INTRAVENOUS
  Filled 2021-04-23: qty 1

## 2021-04-23 NOTE — ED Provider Notes (Signed)
[facility] DEPT Provider Note   CSN: [id number] Arrival date & time: 04/23/21  1007     History Chief Complaint  Patient presents with   Headache    Melissa Blankenship is a 40 y.o. female.  The history is provided by the patient and medical records. No language interpreter was used.  Headache Pain location:  L temporal Quality:  Sharp and dull Radiates to:  Face Severity currently:  9/10 Severity at highest:  9/10 Onset quality:  Gradual Duration:  5 days Timing:  Constant Progression:  Waxing and waning Chronicity:  New Context: bright light   Relieved by:  Nothing Worsened by:  Nothing Ineffective treatments:  None tried Associated symptoms: blurred vision, focal weakness (left face and left arm reportedly), nausea, numbness, paresthesias, photophobia and visual change   Associated symptoms: no abdominal pain, no back pain, no congestion, no cough (resolved), no diarrhea, no fatigue, no fever, no hearing loss, no loss of balance, no neck pain, no neck stiffness, no seizures, no sore throat, no vomiting and no weakness       Past Medical History:  Diagnosis Date   Anxiety    Depression    GERD (gastroesophageal reflux disease)    Thyroid nodule    Viral upper respiratory tract infection 07/02/2020    Patient Active Problem List   Diagnosis Date Noted   Tobacco use disorder 02/10/2021   Health care maintenance 06/01/2020   Bunion 05/14/2020   GAD (generalized anxiety disorder) with panic attacks 01/09/2019   Asthma 01/09/2019    Past Surgical History:  Procedure Laterality Date   ABDOMINAL HYSTERECTOMY     BUNIONECTOMY Left 06/22/2020   INCISION AND DRAINAGE / EXCISION THYROGLOSSAL CYST  2019   path report revealed hyperplastic thyroid tissue, non-malignant    TUBAL LIGATION       OB History   No obstetric history on file.     Family History  Problem Relation Age of Onset   Lung cancer Mother    Diabetes Mellitus II  Mother    Hypertension Mother    Diabetes type II Father    Hypertension Father    CAD Maternal Grandfather     Social History   Tobacco Use   Smoking status: Some Days    Packs/day: 0.10    Types: Cigarettes   Smokeless tobacco: Never   Tobacco comments:    1 pk per month   Vaping Use   Vaping Use: Never used  Substance Use Topics   Alcohol use: Yes    Comment: Social   Drug use: No    Home Medications Prior to Admission medications   Medication Sig Start Date End Date Taking? Authorizing Provider  albuterol (VENTOLIN HFA) 108 (90 Base) MCG/ACT inhaler Inhale 2 puffs into the lungs every 6 (six) hours as needed for wheezing or shortness of breath. 01/09/19   Mitzi Hansen, MD  ALPRAZolam Duanne Moron) 0.25 MG tablet Take 1-2 tablets (0.25-0.5 mg total) by mouth 3 (three) times daily as needed (Panic attacks). 12/24/20   Jose Persia, MD  escitalopram (LEXAPRO) 20 MG tablet Take 1 tablet (20 mg total) by mouth daily. Patient taking differently: Take 20 mg by mouth daily as needed (anxiety). 12/24/20   Jose Persia, MD  ibuprofen (ADVIL) 800 MG tablet Take 800 mg by mouth every 8 (eight) hours as needed for headache or mild pain.    [provider]  meloxicam (MOBIC) 15 MG tablet TAKE 1 TABLET (15 MG TOTAL)  BY MOUTH DAILY. Patient not taking: No sig reported 07/22/20 07/22/21  Wallene Huh, DPM  predniSONE (STERAPRED UNI-PAK 21 TAB) 10 MG (21) TBPK tablet Take as directed on patient package Patient taking differently: Take 10 mg by mouth daily. 03/17/21   Landis Martins, DPM    Allergies    Patient has no known allergies.  Review of Systems   Review of Systems  Constitutional:  Negative for chills, diaphoresis, fatigue and fever.  HENT:  Negative for congestion, hearing loss and sore throat.   Eyes:  Positive for blurred vision, photophobia and visual disturbance.  Respiratory:  Negative for cough (resolved), chest tightness, shortness of breath and wheezing.    Cardiovascular:  Positive for chest pain. Negative for palpitations and leg swelling.  Gastrointestinal:  Positive for nausea. Negative for abdominal distention, abdominal pain, diarrhea and vomiting.  Genitourinary:  Negative for dysuria.  Musculoskeletal:  Negative for back pain, neck pain and neck stiffness.  Skin:  Negative for rash and wound.  Neurological:  Positive for focal weakness (left face and left arm reportedly), numbness, headaches and paresthesias. Negative for seizures, weakness and loss of balance.  Psychiatric/Behavioral:  Negative for agitation and confusion.   All other systems reviewed and are negative.  Physical Exam Updated Vital Signs BP 120/83 (BP Location: Right Arm)   Pulse 68   Temp 98.1 F (36.7 C) (Oral)   Resp 18   LMP 04/29/2011   SpO2 99%   Physical Exam Vitals and nursing note reviewed.  Constitutional:      General: She is not in acute distress.    Appearance: She is well-developed. She is not ill-appearing, toxic-appearing or diaphoretic.  HENT:     Head: Normocephalic and atraumatic.  Eyes:     General: No visual field deficit.    Extraocular Movements:     Right eye: Normal extraocular motion.     Left eye: Normal extraocular motion.     Conjunctiva/sclera: Conjunctivae normal.  Cardiovascular:     Rate and Rhythm: Normal rate and regular rhythm.     Heart sounds: No murmur heard. Pulmonary:     Effort: Pulmonary effort is normal. No respiratory distress.     Breath sounds: Normal breath sounds. No wheezing, rhonchi or rales.  Chest:     Chest wall: Tenderness present.  Abdominal:     Palpations: Abdomen is soft.     Tenderness: There is no abdominal tenderness.  Musculoskeletal:        General: No swelling or tenderness.     Cervical back: Neck supple. No rigidity.  Skin:    General: Skin is warm and dry.  Neurological:     Mental Status: She is alert.     GCS: GCS eye subscore is 4. GCS verbal subscore is 5. GCS motor  subscore is 6.     Cranial Nerves: Facial asymmetry present.     Sensory: Sensory deficit present.     Motor: Weakness present.     Coordination: Coordination normal.  Psychiatric:        Mood and Affect: Mood is anxious.        Speech: Speech normal.    ED Results / Procedures / Treatments   Labs (all labs ordered are listed, but only abnormal results are displayed) Labs Reviewed  CBC WITH DIFFERENTIAL/PLATELET - Abnormal; Notable for the following components:      Result Value   Lymphs Abs 4.9 (*)    All other components within normal limits  COMPREHENSIVE METABOLIC PANEL - Abnormal; Notable for the following components:   Potassium 3.0 (*)    Calcium 8.3 (*)    Total Protein 6.1 (*)    AST 13 (*)    Anion gap 4 (*)    All other components within normal limits  TSH  TROPONIN I (HIGH SENSITIVITY)  TROPONIN I (HIGH SENSITIVITY)    EKG EKG Interpretation  Date/Time:  Saturday April 23 2021 15:59:02 EST Ventricular Rate:  63 PR Interval:  135 QRS Duration: 96 QT Interval:  415 QTC Calculation: 425 R Axis:   29 Text Interpretation: Sinus rhythm Low voltage, precordial leads When compared to prior, similar appearance. No sTEMI Confirmed by Antony Blackbird [phone]) on 04/23/2021 4:13:02 PM  Radiology MR Brain W and Wo Contrast  Result Date: 04/23/2021 CLINICAL DATA:  Transient ischemic attack (TIA) left facial droop, r/o TIA, Stroke, MS EXAM: MRI HEAD WITHOUT AND WITH CONTRAST TECHNIQUE: Multiplanar, multiecho pulse sequences of the brain and surrounding structures were obtained without and with intravenous contrast. CONTRAST:  5.23m GADAVIST GADOBUTROL 1 MMOL/ML IV SOLN COMPARISON:  CT head April 21, 2021. FINDINGS: Mildly motion limited study Brain: No acute infarction, hemorrhage, hydrocephalus, extra-axial collection or mass lesion. No abnormal enhancement. Vascular: Major arterial flow voids are maintained skull base. Skull and upper cervical spine: Normal marrow  signal. Sinuses/Orbits: Clear sinuses.  Unremarkable orbits. Other: No mastoid effusions. IMPRESSION: Normal brain MRI.  No acute abnormality. Electronically Signed   By: FMargaretha SheffieldM.D.   On: 04/23/2021 16:27    Procedures Procedures   Medications Ordered in ED Medications  LORazepam (ATIVAN) tablet 1 mg (1 mg Oral Given 04/23/21 1347)  gadobutrol (GADAVIST) 1 MMOL/ML injection 5.5 mL (5.5 mLs Intravenous Contrast Given 04/23/21 1445)  sodium chloride 0.9 % bolus 1,000 mL (0 mLs Intravenous Stopped 04/23/21 1735)  prochlorperazine (COMPAZINE) injection 10 mg (10 mg Intravenous Given 04/23/21 1548)  diphenhydrAMINE (BENADRYL) injection 50 mg (50 mg Intravenous Given 04/23/21 1548)    ED Course  I have reviewed the triage vital signs and the nursing notes.  Pertinent labs & imaging results that were available during my care of the patient were reviewed by me and considered in my medical decision making (see chart for details).    MDM Rules/Calculators/A&P                           Melissa NEMBREE BRAWLEYis a 41y.o. female with a past medical history sniffing for asthma, thyroid nodule, and anxiety who presents for further evaluation and management of continued headache with left-sided facial numbness, left eye blurred vision, left side facial droop, left arm numbness/weakness and today has been having some new left-sided chest discomfort going down the left arm.  Patient reports that since Tuesday, she has been having the symptoms waxing waning.  She has been seen twice already including having a reassuring CT head and some labs that had normal ESR and CRP.  During previous visits, they are concerned about possible MS versus stroke causing the symptoms which have been persistent but waxing waning in severity for the last few days.  She does still report a severe headache in the left temple and left head.  She reports her left vision is blurred but denies any focal visual field  abnormalities.  She denies any vomiting reports a mild nausea.  She says that she has had headaches in the past but feel different than the headache she has  been having for the last few days.  She reports that her left arm feels weaker with grip strength and still feels numb like the lower part of her left face.  Her forehead is asymptomatic she reports.  Otherwise no current fevers, chills, shortness breath, palpitations, constipation, diarrhea, or urinary changes.  No lower extremity symptoms reported.  In triage, patient had MRI ordered which was requested during her most recent visit.  It was ordered with and without contrast to look for MS versus stroke.  On my exam, patient does indeed have a very subtle left lower facial droop and numbness in the left face from the eye downward.  She has normal strength and sensation in the upper forehead.  Otherwise she had slight decrease in grip strength on the left and numbness in the left arm compared to the right.  Legs had normal strength and sensation.  Good pulses in extremities.  Visual fields were intact but she reported some mild blurriness in the left eye compared to the right.  Normal extraocular movements otherwise.  Clear speech.  Chest was tender in the left chest going down the left arm.  Lungs were clear.  No murmur.  We will see what the MRI reveals and we will also add on chest x-ray, EKG, and labs for the chest discomfort.  We will give a headache cocktail to try and help her symptoms.  Given normal ESR and CRP several days ago, low suspicion for giant cell arteritis at this time  Anticipate touching base with neurology to discuss plan of care and management after work-up was completed.  On reassessment, her has started to improve.  She is feeling better.  Her MRI was completely reassuring and I spoke with neurology about as well.  Neurology suspects a complicated migraine but does feel that she would benefit from outpatient neurology  follow-up with a headache specialist as well as outpatient ophthalmology follow-up.  We did discuss the possibility of intracranial hypertension and lumbar puncture to assess versus treat we also discussed the possibility of subarachnoid needing lumbar puncture look presented currently however patient's not lumbar puncture at this time.  She is starting to feel better.    Patient does want to go home now and will be given prescription for Compazine and instructed to take Benadryl as well if needed.  Patient will rest and stay hydrated and have the follow-up.  She understands return precautions and follow-up instructions and was discharged in good condition after reassuring work-up.   Final Clinical Impression(s) / ED Diagnoses Final diagnoses:  Nonintractable headache, unspecified chronicity pattern, unspecified headache type    Rx / DC Orders ED Discharge Orders          Ordered    prochlorperazine (COMPAZINE) 10 MG tablet  Every 8 hours PRN        04/23/21 1919            Clinical Impression: 1. Nonintractable headache, unspecified chronicity pattern, unspecified headache type     Disposition: Discharge  Condition: Good  I have discussed the results, Dx and Tx plan with the pt(& family if present). He/she/they expressed understanding and agree(s) with the plan. Discharge instructions discussed at great length. Strict return precautions discussed and pt &/or family have verbalized understanding of the instructions. No further questions at time of discharge.    Discharge Medication List as of 04/23/2021  7:27 PM     START taking these medications   Details  prochlorperazine (COMPAZINE) 10 MG tablet  Take 1 tablet (10 mg total) by mouth every 8 (eight) hours as needed for nausea or vomiting (headaches as well)., Starting Sat 04/23/2021, Print        Follow Up: Melvenia Beam, [place] [id number] [phone]     [place], [facility] [place] [address] [phone]        Teshaun Olarte, Gwenyth Allegra, MD 04/23/21 1945

## 2021-04-23 NOTE — ED Notes (Addendum)
Pt in MRI.

## 2021-04-23 NOTE — ED Triage Notes (Signed)
PT c/o headache and numbness to the left side of her face x2 days.

## 2021-04-23 NOTE — ED Provider Notes (Signed)
Emergency Medicine Provider Triage Evaluation Note  Melissa Blankenship , a 40 y.o. female  was evaluated in triage.  Pt complains of left sided facial numbness and weakness along with LUE sensory change. Patient was seen here two days ago for the same issue, and mentions her PCP could not get her in for another few days. Reports it has gradually worsened. Normal gait. Denies any speech problems.   Review of Systems  Positive:  Negative:   Physical Exam  BP 132/83 (BP Location: Right Arm)   Pulse 77   Temp 98.1 F (36.7 C) (Oral)   Resp 16   LMP 04/29/2011   SpO2 100%  Gen:   Awake, no distress   Resp:  Normal effort  MSK:   Moves extremities without difficulty  Other:  No pronator drift, slight facial droop, spares forehead, speech normal, gait normal   Medical Decision Making  Medically screening exam initiated at 10:21 AM.  Appropriate orders placed.  Melissa Blankenship was informed that the remainder of the evaluation will be completed by another provider, this initial triage assessment does not replace that evaluation, and the importance of remaining in the ED until their evaluation is complete.  MRI ordered. Patient had labs two days ago with normal Cr of 0.67.   Achille Rich, PA-C 04/23/21 1024    Gwyneth Sprout, MD 04/25/21 [phone]

## 2021-04-23 NOTE — Discharge Instructions (Addendum)
Your history, exam, work-up today are suspicious for a complex migraine leading to both the headache, vision changes, as well as the numbness and weakness you have been experiencing.  Please follow-up with both outpatient neurology and outpatient ophthalmology for follow-up and use the nausea/headache medicine help with symptoms.  You may also take Benadryl if you start getting agitation type symptoms or restlessness.  Please rest and stay hydrated and follow-up with your primary doctor.  If any symptoms change or worsen acutely, please return to the nearest emergency department.

## 2021-04-25 ENCOUNTER — Other Ambulatory Visit (HOSPITAL_COMMUNITY): Payer: Self-pay

## 2021-04-25 ENCOUNTER — Telehealth: Payer: Self-pay

## 2021-04-25 ENCOUNTER — Emergency Department (HOSPITAL_BASED_OUTPATIENT_CLINIC_OR_DEPARTMENT_OTHER)
Admission: EM | Admit: 2021-04-25 | Discharge: 2021-04-25 | Disposition: A | Discharge status: Home / Self Care | Payer: No Typology Code available for payment source | Attending: Emergency Medicine | Admitting: Emergency Medicine

## 2021-04-25 ENCOUNTER — Other Ambulatory Visit: Payer: Self-pay

## 2021-04-25 ENCOUNTER — Encounter (HOSPITAL_BASED_OUTPATIENT_CLINIC_OR_DEPARTMENT_OTHER): Payer: Self-pay | Admitting: Emergency Medicine

## 2021-04-25 ENCOUNTER — Emergency Department (HOSPITAL_BASED_OUTPATIENT_CLINIC_OR_DEPARTMENT_OTHER): Payer: No Typology Code available for payment source

## 2021-04-25 DIAGNOSIS — F17210 Nicotine dependence, cigarettes, uncomplicated: Secondary | ICD-10-CM | POA: Insufficient documentation

## 2021-04-25 DIAGNOSIS — J45909 Unspecified asthma, uncomplicated: Secondary | ICD-10-CM | POA: Insufficient documentation

## 2021-04-25 DIAGNOSIS — R519 Headache, unspecified: Secondary | ICD-10-CM | POA: Insufficient documentation

## 2021-04-25 IMAGING — CT CT ANGIO NECK
2 of 12 series · 6 of 33 positions shown · IV contrast (omnipaque)
Comparison: Correlation made with recent CT and MR imaging

CLINICAL DATA: Vertebral artery dissection suspected; Dural venous
sinus thrombosis suspected

EXAM:
CT ANGIOGRAPHY HEAD AND NECK
CT VENOGRAM HEAD
TECHNIQUE: Multidetector CT imaging of the head and neck was performed using
the standard protocol during bolus administration of intravenous
contrast. Multiplanar CT image reconstructions and MIPs were
obtained to evaluate the vascular anatomy. Carotid stenosis
measurements (when applicable) are obtained utilizing NASCET
criteria, using the distal internal carotid diameter as the
denominator.
Venographic phase images of the brain were obtained following above.
Multiplanar reformats and maximum intensity projections were
generated.
CONTRAST:  75mL OMNIPAQUE IOHEXOL 350 MG/ML SOLN

[Series 7: cta head · axial · 0.47mm/px · z∈[-92,+117]mm · 4 of 700 slices shown]
[im 140/700  soft-tissue]
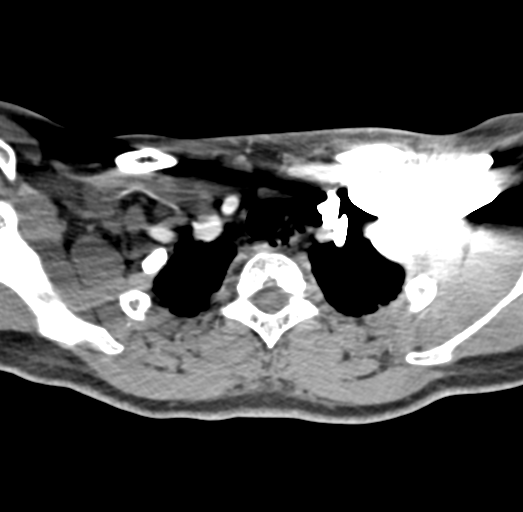
[im 280/700  bone]
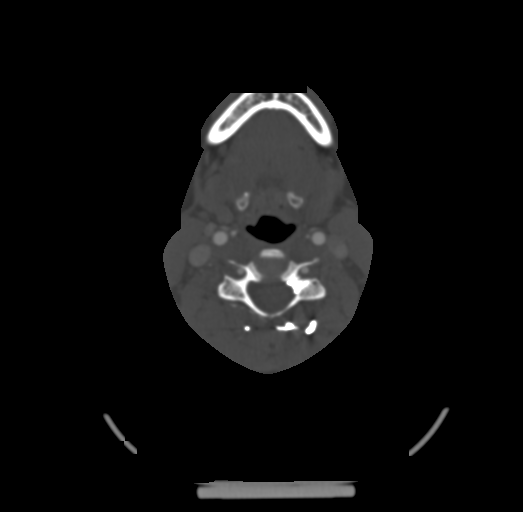
[im 420/700  soft-tissue]
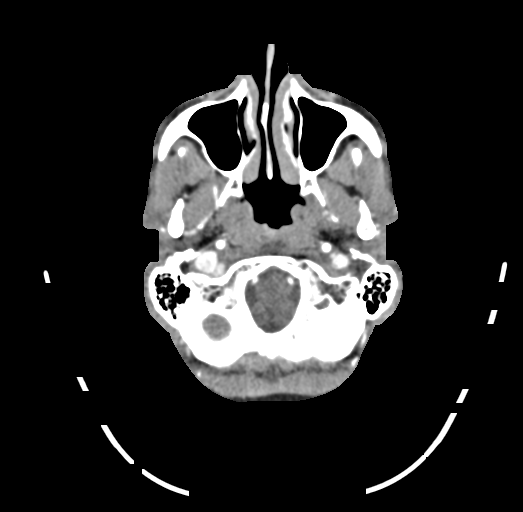
[im 560/700  bone]
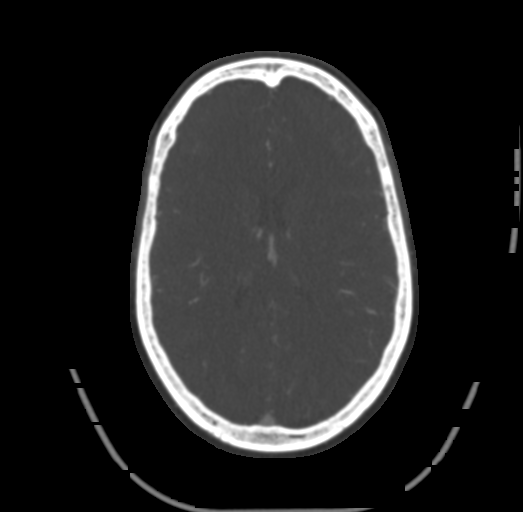

[Series 8: ax thin · axial · 0.47mm/px · z∈[-45,+71]mm · 2 of 350 slices shown]
[im 117/350  soft-tissue]
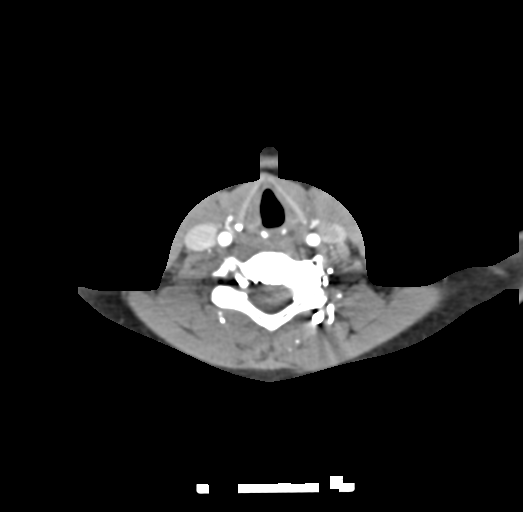
[im 233/350  soft-tissue]
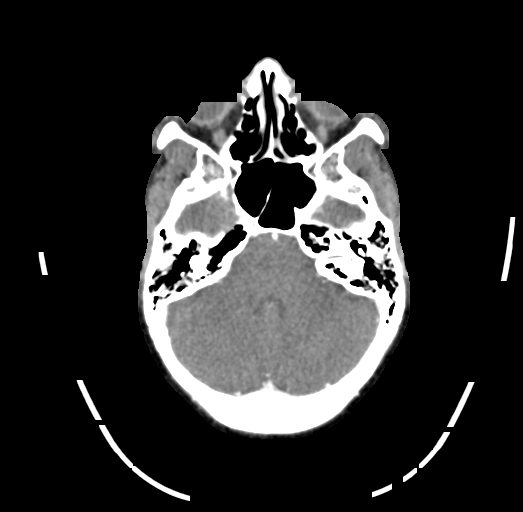

[6 of 33 positions shown; findings below may reference images not displayed]

FINDINGS: CT HEAD

Brain: There is no acute intracranial hemorrhage, mass effect, or
edema. Gray-white differentiation is preserved. There is no
extra-axial fluid collection. Ventricles and sulci are within normal
limits in size and configuration.

Vascular: No hyperdense vessel.

Skull: Calvarium is unremarkable.

Sinuses/Orbits: No acute finding.

Other: None.

Review of the MIP images confirms the above findings

CTA NECK

Aortic arch: Great vessel origins are patent.

Right carotid system: Patent.  No stenosis or dissection.

Left carotid system: Patent. Common carotid is partially obscured by
adjacent venous contrast. No stenosis or dissection.

Vertebral arteries: Patent and codominant. Left vertebral artery is
partially obscured by artifact from venous contrast.

Skeleton: No acute or significant abnormality.

Other neck: Unremarkable.

Upper chest: Emphysema.

Review of the MIP images confirms the above findings

CTA HEAD

Anterior circulation: Intracranial internal carotid arteries are
patent. Anterior and middle cerebral arteries are patent.

Posterior circulation: Intracranial vertebral arteries are patent.
Basilar artery is patent. Major cerebellar artery origins are
patent. Bilateral posterior communicating arteries are present.
Posterior cerebral arteries are patent.

Review of the MIP images confirms the above findings

CTV HEAD

Superior sagittal sinus, straight sinus, vein of MZT, and both
internal cerebral veins and basal veins are patent. Transverse and
sigmoid sinuses are patent.
IMPRESSION: No acute intracranial abnormality.

No large vessel occlusion, hemodynamically significant stenosis, or
evidence of dissection.

No evidence of dural sinus thrombosis.

## 2021-04-25 IMAGING — CT CT ANGIO HEAD
2 of 11 series · 7 of 33 positions shown · IV contrast (APPLIED)
Comparison: Correlation made with recent CT and MR imaging

CLINICAL DATA: Vertebral artery dissection suspected; Dural venous
sinus thrombosis suspected

EXAM:
CT ANGIOGRAPHY HEAD AND NECK
CT VENOGRAM HEAD
TECHNIQUE: Multidetector CT imaging of the head and neck was performed using
the standard protocol during bolus administration of intravenous
contrast. Multiplanar CT image reconstructions and MIPs were
obtained to evaluate the vascular anatomy. Carotid stenosis
measurements (when applicable) are obtained utilizing NASCET
criteria, using the distal internal carotid diameter as the
denominator.
Venographic phase images of the brain were obtained following above.
Multiplanar reformats and maximum intensity projections were
generated.
CONTRAST:  75mL OMNIPAQUE IOHEXOL 350 MG/ML SOLN

[Series 6: cta head · axial · 0.39mm/px · z∈[-28,+90]mm · 2 of 177 slices shown]
[im 59/177  soft-tissue]
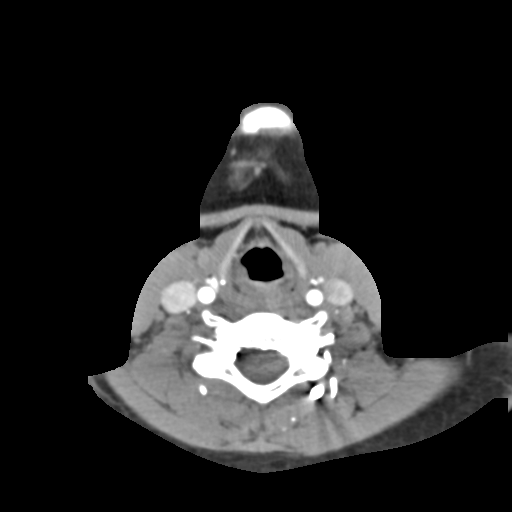
[im 118/177  soft-tissue]
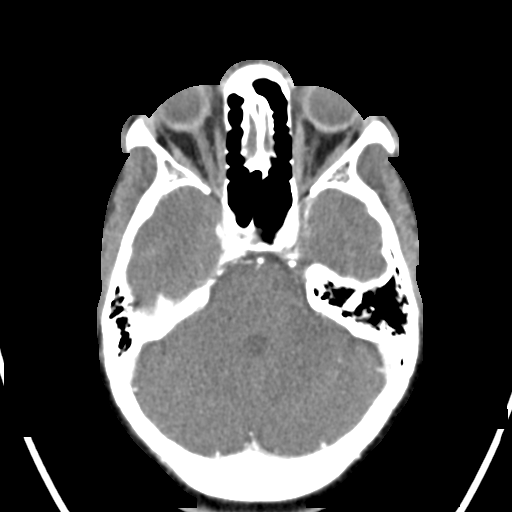

[Series 8: ax thin · axial · 0.47mm/px · z∈[-103,+128]mm · 5 of 350 slices shown]
[im 59/350  soft-tissue]
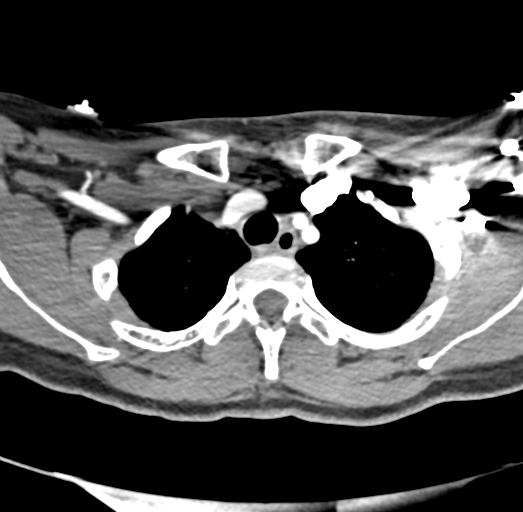
[im 117/350  bone]
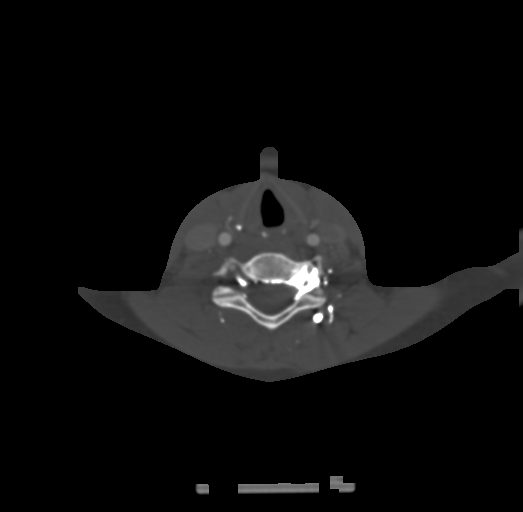
[im 175/350  soft-tissue]
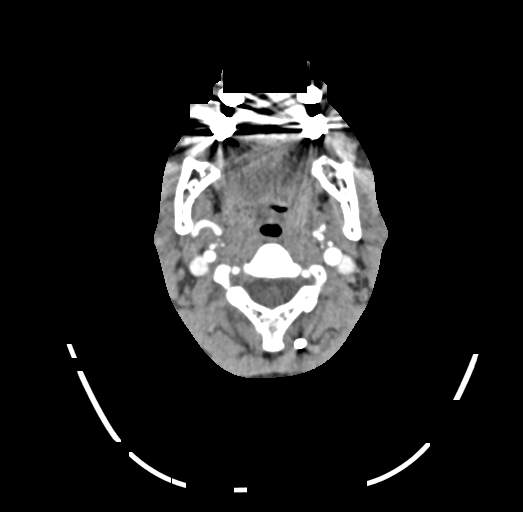
[im 233/350  bone]
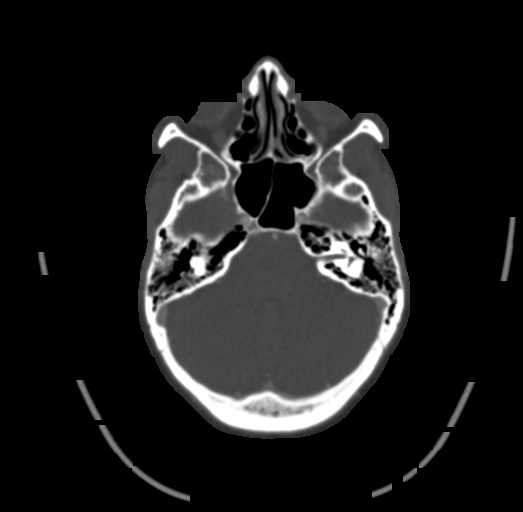
[im 291/350  soft-tissue]
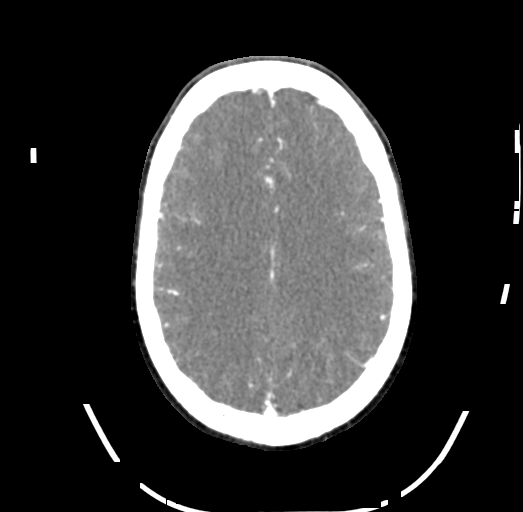

[7 of 33 positions shown; findings below may reference images not displayed]

FINDINGS: CT HEAD

Brain: There is no acute intracranial hemorrhage, mass effect, or
edema. Gray-white differentiation is preserved. There is no
extra-axial fluid collection. Ventricles and sulci are within normal
limits in size and configuration.

Vascular: No hyperdense vessel.

Skull: Calvarium is unremarkable.

Sinuses/Orbits: No acute finding.

Other: None.

Review of the MIP images confirms the above findings

CTA NECK

Aortic arch: Great vessel origins are patent.

Right carotid system: Patent.  No stenosis or dissection.

Left carotid system: Patent. Common carotid is partially obscured by
adjacent venous contrast. No stenosis or dissection.

Vertebral arteries: Patent and codominant. Left vertebral artery is
partially obscured by artifact from venous contrast.

Skeleton: No acute or significant abnormality.

Other neck: Unremarkable.

Upper chest: Emphysema.

Review of the MIP images confirms the above findings

CTA HEAD

Anterior circulation: Intracranial internal carotid arteries are
patent. Anterior and middle cerebral arteries are patent.

Posterior circulation: Intracranial vertebral arteries are patent.
Basilar artery is patent. Major cerebellar artery origins are
patent. Bilateral posterior communicating arteries are present.
Posterior cerebral arteries are patent.

Review of the MIP images confirms the above findings

CTV HEAD

Superior sagittal sinus, straight sinus, vein of MZT, and both
internal cerebral veins and basal veins are patent. Transverse and
sigmoid sinuses are patent.
IMPRESSION: No acute intracranial abnormality.

No large vessel occlusion, hemodynamically significant stenosis, or
evidence of dissection.

No evidence of dural sinus thrombosis.

## 2021-04-25 MED ORDER — DIPHENHYDRAMINE HCL 50 MG/ML IJ SOLN
25.0000 mg | Freq: Once | INTRAMUSCULAR | Status: AC
  Administered 2021-04-25: 25 mg via INTRAMUSCULAR
  Filled 2021-04-25: qty 1

## 2021-04-25 MED ORDER — VALACYCLOVIR HCL 1 G PO TABS
1000.0000 mg | ORAL_TABLET | Freq: Three times a day (TID) | ORAL | 0 refills | Status: DC
  Filled 2021-04-25: qty 21, 7d supply, fill #0

## 2021-04-25 MED ORDER — PREDNISONE 20 MG PO TABS
40.0000 mg | ORAL_TABLET | Freq: Every day | ORAL | 0 refills | Status: DC
  Filled 2021-04-25: qty 8, 4d supply, fill #0

## 2021-04-25 MED ORDER — DROPERIDOL 2.5 MG/ML IJ SOLN
1.2500 mg | Freq: Once | INTRAMUSCULAR | Status: AC
  Administered 2021-04-25: 1.25 mg via INTRAMUSCULAR
  Filled 2021-04-25: qty 2

## 2021-04-25 MED ORDER — IOHEXOL 350 MG/ML SOLN
75.0000 mL | Freq: Once | INTRAVENOUS | Status: AC | PRN
  Administered 2021-04-25: 75 mL via INTRAVENOUS

## 2021-04-25 MED ORDER — DROPERIDOL 2.5 MG/ML IJ SOLN: 1.2500 mg | Freq: Once | INTRAMUSCULAR | Status: DC

## 2021-04-25 NOTE — ED Triage Notes (Signed)
Pt was seen last week for headache and blurry vision. Pt received a MRI at [facility]. Pt returns today for same.

## 2021-04-25 NOTE — ED Notes (Signed)
Pt is alert, oriented, and ambulatory. Pt stated that she feels better and that she feels better.

## 2021-04-25 NOTE — Discharge Instructions (Signed)
Follow up with your neurologist.  Return for worsening pain, fever

## 2021-04-25 NOTE — ED Provider Notes (Signed)
[facility] EMERGENCY DEPT Provider Note   CSN: [id number] Arrival date & time: 04/25/21  1004     History Chief Complaint  Patient presents with   Headache    Melissa Blankenship is a 40 y.o. female.  40 yo F with a chief complaints of headache.  This has been an ongoing issue.  Patient has been seen now 4 times in the ED for the same.  At the initial onset she had some neurologic symptoms that was concerning for possible stroke or MS she declined advanced imaging initially and then returned today and ended up getting an MRI with and without contrast that was negative.  She tells me that she continues to have symptoms that is worse on that left side.  She feels like her vision in the left eye is blurry sometimes.  Is like the pain goes all the way into the ear.  The history is provided by the patient.  Headache Pain location:  L temporal Quality:  Sharp Radiates to:  Does not radiate Severity currently:  10/10 Severity at highest:  10/10 Onset quality:  Gradual Duration:  1 week Timing:  Intermittent Progression:  Waxing and waning Chronicity:  New Relieved by:  Nothing Worsened by:  Nothing Ineffective treatments:  None tried Associated symptoms: no congestion, no dizziness, no fever, no myalgias, no nausea and no vomiting       Past Medical History:  Diagnosis Date   Anxiety    Depression    GERD (gastroesophageal reflux disease)    Thyroid nodule    Viral upper respiratory tract infection 07/02/2020    Patient Active Problem List   Diagnosis Date Noted   Tobacco use disorder 02/10/2021   Health care maintenance 06/01/2020   Bunion 05/14/2020   GAD (generalized anxiety disorder) with panic attacks 01/09/2019   Asthma 01/09/2019    Past Surgical History:  Procedure Laterality Date   ABDOMINAL HYSTERECTOMY     BUNIONECTOMY Left 06/22/2020   INCISION AND DRAINAGE / EXCISION THYROGLOSSAL CYST  2019   path report revealed hyperplastic thyroid  tissue, non-malignant    TUBAL LIGATION       OB History   No obstetric history on file.     Family History  Problem Relation Age of Onset   Lung cancer Mother    Diabetes Mellitus II Mother    Hypertension Mother    Diabetes type II Father    Hypertension Father    CAD Maternal Grandfather     Social History   Tobacco Use   Smoking status: Some Days    Packs/day: 0.10    Types: Cigarettes   Smokeless tobacco: Never   Tobacco comments:    1 pk per month   Vaping Use   Vaping Use: Never used  Substance Use Topics   Alcohol use: Yes    Comment: Social   Drug use: No    Home Medications Prior to Admission medications   Medication Sig Start Date End Date Taking? Authorizing Provider  predniSONE (DELTASONE) 20 MG tablet Take 2 tablets (40 mg total) by mouth daily for 4 days. 04/25/21  Yes Melene Plan, DO  valACYclovir (VALTREX) 1000 MG tablet Take 1 tablet (1,000 mg total) by mouth 3 (three) times daily. 04/25/21  Yes Melene Plan, DO  albuterol (VENTOLIN HFA) 108 (90 Base) MCG/ACT inhaler Inhale 2 puffs into the lungs every 6 (six) hours as needed for wheezing or shortness of breath. 01/09/19   Elige Radon, MD  ALPRAZolam (  XANAX) 0.25 MG tablet Take 1-2 tablets (0.25-0.5 mg total) by mouth 3 (three) times daily as needed (Panic attacks). 12/24/20   Verdene Lennert, MD  escitalopram (LEXAPRO) 20 MG tablet Take 1 tablet (20 mg total) by mouth daily. Patient taking differently: Take 20 mg by mouth daily as needed (anxiety). 12/24/20   Verdene Lennert, MD  ibuprofen (ADVIL) 800 MG tablet Take 800 mg by mouth every 8 (eight) hours as needed for headache or mild pain.    [provider]  meloxicam (MOBIC) 15 MG tablet TAKE 1 TABLET (15 MG TOTAL) BY MOUTH DAILY. Patient not taking: No sig reported 07/22/20 07/22/21  Lenn Sink, DPM  prochlorperazine (COMPAZINE) 10 MG tablet Take 1 tablet (10 mg total) by mouth every 8 (eight) hours as needed for nausea or vomiting  (headaches as well). 04/23/21   Tegeler, Canary Brim, MD    Allergies    Patient has no known allergies.  Review of Systems   Review of Systems  Constitutional:  Negative for chills and fever.  HENT:  Negative for congestion and rhinorrhea.   Eyes:  Negative for redness and visual disturbance.  Respiratory:  Negative for shortness of breath and wheezing.   Cardiovascular:  Negative for chest pain and palpitations.  Gastrointestinal:  Negative for nausea and vomiting.  Genitourinary:  Negative for dysuria and urgency.  Musculoskeletal:  Negative for arthralgias and myalgias.  Skin:  Negative for pallor and wound.  Neurological:  Positive for headaches. Negative for dizziness.   Physical Exam Updated Vital Signs BP (!) 117/91 (BP Location: Right Arm)   Pulse 74   Temp 98.4 F (36.9 C) (Oral)   Resp 18   Ht 5\' 3"  (1.6 m)   Wt 57.2 kg   LMP 04/29/2011   SpO2 100%   BMI 22.34 kg/m   Physical Exam Vitals and nursing note reviewed.  Constitutional:      General: She is not in acute distress.    Appearance: She is well-developed. She is not diaphoretic.  HENT:     Head: Normocephalic and atraumatic.     Comments: Swollen turbinates, posterior nasal drip, no noted sinus ttp, tm normal bilaterally.  Pain with mobilization of the external ear canal.  No obvious lesion identified in the ear canal but mildly erythematous compared to the other side.  Eyes:     Pupils: Pupils are equal, round, and reactive to light.  Cardiovascular:     Rate and Rhythm: Normal rate and regular rhythm.     Heart sounds: No murmur heard.   No friction rub. No gallop.  Pulmonary:     Effort: Pulmonary effort is normal.     Breath sounds: No wheezing or rales.  Abdominal:     General: There is no distension.     Palpations: Abdomen is soft.     Tenderness: There is no abdominal tenderness.  Musculoskeletal:        General: No tenderness.     Cervical back: Normal range of motion and neck  supple.  Skin:    General: Skin is warm and dry.  Neurological:     Mental Status: She is alert and oriented to person, place, and time.     Comments: Subjective decrease sensation to the left side of her face, possible facial nerve palsy with decreased brow movement on the left compared to right.  Psychiatric:        Behavior: Behavior normal.    ED Results / Procedures / Treatments  Labs (all labs ordered are listed, but only abnormal results are displayed) Labs Reviewed - No data to display  EKG None  Radiology CT Angio Head W or Wo Contrast  Result Date: 04/25/2021 CLINICAL DATA:  Vertebral artery dissection suspected; Dural venous sinus thrombosis suspected EXAM: CT ANGIOGRAPHY HEAD AND NECK CT VENOGRAM HEAD TECHNIQUE: Multidetector CT imaging of the head and neck was performed using the standard protocol during bolus administration of intravenous contrast. Multiplanar CT image reconstructions and MIPs were obtained to evaluate the vascular anatomy. Carotid stenosis measurements (when applicable) are obtained utilizing NASCET criteria, using the distal internal carotid diameter as the denominator. Venographic phase images of the brain were obtained following above. Multiplanar reformats and maximum intensity projections were generated. CONTRAST:  74mL OMNIPAQUE IOHEXOL 350 MG/ML SOLN COMPARISON:  Correlation made with recent CT and MR imaging FINDINGS: CT HEAD Brain: There is no acute intracranial hemorrhage, mass effect, or edema. Gray-white differentiation is preserved. There is no extra-axial fluid collection. Ventricles and sulci are within normal limits in size and configuration. Vascular: No hyperdense vessel. Skull: Calvarium is unremarkable. Sinuses/Orbits: No acute finding. Other: None. Review of the MIP images confirms the above findings CTA NECK Aortic arch: Great vessel origins are patent. Right carotid system: Patent.  No stenosis or dissection. Left carotid system: Patent.  Common carotid is partially obscured by adjacent venous contrast. No stenosis or dissection. Vertebral arteries: Patent and codominant. Left vertebral artery is partially obscured by artifact from venous contrast. Skeleton: No acute or significant abnormality. Other neck: Unremarkable. Upper chest: Emphysema. Review of the MIP images confirms the above findings CTA HEAD Anterior circulation: Intracranial internal carotid arteries are patent. Anterior and middle cerebral arteries are patent. Posterior circulation: Intracranial vertebral arteries are patent. Basilar artery is patent. Major cerebellar artery origins are patent. Bilateral posterior communicating arteries are present. Posterior cerebral arteries are patent. Review of the MIP images confirms the above findings CTV HEAD Superior sagittal sinus, straight sinus, vein of Galen, and both internal cerebral veins and basal veins are patent. Transverse and sigmoid sinuses are patent. IMPRESSION: No acute intracranial abnormality. No large vessel occlusion, hemodynamically significant stenosis, or evidence of dissection. No evidence of dural sinus thrombosis. Electronically Signed   By: Guadlupe Spanish M.D.   On: 04/25/2021 14:00   CT Angio Neck W and/or Wo Contrast  Result Date: 04/25/2021 CLINICAL DATA:  Vertebral artery dissection suspected; Dural venous sinus thrombosis suspected EXAM: CT ANGIOGRAPHY HEAD AND NECK CT VENOGRAM HEAD TECHNIQUE: Multidetector CT imaging of the head and neck was performed using the standard protocol during bolus administration of intravenous contrast. Multiplanar CT image reconstructions and MIPs were obtained to evaluate the vascular anatomy. Carotid stenosis measurements (when applicable) are obtained utilizing NASCET criteria, using the distal internal carotid diameter as the denominator. Venographic phase images of the brain were obtained following above. Multiplanar reformats and maximum intensity projections were  generated. CONTRAST:  41mL OMNIPAQUE IOHEXOL 350 MG/ML SOLN COMPARISON:  Correlation made with recent CT and MR imaging FINDINGS: CT HEAD Brain: There is no acute intracranial hemorrhage, mass effect, or edema. Gray-white differentiation is preserved. There is no extra-axial fluid collection. Ventricles and sulci are within normal limits in size and configuration. Vascular: No hyperdense vessel. Skull: Calvarium is unremarkable. Sinuses/Orbits: No acute finding. Other: None. Review of the MIP images confirms the above findings CTA NECK Aortic arch: Great vessel origins are patent. Right carotid system: Patent.  No stenosis or dissection. Left carotid system: Patent. Common carotid is partially  obscured by adjacent venous contrast. No stenosis or dissection. Vertebral arteries: Patent and codominant. Left vertebral artery is partially obscured by artifact from venous contrast. Skeleton: No acute or significant abnormality. Other neck: Unremarkable. Upper chest: Emphysema. Review of the MIP images confirms the above findings CTA HEAD Anterior circulation: Intracranial internal carotid arteries are patent. Anterior and middle cerebral arteries are patent. Posterior circulation: Intracranial vertebral arteries are patent. Basilar artery is patent. Major cerebellar artery origins are patent. Bilateral posterior communicating arteries are present. Posterior cerebral arteries are patent. Review of the MIP images confirms the above findings CTV HEAD Superior sagittal sinus, straight sinus, vein of Galen, and both internal cerebral veins and basal veins are patent. Transverse and sigmoid sinuses are patent. IMPRESSION: No acute intracranial abnormality. No large vessel occlusion, hemodynamically significant stenosis, or evidence of dissection. No evidence of dural sinus thrombosis. Electronically Signed   By: Guadlupe Spanish M.D.   On: 04/25/2021 14:00   CT VENOGRAM HEAD  Result Date: 04/25/2021 CLINICAL DATA:  Vertebral  artery dissection suspected; Dural venous sinus thrombosis suspected EXAM: CT ANGIOGRAPHY HEAD AND NECK CT VENOGRAM HEAD TECHNIQUE: Multidetector CT imaging of the head and neck was performed using the standard protocol during bolus administration of intravenous contrast. Multiplanar CT image reconstructions and MIPs were obtained to evaluate the vascular anatomy. Carotid stenosis measurements (when applicable) are obtained utilizing NASCET criteria, using the distal internal carotid diameter as the denominator. Venographic phase images of the brain were obtained following above. Multiplanar reformats and maximum intensity projections were generated. CONTRAST:  68mL OMNIPAQUE IOHEXOL 350 MG/ML SOLN COMPARISON:  Correlation made with recent CT and MR imaging FINDINGS: CT HEAD Brain: There is no acute intracranial hemorrhage, mass effect, or edema. Gray-white differentiation is preserved. There is no extra-axial fluid collection. Ventricles and sulci are within normal limits in size and configuration. Vascular: No hyperdense vessel. Skull: Calvarium is unremarkable. Sinuses/Orbits: No acute finding. Other: None. Review of the MIP images confirms the above findings CTA NECK Aortic arch: Great vessel origins are patent. Right carotid system: Patent.  No stenosis or dissection. Left carotid system: Patent. Common carotid is partially obscured by adjacent venous contrast. No stenosis or dissection. Vertebral arteries: Patent and codominant. Left vertebral artery is partially obscured by artifact from venous contrast. Skeleton: No acute or significant abnormality. Other neck: Unremarkable. Upper chest: Emphysema. Review of the MIP images confirms the above findings CTA HEAD Anterior circulation: Intracranial internal carotid arteries are patent. Anterior and middle cerebral arteries are patent. Posterior circulation: Intracranial vertebral arteries are patent. Basilar artery is patent. Major cerebellar artery origins are  patent. Bilateral posterior communicating arteries are present. Posterior cerebral arteries are patent. Review of the MIP images confirms the above findings CTV HEAD Superior sagittal sinus, straight sinus, vein of Galen, and both internal cerebral veins and basal veins are patent. Transverse and sigmoid sinuses are patent. IMPRESSION: No acute intracranial abnormality. No large vessel occlusion, hemodynamically significant stenosis, or evidence of dissection. No evidence of dural sinus thrombosis. Electronically Signed   By: Guadlupe Spanish M.D.   On: 04/25/2021 14:00    Procedures Procedures   Medications Ordered in ED Medications  diphenhydrAMINE (BENADRYL) injection 25 mg (25 mg Intramuscular Given 04/25/21 1149)  droperidol (INAPSINE) 2.5 MG/ML injection 1.25 mg (1.25 mg Intramuscular Given 04/25/21 1152)  iohexol (OMNIPAQUE) 350 MG/ML injection 75 mL (75 mLs Intravenous Contrast Given 04/25/21 1316)    ED Course  I have reviewed the triage vital signs and the nursing notes.  Pertinent labs & imaging results that were available during my care of the patient were reviewed by me and considered in my medical decision making (see chart for details).    MDM Rules/Calculators/A&P                           40 yo F with a chief complaints of left-sided facial pain going on for about a week now.  Has been seen in the ED now 4 times for the same.  Had an MRI with and without contrast done yesterday that was negative.  Back with the same today.  Signs of upper respiratory illness on my initial exam.  No obvious rash to the left side of the face.  Left external ear canal appears to be fairly sensitive.  I wonder if the patient has shingles but no rash as of yet on the left.  I discussed case with Dr. Amada Jupiter, neurology he felt that we had not yet ruled out CVA carotid artery dissection or venous sinus thrombosis and felt that would be able to rule that out effectively with a CT angiogram of the  head and neck and a CT venogram.  CT scans are negative no signs of aneurysm no signs of dissection no signs of venous sinus thrombosis.  Patient feeling a bit better after headache cocktail here.  We will treat for possible shingles.  PCP follow-up.  3:23 PM:  I have discussed the diagnosis/risks/treatment options with the patient and believe the pt to be eligible for discharge home to follow-up with PCP, neuro. We also discussed returning to the ED immediately if new or worsening sx occur. We discussed the sx which are most concerning (e.g., sudden worsening pain, fever, inability to tolerate by mouth) that necessitate immediate return. Medications administered to the patient during their visit and any new prescriptions provided to the patient are listed below.  Medications given during this visit Medications  diphenhydrAMINE (BENADRYL) injection 25 mg (25 mg Intramuscular Given 04/25/21 1149)  droperidol (INAPSINE) 2.5 MG/ML injection 1.25 mg (1.25 mg Intramuscular Given 04/25/21 1152)  iohexol (OMNIPAQUE) 350 MG/ML injection 75 mL (75 mLs Intravenous Contrast Given 04/25/21 1316)     The patient appears reasonably screen and/or stabilized for discharge and I doubt any other medical condition or other [facility] requiring further screening, evaluation, or treatment in the ED at this time prior to discharge.   Final Clinical Impression(s) / ED Diagnoses Final diagnoses:  Left-sided headache    Rx / DC Orders ED Discharge Orders          Ordered    valACYclovir (VALTREX) 1000 MG tablet  3 times daily        04/25/21 1419    predniSONE (DELTASONE) 20 MG tablet  Daily        04/25/21 1419             Melene Plan, DO 04/25/21 1523

## 2021-04-25 NOTE — Telephone Encounter (Signed)
Received TC from patient who is wanting to move her clinic appt from 11/17 to 11/15 with Dr. Alroy Bailiff.  She states she has been to the ED 3 times recently for H/A & facial /arm numbness and ED recommended f/u with PCP and referral to Neuro.  She states she woke up this morning with H/A and facial numbness again (no slurred speech noted during conversation). Discussed with Dr. Alroy Bailiff who states she has an opening tomorrow and patient can be scheduled in the slot. Instructed patient appt moved to tomorrow, 0845, but she is to report to ED now for evaluation of current H/A and facial and arm numbness.  She verbalized understanding. SChaplin, RN,BSN

## 2021-04-26 ENCOUNTER — Other Ambulatory Visit: Payer: Self-pay

## 2021-04-26 ENCOUNTER — Ambulatory Visit (INDEPENDENT_AMBULATORY_CARE_PROVIDER_SITE_OTHER): Payer: No Typology Code available for payment source | Admitting: Internal Medicine

## 2021-04-26 ENCOUNTER — Encounter: Payer: Self-pay | Admitting: Internal Medicine

## 2021-04-26 ENCOUNTER — Other Ambulatory Visit (HOSPITAL_COMMUNITY): Payer: Self-pay

## 2021-04-26 VITALS — BP 114/82 | HR 87 | Temp 98.6°F | Ht 63.0 in | Wt 126.9 lb

## 2021-04-26 DIAGNOSIS — R519 Headache, unspecified: Secondary | ICD-10-CM | POA: Diagnosis not present

## 2021-04-26 DIAGNOSIS — F411 Generalized anxiety disorder: Secondary | ICD-10-CM

## 2021-04-26 DIAGNOSIS — H538 Other visual disturbances: Secondary | ICD-10-CM

## 2021-04-26 MED ORDER — SUMATRIPTAN SUCCINATE 50 MG PO TABS
50.0000 mg | ORAL_TABLET | Freq: Four times a day (QID) | ORAL | 6 refills | Status: DC | PRN
  Filled 2021-04-26: qty 18, 30d supply, fill #0

## 2021-04-26 MED ORDER — SUMATRIPTAN SUCCINATE 50 MG PO TABS
50.0000 mg | ORAL_TABLET | Freq: Four times a day (QID) | ORAL | 6 refills | Status: DC | PRN
  Filled 2021-04-26: qty 40, 10d supply, fill #0

## 2021-04-26 NOTE — Assessment & Plan Note (Signed)
Patient is currently taking Lexapro for her anxiety/depression.  She states that this has helped to alleviate her symptoms.  Score of 10 on PHQ-9 today.  Patient states that she has been seen by Dr. Sallyanne Kuster here in the past, however she has not followed up with her since the beginning of the year.  She would like to schedule an appointment with Dr. Monna Fam soon.  -Schedule appointment with Dr. Monna Fam, will provide contact information to arrange appointment -Continue Lexapro 20 mg daily

## 2021-04-26 NOTE — Assessment & Plan Note (Addendum)
The patient presents the clinic today with left-sided headache with associated blurriness, numbness, weakness.  She has been seen in the ED 4 times over the past several days for the same issue.  During earlier ED visits, she was given pain medication for her headaches.  She initially declined imaging due to anxiety with MRI machines, however during later ED visits that she decided to proceed with this.  CT head unremarkable, MRI head unremarkable, CT angio also unremarkable.  During her most recent ED visit, she was prescribed Valtrex for possible shingles infection.    In terms of her history, she denies any autoimmune conditions, recent hiking trips/tick bites, or recent illnesses.  She has had shingles in the past and sees 2013).  Today, the patient continues to endorse a headache on the left side only.  She has not yet picked up her Valtrex from the pharmacy.  She says that the headache started last Wednesday when she was at work.  It came on all of a sudden, prompting her to go to the ED.  She has never had this happen before.  Prior to Wednesday, she was having some left-sided headaches but states that they were "nothing out of the ordinary."  She continues to have blurry vision as well as weakness in the left leg and left arm.  She has tried Motrin and Tylenol to help alleviate the pain, but this provides minimal relief.  On exam, she has decreased sensation the left V1, V2, V3 regions.  Able to raise eyebrows bilaterally and has symmetric smile, although she does appear to have a slight left-sided facial droop today.  Strength is 4/5 on the left side, upper extremity and lower extremity.  P: At this time, we advised the patient to take her Valtrex regimen.  We discussed that it is possible that shingles can reactivate in a different dermatome (she states that she had shingles in 2013 in her abdominal region).  We have also prescribed her Imitrex today for abortive therapy.  We instructed her to take  50 mg as needed for the headache but to not exceed 200 mg in a day.  We have also referred her to ophthalmology today for the blurry vision. Yesterday, the ED referred the patient to [facility] neurology, and she is currently scheduled for an appointment in February.  We will reach out to [place] to see if it is possible for her to be seen sooner.  -Imitrex 50 mg as needed, not to exceed 100 mg at a time or 200 mg in a day -Continue Valtrex

## 2021-04-26 NOTE — Patient Instructions (Addendum)
Thank you, [name] for allowing [place] to provide your care today. Today we discussed your blurry vision and numbness/weakness.  1) I have prescribed Imitrex for you. DO NOT exceed 200 mg in a day. The max you can take at a time is 100 mg. Take as needed for the pain.    2) I have referred you to Ophthalmology  3) We will try to get you in sooner to Neurology  I have ordered the following labs for you:  Lab Orders  No laboratory test(s) ordered today     Referrals ordered today:   Referral Orders         Ambulatory referral to Ophthalmology      I have ordered the following medication/changed the following medications:   Stop the following medications: Medications Discontinued During This Encounter  Medication Reason   SUMAtriptan (IMITREX) 50 MG tablet      Start the following medications: Meds ordered this encounter  Medications   DISCONTD: SUMAtriptan (IMITREX) 50 MG tablet    Sig: Take 1 tablet (50 mg total) by mouth 4 (four) times daily as needed for migraine. May repeat in 2 hours if headache persists or recurs.    Dispense:  40 tablet    Refill:  6   SUMAtriptan (IMITREX) 50 MG tablet    Sig: Take 1 tablet (50 mg total) by mouth 4 (four) times daily as needed for migraine.    Dispense:  40 tablet    Refill:  6     Follow up: 3 months    Should you have any questions or concerns please call the internal medicine clinic at [phone].

## 2021-04-26 NOTE — Progress Notes (Signed)
   CC: left-sided headache and numbness  HPI:  [name] is a 40 y.o. with past medical history as noted below who presents to the clinic today for left-sided headache with associated numbness and weakness. Please see problem-based list for further details, assessments, and plans.    Past Medical History:  Diagnosis Date   Anxiety    Depression    GERD (gastroesophageal reflux disease)    Thyroid nodule    Viral upper respiratory tract infection 07/02/2020   Review of Systems:  Review of Systems  Constitutional: Negative.   HENT: Negative.    Eyes:  Positive for blurred vision.  Respiratory: Negative.    Cardiovascular: Negative.   Gastrointestinal: Negative.   Genitourinary: Negative.   Musculoskeletal: Negative.   Skin: Negative.   Neurological:  Positive for sensory change, weakness and headaches.  Endo/Heme/Allergies: Negative.   Psychiatric/Behavioral:  The patient is nervous/anxious.     Physical Exam:  Vitals:   04/26/21 0838 04/26/21 0851  BP: 99/71 114/82  Pulse: 79 87  Temp: 98.6 F (37 C)   TempSrc: Oral   SpO2: 100%   Weight: 126 lb 14.4 oz (57.6 kg)   Height: 5\' 3"  (1.6 m)    Physical Exam Constitutional:      Appearance: Normal appearance.  HENT:     Head: Normocephalic and atraumatic.     Left Ear: Tympanic membrane and external ear normal. There is no impacted cerumen.  Eyes:     Extraocular Movements: Extraocular movements intact.     Conjunctiva/sclera: Conjunctivae normal.     Pupils: Pupils are equal, round, and reactive to light.  Cardiovascular:     Rate and Rhythm: Normal rate and regular rhythm.     Heart sounds: No murmur heard.   No friction rub. No gallop.  Pulmonary:     Effort: Pulmonary effort is normal.     Breath sounds: Normal breath sounds. No wheezing, rhonchi or rales.  Abdominal:     General: Abdomen is flat. There is no distension.  Musculoskeletal:     Right lower leg: No edema.     Left lower leg: No  edema.  Skin:    General: Skin is warm and dry.     Findings: No lesion or rash.  Neurological:     Mental Status: She is alert and oriented to person, place, and time.     Comments: Decreased sensation at V1, V2, V3 regions on the left side of the face.  There is slight facial droop on the left side.  Able to raise eyebrows, symmetric smile Strength 4/5 LUE and LLE. Str 5/5 on the right side  Psychiatric:        Mood and Affect: Mood normal.        Behavior: Behavior normal.     Assessment & Plan:   See Encounters Tab for problem based charting.  Patient seen with Dr. 

## 2021-04-28 ENCOUNTER — Encounter: Payer: No Typology Code available for payment source | Admitting: Internal Medicine

## 2021-04-28 ENCOUNTER — Ambulatory Visit: Payer: No Typology Code available for payment source | Admitting: Sports Medicine

## 2021-04-29 NOTE — Progress Notes (Signed)
Internal Medicine Clinic Attending ? ?I saw and evaluated the patient.  I personally confirmed the key portions of the history and exam documented by Dr. Bonanno and I reviewed pertinent patient test results.  The assessment, diagnosis, and plan were formulated together and I agree with the documentation in the resident?s note. ? ?

## 2021-05-17 ENCOUNTER — Other Ambulatory Visit: Payer: Self-pay | Admitting: Internal Medicine

## 2021-05-18 ENCOUNTER — Telehealth: Payer: Self-pay | Admitting: Behavioral Health

## 2021-05-18 ENCOUNTER — Ambulatory Visit (INDEPENDENT_AMBULATORY_CARE_PROVIDER_SITE_OTHER): Payer: No Typology Code available for payment source | Admitting: Internal Medicine

## 2021-05-18 ENCOUNTER — Other Ambulatory Visit (HOSPITAL_COMMUNITY): Payer: Self-pay

## 2021-05-18 ENCOUNTER — Encounter: Payer: Self-pay | Admitting: Internal Medicine

## 2021-05-18 DIAGNOSIS — F411 Generalized anxiety disorder: Secondary | ICD-10-CM

## 2021-05-18 MED ORDER — TRAZODONE HCL 50 MG PO TABS
50.0000 mg | ORAL_TABLET | Freq: Every day | ORAL | 1 refills | Status: DC
Start: 2021-05-18 — End: 2023-03-26
  Filled 2021-05-18: qty 90, 90d supply, fill #0

## 2021-05-18 MED ORDER — ALPRAZOLAM 0.25 MG PO TABS
0.2500 mg | ORAL_TABLET | Freq: Three times a day (TID) | ORAL | 0 refills | Status: DC | PRN
  Filled 2021-05-18: qty 90, 15d supply, fill #0

## 2021-05-18 NOTE — Telephone Encounter (Signed)
Great! Thank you. It looks like she is not on my schedule for today. She has an appointment with me 12/28 but I think it would be good to arrange the telehealth for this afternoon.

## 2021-05-18 NOTE — Telephone Encounter (Signed)
Spoke w/Pt per Pt request to Dr. Bradd Burner, MD today @ her telemedicine visit. Pt agreed to appt on Mon, Dec 12th @ 3:00pm. Encouraged Pt & welcomed her invitation to re-start psychotherapy as she traverses this difficult time.  Dr. Monna Fam

## 2021-05-18 NOTE — Telephone Encounter (Signed)
Request for prescription refill for Alprazolam.   Patient last received back in July 2022 for Anxiety Attacks.  Call to patient states is having Anxiety Attacks frequently now and would like to get a refill.  Patient has spoken with Dr. Monna Fam here before and is willing to be scheduled again with her.  No appointment were available.  Patient scheduled this afternoon with her PCP Telehealth to discuss need for refill.

## 2021-05-18 NOTE — Assessment & Plan Note (Addendum)
Telehealth visit: HPI: She notes worsening anxiety and panic attacks over the past few weeks. Panic attack frequency has increased from 1/week to 3x/week now. These are typically associated with the headaches that she has been seen in the ED for multiple times. Denies SI. Due to the increased frequency in panic attacks, she has been using the xanax more frequent. Unfortunately, she has discontinue the lexapro again. She acknowledges that it does work when she takes it consistently but then, once she is feeling better, discontinues it again. I had previously prescribed her trazodone last December. She has restarted taking 50mg  at night which helps her both fall and stay asleep. She has visited with Dr. in the past, but not recently, and would like to pick back up with her. 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 Over thanksgiving weekend, her mother's PEG tube became "twisted", requiring emergent surgery. She identifies this as having contributed to her worsening symptoms.  Assessment: Poorly controlled GAD with concomitant panic disorder. Complicated by medication non-adherence and social circumstances. Plan  Xanax 0.25-0.5mg  TID PRN #90 refilled  I provided education regarding the importance of consistent use of Lexapro.  Continue trazodone 50mg   Message sent to [id number] to notify her of patient's wish to restart her visits with her  She will plan to follow up with me in the office towards the end of the month for completion of FMLA paperwork. I will plan to obtain a UDS at that time and to have her sign a controlled medication contract.

## 2021-05-18 NOTE — Progress Notes (Signed)
  [facility] Internal Medicine Residency Telephone Encounter Continuity Care Appointment  HPI:  This telephone encounter was created for [name] on 05/18/2021 for the following purpose/cc GAD/panic disorder management.   Assessment / Plan / Recommendations:   HPI: She notes worsening anxiety and panic attacks over the past few weeks. Panic attack frequency has increased from 1/week to 3x/week now. These are typically associated with the headaches that she has been seen in the ED for multiple times. Denies SI. Due to the increased frequency in panic attacks, she has been using the xanax more frequent. Unfortunately, she has discontinue the lexapro again. She acknowledges that it does work when she takes it consistently but then, once she is feeling better, discontinues it again. I had previously prescribed her trazodone last December. She has restarted taking 50mg  at night which helps her both fall and stay asleep. She has visited with Dr. in the past, but not recently, and would like to pick back up with her. 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 Over thanksgiving weekend, her mother's PEG tube became "twisted", requiring emergent surgery. She identifies this as having contributed to her worsening symptoms.  Assessment: Poorly controlled GAD with concomitant panic disorder. Complicated by medication non-adherence and social circumstances. Plan Xanax 0.25-0.5mg  TID PRN #90 refilled I provided education regarding the importance of consistent use of Lexapro. Continue trazodone 50mg  Message sent to [id number] to notify her of patient's wish to restart her visits with her She will plan to follow up with me in the office towards the end of the month for completion of FMLA paperwork. I will plan to obtain a UDS at that time and to have her sign a controlled medication contract.  As always, pt is advised that if  symptoms worsen or new symptoms arise, they should go to an urgent care facility or to to ER for further evaluation.   Consent and Medical Decision Making:  Patient discussed with Dr. This is a telephone encounter between [place] and [facility] on 05/18/2021 for GAD/panic disorder f/u. The visit was conducted with the patient located at home and [employer] at [facility]. The patient's identity was confirmed using their DOB and current address. The patient has consented to being evaluated through a telephone encounter and understands the associated risks (an examination cannot be done and the patient may need to come in for an appointment) / benefits (allows the patient to remain at home, decreasing exposure to coronavirus). I personally spent 31 minutes on medical discussion.     [employer], MD Internal Medicine Resident PGY-3 [facility] Internal Medicine Residency 05/18/2021 2:31 PM

## 2021-05-19 NOTE — Progress Notes (Signed)
Internal Medicine Clinic Attending  Case discussed with Dr. Christian  At the time of the visit.  We reviewed the resident's history and exam and pertinent patient test results.  I agree with the assessment, diagnosis, and plan of care documented in the resident's note.  

## 2021-05-23 ENCOUNTER — Ambulatory Visit: Payer: No Typology Code available for payment source | Admitting: Behavioral Health

## 2021-05-23 DIAGNOSIS — F331 Major depressive disorder, recurrent, moderate: Secondary | ICD-10-CM

## 2021-05-23 DIAGNOSIS — R519 Headache, unspecified: Secondary | ICD-10-CM

## 2021-05-23 DIAGNOSIS — F411 Generalized anxiety disorder: Secondary | ICD-10-CM

## 2021-05-23 NOTE — BH Specialist Note (Signed)
Integrated Behavioral Health via Telemedicine Visit  05/23/2021 Melissa Blankenship [id number]  Number of Integrated Behavioral Health visits: 6/6 Session Start time: 2:50pm  Session End time: 3:30pm Total time: 40   Referring Provider: Dr. Bradd Burner, MD Patient/Family location: Pt is home in private [facility] Provider location: [facility] Office All persons participating in visit: [facility] Office Types of Service: Individual psychotherapy  I connected with Melissa Blankenship and/or Melissa Blankenship's  self  via  Psychologist, clinical  (Video is Caregility application) and verified that I am speaking with the correct person using two identifiers. Discussed confidentiality:  6th visit  I discussed the limitations of telemedicine and the availability of in person appointments.  Discussed there is a possibility of technology failure and discussed alternative modes of communication if that failure occurs.  I discussed that engaging in this telemedicine visit, they consent to the provision of behavioral healthcare and the services will be billed under their insurance.  Patient and/or legal guardian expressed understanding and consented to Telemedicine visit:  6th visit  Presenting Concerns: Patient and/or family reports the following symptoms/concerns: elevated anx/dep, sickness @ home, & even pet dog is sick. Gmother has recently had a procedure to replace her feeding tube Duration of problem: yrs; Severity of problem: moderate trending elevation w/resulting panic level anxiety  Patient and/or Family's Strengths/Protective Factors: Social and Emotional competence, Concrete supports in place (healthy food, safe environments, etc.), Sense of purpose, and Physical Health (exercise, healthy diet, medication compliance, etc.)  Goals Addressed: Patient will:  Reduce symptoms of: anxiety, depression, and stress   Increase knowledge and/or ability of: coping skills, healthy  habits, and stress reduction   Demonstrate ability to: Increase healthy adjustment to current life circumstances, Increase motivation to adhere to plan of care, and Improve medication compliance  Progress towards Goals: Ongoing  Interventions: Interventions utilized:  Solution-Focused Strategies, Behavioral Activation, and Supportive Counseling Standardized Assessments completed:  screeners prn  Patient and/or Family Response: Pt receptive to call today & agrees to future consistent telehealth calls  Assessment: Patient currently experiencing elevated anx/dep since her Mother's tube replacement, Dtr's graduation & cold, her dog's pneumonia, & her own immune system that is weakened. Pt has visited ED 4 times over the course of a 10 day period in Nov when her stressors were most elevated.   Patient may benefit from cont'd & consistent calls for mental health wellness & to navigate this Holiday season w/all the usual stressors. Pt will optimize her use of tools mailed from Clinician & share w/her 42yo Dtr as needed.  Plan: Follow up with behavioral health clinician on : early Jan for 30 min ck-in session Behavioral recommendations: Journal as able. Ck w/IMC re: dosing of Trazadone if morning fuzziness continues. Take adequate time to give yourself self-care that renews & refreshes-engaging the senses can be helpful-even if for a few moments in your day. Olfactory sensing has a strong & powerful impact on well-being. Referral(s): Integrated [employer] (In Clinic)  I discussed the assessment and treatment plan with the patient and/or parent/guardian. They were provided an opportunity to ask questions and all were answered. They agreed with the plan and demonstrated an understanding of the instructions.   They were advised to call back or seek an in-person evaluation if the symptoms worsen or if the condition fails to improve as anticipated.  Melissa Lever, LMFT

## 2021-05-26 ENCOUNTER — Ambulatory Visit: Payer: No Typology Code available for payment source | Admitting: Sports Medicine

## 2021-05-27 ENCOUNTER — Other Ambulatory Visit (HOSPITAL_COMMUNITY): Payer: Self-pay

## 2021-05-27 MED FILL — Meloxicam Tab 15 MG: ORAL | Qty: 30 | Fill #0 | Status: CN

## 2021-06-03 ENCOUNTER — Encounter: Payer: Self-pay | Admitting: Internal Medicine

## 2021-06-07 NOTE — Telephone Encounter (Signed)
Form has been printed out and put in the yellow team box for Dr. Ephriam Knuckles to completed.

## 2021-06-08 ENCOUNTER — Telehealth: Payer: No Typology Code available for payment source | Admitting: Internal Medicine

## 2021-06-09 ENCOUNTER — Ambulatory Visit (INDEPENDENT_AMBULATORY_CARE_PROVIDER_SITE_OTHER): Payer: No Typology Code available for payment source | Admitting: Internal Medicine

## 2021-06-09 VITALS — BP 114/50 | HR 66 | Wt 130.2 lb

## 2021-06-09 DIAGNOSIS — R519 Headache, unspecified: Secondary | ICD-10-CM

## 2021-06-09 DIAGNOSIS — F411 Generalized anxiety disorder: Secondary | ICD-10-CM

## 2021-06-09 DIAGNOSIS — Z23 Encounter for immunization: Secondary | ICD-10-CM

## 2021-06-09 NOTE — Assessment & Plan Note (Addendum)
She has been taking the Lexapro consistently now for the past 2 weeks.  She reports feeling slightly better and less tearful.  We discussed the recent passing of her dog that she has had for 9 years which has been difficult for her however she feels like she is coping with that better than she would have before she restarted the Lexapro.  Discussed that the Lexapro may still take a few more weeks before it becomes maximally effective. Sleeping has been more difficult for her since her dog died.  She reports using the trazodone on the days that she does not have to work but otherwise uses melatonin.  She does this because she feels like the trazodone still makes her somewhat sleepy in the morning. Plan: No changes in therapy today.  She will continue to current management with Lexapro 20 mg daily, as needed trazodone at night, as needed Xanax for panic attacks.  We did not collect a UDS at today's visit but we will try to remember to get this done when she returns to the clinic in the future. No red flags or excessive requests for refills--seems to use appropriately.

## 2021-06-09 NOTE — Progress Notes (Signed)
Office Visit   Patient ID: Melissa Blankenship, female    DOB: 1980/12/02, 40 y.o.   MRN: [id number]   PCP: Elige Radon, MD   Subjective:  Melissa Blankenship is a 40 y.o. year old female who presents for follow up of anxiety and panic disorder. Please refer to problem based charting for assessment and plan.  ACTIVE MEDICATIONS   Outpatient Medications Prior to Visit  Medication Sig   albuterol (VENTOLIN HFA) 108 (90 Base) MCG/ACT inhaler Inhale 2 puffs into the lungs every 6 (six) hours as needed for wheezing or shortness of breath.   ALPRAZolam (XANAX) 0.25 MG tablet Take 1-2 tablets (0.25-0.5 mg total) by mouth 3 (three) times daily as needed (Panic attacks).   escitalopram (LEXAPRO) 20 MG tablet Take 1 tablet (20 mg total) by mouth daily. (Patient taking differently: Take 20 mg by mouth daily as needed (anxiety).)   ibuprofen (ADVIL) 800 MG tablet Take 800 mg by mouth every 8 (eight) hours as needed for headache or mild pain.   prochlorperazine (COMPAZINE) 10 MG tablet Take 1 tablet (10 mg total) by mouth every 8 (eight) hours as needed for nausea or vomiting (headaches as well).   SUMAtriptan (IMITREX) 50 MG tablet Take 1 tablet (50 mg total) by mouth 4 (four) times daily as needed for migraine.   traZODone (DESYREL) 50 MG tablet Take 1 tablet (50 mg total) by mouth at bedtime.   valACYclovir (VALTREX) 1000 MG tablet Take 1 tablet (1,000 mg total) by mouth 3 (three) times daily.   No facility-administered medications prior to visit.     Objective:   BP (!) 114/50 (BP Location: Left Arm, Patient Position: Sitting, Cuff Size: Normal)    Pulse 66    Wt 130 lb 3.2 oz (59.1 kg)    LMP 04/29/2011    SpO2 100%    BMI 23.06 kg/m  Wt Readings from Last 3 Encounters:  06/09/21 130 lb 3.2 oz (59.1 kg)  04/26/21 126 lb 14.4 oz (57.6 kg)  04/25/21 126 lb 1.7 oz (57.2 kg)    BP Readings from Last 3 Encounters:  06/09/21 (!) 114/50  04/26/21 114/82  04/25/21 (!) 117/91   General: well  appearing, in no acute distress Psych: normal mood and affect. Less tearful in comparison to prior visits. No SI Skin: no rash or lesion on limited exam  Assessment & Plan:   Problem List Items Addressed This Visit       Other   GAD (generalized anxiety disorder) with panic attacks (Chronic)    She has been taking the Lexapro consistently now for the past 2 weeks.  She reports feeling slightly better and less tearful.  We discussed the recent passing of her dog that she has had for 9 years which has been difficult for her however she feels like she is coping with that better than she would have before she restarted the Lexapro.  Discussed that the Lexapro may still take a few more weeks before it becomes maximally effective. Sleeping has been more difficult for her since her dog died.  She reports using the trazodone on the days that she does not have to work but otherwise uses melatonin.  She does this because she feels like the trazodone still makes her somewhat sleepy in the morning. Plan: No changes in therapy today.  She will continue to current management with Lexapro 20 mg daily, as needed trazodone at night, as needed Xanax for panic attacks.  We did not  collect a UDS at today's visit but we will try to remember to get this done when she returns to the clinic in the future. No red flags or excessive requests for refills--seems to use appropriately.      Left-sided headache    She continues to have headaches but this seems to be less bothersome to her than it has been in the past.  No further work-up for change in management at this time.      Other Visit Diagnoses     Pneumococcal vaccination given    -  Primary   Relevant Orders   Pneumococcal conjugate vaccine 20-valent (Prevnar 20) (Completed)      Return in about 3 months (around 09/07/2021).   Pt discussed with Dr. Phyllis Ginger, MD Internal Medicine Resident PGY-3 [facility] Internal Medicine  Residency 06/09/2021 12:23 PM

## 2021-06-09 NOTE — Assessment & Plan Note (Signed)
She continues to have headaches but this seems to be less bothersome to her than it has been in the past.  No further work-up for change in management at this time.

## 2021-06-17 NOTE — Progress Notes (Signed)
Internal Medicine Clinic Attending  Case discussed with Dr. Christian  At the time of the visit.  We reviewed the resident's history and exam and pertinent patient test results.  I agree with the assessment, diagnosis, and plan of care documented in the resident's note.  

## 2021-06-20 ENCOUNTER — Telehealth: Payer: Self-pay | Admitting: Behavioral Health

## 2021-06-20 ENCOUNTER — Ambulatory Visit: Payer: No Typology Code available for payment source | Admitting: Behavioral Health

## 2021-06-20 NOTE — Telephone Encounter (Signed)
Pt reports she worked until 6:30am this morning & did not get home until 8:30am. Pt exhausted & woken up by Clinician today. Offered to r/s Pt so she can rest due to her currrent stressor levels w/caregiving, children & mother needing her attn. Pt agreed & thankful for the option.   Dr. Monna Fam

## 2021-06-27 NOTE — Progress Notes (Deleted)
NEUROLOGY CONSULTATION NOTE  Melissa Blankenship MRN: [id number] DOB: 10-10-1980  Referring provider: Vanetta Mulders, MD (ED referral) Primary care provider: Elige Radon, MD  Reason for consult:  headache  Assessment/Plan:   ***   Subjective:  Melissa Blankenship is a 41 year old female with depression and anxiety who presents for headaches.  History supplemented by ED notes.  History of migraines but started having new severe headaches in November requiring multiple ED visits.  MRI of brain, CTA head and neck and CTV of brain personally reviewed were all unremarkable.    04/21/2021 CT HEAD WO:  No acute intracranial process 04/23/2021 MRI BRAIN W WO:  Normal brain MRI.  No acute abnormality. 04/25/2021 CTA HEAD & NECK:  No large vessel occlusion, hemodynamically significant stenosis or evidence of dissection. 04/25/2021 CTV HEAD:  No evidence of dural sinus thrombosis.  Current NSAIDS/analgesics:  ibuprofen 800mg  Current triptans:  sumatriptan 50mg  Current ergotamine:  none Current anti-emetic:  prochlorperazine 10mg   Current muscle relaxants:  none Current Antihypertensive medications:  none Current Antidepressant medications:  escitalopram 20mg  PRN (anxiety), trazodone 50mg  QHS (insomnia) Current Anticonvulsant medications:  none Current anti-CGRP:  none Current Vitamins/Herbal/Supplements:  none Current Antihistamines/Decongestants:  none Other therapy:  *** Hormone/birth control:  none Other medications:  alprazolam  Past NSAIDS/analgesics:  meloxicam Past abortive triptans:  *** Past abortive ergotamine:  none Past muscle relaxants:  cyclobenzaprine Past anti-emetic:  ondansetron Past antihypertensive medications:  *** Past antidepressant medications:  *** Past anticonvulsant medications:  *** Past anti-CGRP:  none Past vitamins/Herbal/Supplements:  none Past antihistamines/decongestants:  none Other past therapies:  ***  Caffeine:  *** Alcohol:   *** Smoker:  *** Diet:  *** Exercise:  *** Depression:  ***; Anxiety:  *** Other pain:  *** Sleep hygiene:  *** Family history of headache:  ***      PAST MEDICAL HISTORY: Past Medical History:  Diagnosis Date   Anxiety    Depression    GERD (gastroesophageal reflux disease)    Thyroid nodule    Viral upper respiratory tract infection 07/02/2020    PAST SURGICAL HISTORY: Past Surgical History:  Procedure Laterality Date   ABDOMINAL HYSTERECTOMY     BUNIONECTOMY Left 06/22/2020   INCISION AND DRAINAGE / EXCISION THYROGLOSSAL CYST  2019   path report revealed hyperplastic thyroid tissue, non-malignant    TUBAL LIGATION      MEDICATIONS: Current Outpatient Medications on File Prior to Visit  Medication Sig Dispense Refill   albuterol (VENTOLIN HFA) 108 (90 Base) MCG/ACT inhaler Inhale 2 puffs into the lungs every 6 (six) hours as needed for wheezing or shortness of breath. 8 g 1   ALPRAZolam (XANAX) 0.25 MG tablet Take 1-2 tablets (0.25-0.5 mg total) by mouth 3 (three) times daily as needed (Panic attacks). 90 tablet 0   escitalopram (LEXAPRO) 20 MG tablet Take 1 tablet (20 mg total) by mouth daily. (Patient taking differently: Take 20 mg by mouth daily as needed (anxiety).) 30 tablet 3   ibuprofen (ADVIL) 800 MG tablet Take 800 mg by mouth every 8 (eight) hours as needed for headache or mild pain.     prochlorperazine (COMPAZINE) 10 MG tablet Take 1 tablet (10 mg total) by mouth every 8 (eight) hours as needed for nausea or vomiting (headaches as well). 16 tablet 0   SUMAtriptan (IMITREX) 50 MG tablet Take 1 tablet (50 mg total) by mouth 4 (four) times daily as needed for migraine. 40 tablet 6   traZODone (DESYREL) 50 MG  tablet Take 1 tablet (50 mg total) by mouth at bedtime. 90 tablet 1   valACYclovir (VALTREX) 1000 MG tablet Take 1 tablet (1,000 mg total) by mouth 3 (three) times daily. 21 tablet 0   No current facility-administered medications on file prior to visit.     ALLERGIES: No Known Allergies  FAMILY HISTORY: Family History  Problem Relation Age of Onset   Lung cancer Mother    Diabetes Mellitus II Mother    Hypertension Mother    Diabetes type II Father    Hypertension Father    CAD Maternal Grandfather     Objective:  *** General: No acute distress.  Patient appears well-groomed.   Head:  Normocephalic/atraumatic Eyes:  fundi examined but not visualized Neck: supple, no paraspinal tenderness, full range of motion Back: No paraspinal tenderness Heart: regular rate and rhythm Lungs: Clear to auscultation bilaterally. Vascular: No carotid bruits. Neurological Exam: Mental status: alert and oriented to person, place, and time, recent and remote memory intact, fund of knowledge intact, attention and concentration intact, speech fluent and not dysarthric, language intact. Cranial nerves: CN I: not tested CN II: pupils equal, round and reactive to light, visual fields intact CN III, IV, VI:  full range of motion, no nystagmus, no ptosis CN V: facial sensation intact. CN VII: upper and lower face symmetric CN VIII: hearing intact CN IX, X: gag intact, uvula midline CN XI: sternocleidomastoid and trapezius muscles intact CN XII: tongue midline Bulk & Tone: normal, no fasciculations. Motor:  muscle strength 5/5 throughout Sensation:  Pinprick, temperature and vibratory sensation intact. Deep Tendon Reflexes:  2+ throughout,  toes downgoing.   Finger to nose testing:  Without dysmetria.   Heel to shin:  Without dysmetria.   Gait:  Normal station and stride.  Romberg negative.    Thank you for allowing me to take part in the care of this patient.  Shon Millet, DO  CC: ***

## 2021-06-30 ENCOUNTER — Ambulatory Visit: Payer: No Typology Code available for payment source | Admitting: Neurology

## 2021-07-20 ENCOUNTER — Ambulatory Visit: Payer: No Typology Code available for payment source | Admitting: Neurology

## 2021-08-29 NOTE — Progress Notes (Deleted)
? ?NEUROLOGY CONSULTATION NOTE ? ?Melissa Blankenship ?MRN: [id number] ?DOB: 02-Oct-1980 ? ?Referring provider: Vanetta Mulders, MD (ED referral) ?Primary care provider: Elige Radon, MD ? ?Reason for consult:  migraines ? ?Assessment/Plan:  ? ?*** ? ? ?Subjective:  ?Melissa Blankenship is a 41 year old female with depression and GAD who presents for migraines.  History supplemented by ED and PCP notes.  CT head, MRI brain, CTV head and CTA head and neck personally reviewed. ? ? ?History of migraines but began having new headaches in November 2022.  ***.  She was seen in the ED several times that month.  She underwent extensive workup, including CT head, MRI of brain with and without contrast, CTV head and CTA head and neck which were all normal.  She was prescribed valacyclovir in case this represented possible shingles prior to onset of rash.  *** ? ?Typical migraines since ***.  Described as *** ? ?Current NSAIDS/analgesics:  ibuprofen 800mg  ?Current triptans:  sumatriptan 50mg  ?Current ergotamine:  none ?Current anti-emetic:  prochlorperazine 10mg  ?Current muscle relaxants:  none ?Current Antihypertensive medications:  none ?Current Antidepressant medications:  escitalopram 20mg  PRN, trazodone 50mg  QHS (insomnia) ?Current Anticonvulsant medications:  none ?Current anti-CGRP:  none ?Current Vitamins/Herbal/Supplements:  none ?Current Antihistamines/Decongestants:  none ?Other therapy:  *** ?Hormone/birth control:  none ?Other medications:  *** ? ?Past NSAIDS/analgesics:  meloxicam ?Past abortive triptans:  *** ?Past abortive ergotamine:  none ?Past muscle relaxants:  none ?Past anti-emetic:  ondansetron ?Past antihypertensive medications:  none ?Past antidepressant medications:  *** ?Past anticonvulsant medications:  none ?Past anti-CGRP:  none ?Past vitamins/Herbal/Supplements:  none ?Past antihistamines/decongestants:  none ?Other past therapies:  none ? ?Caffeine:  *** ?Alcohol:  *** ?Smoker:  *** ?Diet:   *** ?Exercise:  *** ?Depression:  ***; Anxiety:  *** ?Other pain:  *** ?Sleep hygiene:  *** ?Family history of headache:  *** ? ?  ? ? ?PAST MEDICAL HISTORY: ?Past Medical History:  ?Diagnosis Date  ? Anxiety   ? Depression   ? GERD (gastroesophageal reflux disease)   ? Thyroid nodule   ? Viral upper respiratory tract infection 07/02/2020  ? ? ?PAST SURGICAL HISTORY: ?Past Surgical History:  ?Procedure Laterality Date  ? ABDOMINAL HYSTERECTOMY    ? BUNIONECTOMY Left 06/22/2020  ? INCISION AND DRAINAGE / EXCISION THYROGLOSSAL CYST  2019  ? path report revealed hyperplastic thyroid tissue, non-malignant   ? TUBAL LIGATION    ? ? ?MEDICATIONS: ?Current Outpatient Medications on File Prior to Visit  ?Medication Sig Dispense Refill  ? albuterol (VENTOLIN HFA) 108 (90 Base) MCG/ACT inhaler Inhale 2 puffs into the lungs every 6 (six) hours as needed for wheezing or shortness of breath. 8 g 1  ? ALPRAZolam (XANAX) 0.25 MG tablet Take 1-2 tablets (0.25-0.5 mg total) by mouth 3 (three) times daily as needed (Panic attacks). 90 tablet 0  ? escitalopram (LEXAPRO) 20 MG tablet Take 1 tablet (20 mg total) by mouth daily. (Patient taking differently: Take 20 mg by mouth daily as needed (anxiety).) 30 tablet 3  ? ibuprofen (ADVIL) 800 MG tablet Take 800 mg by mouth every 8 (eight) hours as needed for headache or mild pain.    ? prochlorperazine (COMPAZINE) 10 MG tablet Take 1 tablet (10 mg total) by mouth every 8 (eight) hours as needed for nausea or vomiting (headaches as well). 16 tablet 0  ? SUMAtriptan (IMITREX) 50 MG tablet Take 1 tablet (50 mg total) by mouth 4 (four) times daily as needed for migraine. 40  tablet 6  ? traZODone (DESYREL) 50 MG tablet Take 1 tablet (50 mg total) by mouth at bedtime. 90 tablet 1  ? valACYclovir (VALTREX) 1000 MG tablet Take 1 tablet (1,000 mg total) by mouth 3 (three) times daily. 21 tablet 0  ? ?No current facility-administered medications on file prior to visit.  ? ? ?ALLERGIES: ?No Known  Allergies ? ?FAMILY HISTORY: ?Family History  ?Problem Relation Age of Onset  ? Lung cancer Mother   ? Diabetes Mellitus II Mother   ? Hypertension Mother   ? Diabetes type II Father   ? Hypertension Father   ? CAD Maternal Grandfather   ? ? ?Objective:  ?*** ?General: No acute distress.  Patient appears well-groomed.   ?Head:  Normocephalic/atraumatic ?Eyes:  fundi examined but not visualized ?Neck: supple, no paraspinal tenderness, full range of motion ?Back: No paraspinal tenderness ?Heart: regular rate and rhythm ?Lungs: Clear to auscultation bilaterally. ?Vascular: No carotid bruits. ?Neurological Exam: ?Mental status: alert and oriented to person, place, and time, recent and remote memory intact, fund of knowledge intact, attention and concentration intact, speech fluent and not dysarthric, language intact. ?Cranial nerves: ?CN I: not tested ?CN II: pupils equal, round and reactive to light, visual fields intact ?CN III, IV, VI:  full range of motion, no nystagmus, no ptosis ?CN V: facial sensation intact. ?CN VII: upper and lower face symmetric ?CN VIII: hearing intact ?CN IX, X: gag intact, uvula midline ?CN XI: sternocleidomastoid and trapezius muscles intact ?CN XII: tongue midline ?Bulk & Tone: normal, no fasciculations. ?Motor:  muscle strength 5/5 throughout ?Sensation:  Pinprick, temperature and vibratory sensation intact. ?Deep Tendon Reflexes:  2+ throughout,  toes downgoing.   ?Finger to nose testing:  Without dysmetria.   ?Heel to shin:  Without dysmetria.   ?Gait:  Normal station and stride.  Romberg negative. ? ? ? ?Thank you for allowing me to take part in the care of this patient. ? ?Shon Millet, DO ? ?CC: Elige Radon, MD ? ? ? ? ?

## 2021-08-30 ENCOUNTER — Encounter: Payer: Self-pay | Admitting: Neurology

## 2021-08-30 ENCOUNTER — Ambulatory Visit: Payer: No Typology Code available for payment source | Admitting: Neurology

## 2021-08-30 DIAGNOSIS — Z029 Encounter for administrative examinations, unspecified: Secondary | ICD-10-CM

## 2021-10-04 ENCOUNTER — Other Ambulatory Visit (HOSPITAL_COMMUNITY): Payer: Self-pay

## 2021-10-04 ENCOUNTER — Ambulatory Visit (INDEPENDENT_AMBULATORY_CARE_PROVIDER_SITE_OTHER): Payer: No Typology Code available for payment source | Admitting: Internal Medicine

## 2021-10-04 VITALS — BP 103/54 | HR 79 | Temp 98.1°F | Wt 132.6 lb

## 2021-10-04 DIAGNOSIS — F411 Generalized anxiety disorder: Secondary | ICD-10-CM

## 2021-10-04 DIAGNOSIS — J301 Allergic rhinitis due to pollen: Secondary | ICD-10-CM

## 2021-10-04 MED ORDER — NETI POT SINUS WASH 2300-700 MG NA KIT
1.0000 | PACK | Freq: Every day | NASAL | 0 refills | Status: DC | PRN
  Filled 2021-10-04: qty 1, fill #0

## 2021-10-04 MED ORDER — FLUTICASONE PROPIONATE 50 MCG/ACT NA SUSP
1.0000 | Freq: Every day | NASAL | 2 refills | Status: AC
  Filled 2021-10-04: qty 16, 60d supply, fill #0

## 2021-10-04 MED ORDER — CETIRIZINE HCL 10 MG PO TABS
10.0000 mg | ORAL_TABLET | Freq: Every day | ORAL | 2 refills | Status: DC
Start: 2021-10-04 — End: 2023-05-02
  Filled 2021-10-04: qty 30, 30d supply, fill #0

## 2021-10-04 NOTE — Progress Notes (Signed)
? ?  CC: stuffy nose ? ?HPI:[name] is a 41 y.o. female who presents for evaluation of stuffy nose. Please see individual problem based A/P for details. ? ? ?Past Medical History:  ?Diagnosis Date  ? Anxiety   ? Depression   ? GERD (gastroesophageal reflux disease)   ? Thyroid nodule   ? Viral upper respiratory tract infection 07/02/2020  ? ?Review of Systems:   ?Review of Systems  ?Constitutional:  Negative for chills and fever.  ?HENT:  Positive for congestion and sinus pain.    ? ?Physical Exam: ?Vitals:  ? 10/04/21 0834  ?BP: (!) 103/54  ?Pulse: 79  ?Temp: 98.1 ?F (36.7 ?C)  ?TempSrc: Oral  ?SpO2: 100%  ?Weight: 132 lb 9.6 oz (60.1 kg)  ? ? ? ?Physical Exam ?Constitutional:   ?   Appearance: She is well-developed and well-groomed.  ?HENT:  ?   Right Ear: Tympanic membrane and ear canal normal.  ?   Left Ear: Tympanic membrane and ear canal normal.  ?   Nose:  ?   Right Turbinates: Swollen.  ?   Left Turbinates: Swollen.  ?   Right Sinus: Maxillary sinus tenderness present.  ?   Left Sinus: Maxillary sinus tenderness present.  ?   Mouth/Throat:  ?   Mouth: Mucous membranes are moist.  ?   Pharynx: Posterior oropharyngeal erythema present. No oropharyngeal exudate or uvula swelling.  ?   Tonsils: No tonsillar exudate or tonsillar abscesses.  ?Eyes:  ?   Conjunctiva/sclera:  ?   Right eye: Right conjunctiva is not injected. No exudate. ?   Left eye: Left conjunctiva is not injected. No exudate. ?Cardiovascular:  ?   Rate and Rhythm: Normal rate and regular rhythm.  ?Pulmonary:  ?   Effort: Pulmonary effort is normal.  ?   Breath sounds: Normal breath sounds.  ?Lymphadenopathy:  ?   Cervical: No cervical adenopathy.  ? ? ? ?Assessment & Plan:  ? ?Allergic rhinitis ?Patient has had stuffy nose since Friday. Also have some post nasal drip. No sick contacts. No fever. Negative Covid test at home. Has history of seasonal allergies, but not currently on allergy medications. She has tried some nasal spray and  benadryl. Last night the stuffy nose was causing difficulty breathing and exacerbated her anxiety.  This was scary and patient made appointment to be evaluated. ? ?Assessment/Plan:  Seasonal allergic rhinitis due to pollen ?- fluticasone (FLONASE) 50 MCG/ACT nasal spray; Place 1 spray into both nostrils daily.  Dispense: 16 g; Refill: 2 ?- cetirizine (ZYRTEC ALLERGY) 10 MG tablet; Take 1 tablet (10 mg total) by mouth daily.  Dispense: 30 tablet; Refill: 2 ?- Sodium Chloride-Sodium Bicarb (NETI POT SINUS WASH) 2300-700 MG KIT; Place 1 kit into the nose daily as needed.  Dispense: 1 kit; Refill: 0 ? ? ? ?Patient discussed with Dr.  Cain Sieve ? ?

## 2021-10-04 NOTE — Patient Instructions (Addendum)
Thank you for trusting me with your care. To recap, today we discussed the following: ? ?GAD (generalized anxiety disorder) with panic attacks ?- ToxAssure Select,+Antidepr,UR ? ?Seasonal allergic rhinitis  ?- fluticasone (FLONASE) 50 MCG/ACT nasal spray; Place 1 spray into both nostrils daily.  Dispense: 15.8 mL; Refill: 2 ?- cetirizine (ZYRTEC ALLERGY) 10 MG tablet; Take 1 tablet (10 mg total) by mouth daily.  Dispense: 30 tablet; Refill: 2 ? ?

## 2021-10-05 ENCOUNTER — Ambulatory Visit: Payer: No Typology Code available for payment source | Admitting: Behavioral Health

## 2021-10-05 ENCOUNTER — Encounter: Payer: Self-pay | Admitting: Internal Medicine

## 2021-10-05 DIAGNOSIS — J309 Allergic rhinitis, unspecified: Secondary | ICD-10-CM | POA: Insufficient documentation

## 2021-10-05 NOTE — Assessment & Plan Note (Signed)
Patient has had stuffy nose since Friday. Also have some post nasal drip. No sick contacts. No fever. Negative Covid test at home. Has history of seasonal allergies, but not currently on allergy medications. She has tried some nasal spray and benadryl. Last night the stuffy nose was causing difficulty breathing and exacerbated her anxiety.  This was scary and patient made appointment to be evaluated. ? ?Assessment/Plan:  Seasonal allergic rhinitis due to pollen ?- fluticasone (FLONASE) 50 MCG/ACT nasal spray; Place 1 spray into both nostrils daily.  Dispense: 16 g; Refill: 2 ?- cetirizine (ZYRTEC ALLERGY) 10 MG tablet; Take 1 tablet (10 mg total) by mouth daily.  Dispense: 30 tablet; Refill: 2 ?- Sodium Chloride-Sodium Bicarb (NETI POT SINUS WASH) 2300-700 MG KIT; Place 1 kit into the nose daily as needed.  Dispense: 1 kit; Refill: 0 ?

## 2021-10-06 NOTE — Progress Notes (Signed)
Internal Medicine Clinic Attending  Case discussed with Dr. Steen  At the time of the visit.  We reviewed the resident's history and exam and pertinent patient test results.  I agree with the assessment, diagnosis, and plan of care documented in the resident's note.  

## 2021-10-10 LAB — TOXASSURE SELECT,+ANTIDEPR,UR

## 2021-10-20 ENCOUNTER — Encounter: Payer: No Typology Code available for payment source | Admitting: Internal Medicine

## 2021-10-26 ENCOUNTER — Encounter: Payer: No Typology Code available for payment source | Admitting: Internal Medicine

## 2021-10-27 ENCOUNTER — Encounter: Payer: Self-pay | Admitting: Internal Medicine

## 2021-10-27 ENCOUNTER — Ambulatory Visit (INDEPENDENT_AMBULATORY_CARE_PROVIDER_SITE_OTHER): Payer: No Typology Code available for payment source | Admitting: Internal Medicine

## 2021-10-27 ENCOUNTER — Ambulatory Visit: Payer: No Typology Code available for payment source | Admitting: Behavioral Health

## 2021-10-27 DIAGNOSIS — F331 Major depressive disorder, recurrent, moderate: Secondary | ICD-10-CM

## 2021-10-27 DIAGNOSIS — F17200 Nicotine dependence, unspecified, uncomplicated: Secondary | ICD-10-CM

## 2021-10-27 DIAGNOSIS — F411 Generalized anxiety disorder: Secondary | ICD-10-CM | POA: Diagnosis not present

## 2021-10-27 DIAGNOSIS — F17210 Nicotine dependence, cigarettes, uncomplicated: Secondary | ICD-10-CM | POA: Diagnosis not present

## 2021-10-27 NOTE — Assessment & Plan Note (Signed)
Still smoking intermittently but not every day.  Cessation encouraged

## 2021-10-27 NOTE — BH Specialist Note (Signed)
Integrated Behavioral Health via Telemedicine Visit  10/27/2021 Melissa Blankenship OR:[id number]  Number of [facility] Clinician visits: 7 Session Start time: 1300 Session End time: 1330 Total time in minutes: 30 min  Referring Provider: Dr. Verlin Dike, MD Patient/Family location: Pt is home in private [facility] Provider location: [facility] Office All persons participating in visit: Pt & Clinician Types of Service: Individual psychotherapy  I connected with Melissa Blankenship and/or Melissa Blankenship's  self  via  Animal nutritionist  (Video is Caregility application) and verified that I am speaking with the correct person using two identifiers. Discussed confidentiality: Yes   I discussed the limitations of telemedicine and the availability of in person appointments.  Discussed there is a possibility of technology failure and discussed alternative modes of communication if that failure occurs.  I discussed that engaging in this telemedicine visit, they consent to the provision of behavioral healthcare and the services will be billed under their insurance.  Patient and/or legal guardian expressed understanding and consented to Telemedicine visit: Yes   Presenting Concerns: Patient and/or family reports the following symptoms/concerns: elevated anx/dep since exp'g sinus issues that have caused her to have panic attacks when she cannot breathe or sleep Duration of problem: almost one month; Severity of problem: moderate  Patient and/or Family's Strengths/Protective Factors: Social and Emotional competence, Concrete supports in place (healthy food, safe environments, etc.), Sense of purpose, Caregiver has knowledge of parenting & child development, and Parental Resilience  Goals Addressed: Patient will:  Reduce symptoms of: anxiety, depression, and stress   Increase knowledge and/or ability of: coping skills, healthy habits, and stress reduction    Demonstrate ability to: Increase healthy adjustment to current life circumstances and Increase motivation to adhere to plan of care  Progress towards Goals: Ongoing  Interventions: Interventions utilized:  Solution-Focused Strategies, Mindfulness or Relaxation Training, Medication Monitoring, and Supportive Counseling Standardized Assessments completed:  screeners prn  Patient and/or Family Response: Pt receptive to call today & requests f/u visit  Assessment: Patient currently experiencing elevated anx Sx since she has been sick w/sinus issues.   Patient may benefit from cont'd Cslg to determine best alternatives for Tx of anx/dep.  Plan: Follow up with behavioral health clinician on : Next avail 30 min telehealth Behavioral recommendations: Pt given multiple Anti-anxiety applications for her phone, suggestions for addt'l breathing techniques & other ways to engage her Pre-frontal cortex on her phone using distraction w/games; Candy Crush, Tetras, etc. Referral(s): [facility] (In Clinic)  I discussed the assessment and treatment plan with the patient and/or parent/guardian. They were provided an opportunity to ask questions and all were answered. They agreed with the plan and demonstrated an understanding of the instructions.   They were advised to call back or seek an in-person evaluation if the symptoms worsen or if the condition fails to improve as anticipated.  Donnetta Hutching, LMFT

## 2021-10-27 NOTE — Progress Notes (Signed)
Office Visit   Patient ID: Melissa Blankenship, female    DOB: June 17, 1980, 41 y.o.   MRN: [id number]   PCP: Mitzi Hansen, MD   Subjective:   Melissa Blankenship is a 41 y.o. year old female who presents for follow up of GAD and panic disorder. Please refer to problem based charting for assessment and plan.   ACTIVE MEDICATIONS   Outpatient Medications Prior to Visit  Medication Sig   albuterol (VENTOLIN HFA) 108 (90 Base) MCG/ACT inhaler Inhale 2 puffs into the lungs every 6 (six) hours as needed for wheezing or shortness of breath.   ALPRAZolam (XANAX) 0.25 MG tablet Take 1-2 tablets (0.25-0.5 mg total) by mouth 3 (three) times daily as needed (Panic attacks).   cetirizine (ZYRTEC ALLERGY) 10 MG tablet Take 1 tablet (10 mg total) by mouth daily.   fluticasone (FLONASE) 50 MCG/ACT nasal spray Place 1 spray into both nostrils daily.   ibuprofen (ADVIL) 800 MG tablet Take 800 mg by mouth every 8 (eight) hours as needed for headache or mild pain.   SUMAtriptan (IMITREX) 50 MG tablet Take 1 tablet (50 mg total) by mouth 4 (four) times daily as needed for migraine.   traZODone (DESYREL) 50 MG tablet Take 1 tablet (50 mg total) by mouth at bedtime.   [DISCONTINUED] escitalopram (LEXAPRO) 20 MG tablet Take 1 tablet (20 mg total) by mouth daily. (Patient taking differently: Take 20 mg by mouth daily as needed (anxiety).)   [DISCONTINUED] prochlorperazine (COMPAZINE) 10 MG tablet Take 1 tablet (10 mg total) by mouth every 8 (eight) hours as needed for nausea or vomiting (headaches as well).   [DISCONTINUED] Sodium Chloride-Sodium Bicarb (NETI POT SINUS WASH) 2300-700 MG KIT Place 1 kit into the nose daily as needed.   [DISCONTINUED] valACYclovir (VALTREX) 1000 MG tablet Take 1 tablet (1,000 mg total) by mouth 3 (three) times daily.   No facility-administered medications prior to visit.     Objective:   BP 96/61 (BP Location: Left Arm, Patient Position: Sitting, Cuff Size: Normal)   Pulse 82    Temp 98.4 F (36.9 C) (Oral)   Wt 134 lb 14.4 oz (61.2 kg)   LMP 04/29/2011   SpO2 99%   BMI 23.90 kg/m  Wt Readings from Last 3 Encounters:  10/27/21 134 lb 14.4 oz (61.2 kg)  10/04/21 132 lb 9.6 oz (60.1 kg)  06/09/21 130 lb 3.2 oz (59.1 kg)    BP Readings from Last 3 Encounters:  10/27/21 96/61  10/04/21 (!) 103/54  06/09/21 (!) 114/50   General: Well-appearing female no acute distress Cardiac: Heart regular rate Psych: Normal mood and affect.  No SI.  Overall affect is improved from prior visits although she still does become teary-eyed when speaking about her mother's illnesses.   Assessment & Plan:   Problem List Items Addressed This Visit       Other   GAD (generalized anxiety disorder) with panic attacks (Chronic)    Current medications: Xanax 0.25 mg - 0.5 mg 3 times daily as needed for panic attack, Lexapro 20 mg daily (no longer taking) GAD symptoms are fairly well controlled at this time.  She is stopped taking Lexapro about a month ago- we have tried getting her on a baseline SSRI for a long time however she does not like to take medications daily.  Her GAD-7 score is 7 today.  She has not been experiencing overly frequent panic attacks.  She notes that her mother's health has been slightly  improving which she attributes to some improvement in her symptoms.  Her mom just recently had her PEG tube removed, it was placed over 3 years ago.  Her mom's nutrition status is going well.  She is becoming a little more ambulatory as well which Tiffanyann is happy about. PDMP reviewed.  Xanax last filled in December 2022.  Fill before that was in July 2022.  Last UDS was in April 2023 and was appropriately positive for Lexapro and Xanax - Continue as needed Xanax for panic attacks, will remove Lexapro from her medication list.  Continue as needed trazodone for sleep. - Continue counseling through [facility] paperwork will need to be renewed soon.  She will get this to  our office hopefully sometime later this month.       Tobacco use disorder    Still smoking intermittently but not every day.  Cessation encouraged         Return in about 6 months (around 04/29/2022).   Pt discussed with Dr. Venetia Maxon, MD Internal Medicine Resident PGY-3 [facility] 10/27/2021 12:18 PM

## 2021-10-27 NOTE — Assessment & Plan Note (Addendum)
Current medications: Xanax 0.25 mg - 0.5 mg 3 times daily as needed for panic attack, Lexapro 20 mg daily (no longer taking) GAD symptoms are fairly well controlled at this time.  She is stopped taking Lexapro about a month ago- we have tried getting her on a baseline SSRI for a long time however she does not like to take medications daily.  Her GAD-7 score is 7 today.  She has not been experiencing overly frequent panic attacks.  She notes that her mother's health has been slightly improving which she attributes to some improvement in her symptoms.  Her mom just recently had her PEG tube removed, it was placed over 3 years ago.  Her mom's nutrition status is going well.  She is becoming a little more ambulatory as well which Melissa Blankenship is happy about. PDMP reviewed.  Xanax last filled in December 2022.  Fill before that was in July 2022.  Last UDS was in April 2023 and was appropriately positive for Lexapro and Xanax - Continue as needed Xanax for panic attacks, will remove Lexapro from her medication list.  Continue as needed trazodone for sleep. - Continue counseling through [place] - FMLA paperwork will need to be renewed soon.  She will get this to our office hopefully sometime later this month.

## 2021-11-03 NOTE — Progress Notes (Signed)
Internal Medicine Clinic Attending  Case discussed with Dr. Christian  At the time of the visit.  We reviewed the resident's history and exam and pertinent patient test results.  I agree with the assessment, diagnosis, and plan of care documented in the resident's note.  

## 2021-11-23 ENCOUNTER — Ambulatory Visit: Payer: No Typology Code available for payment source | Admitting: Behavioral Health

## 2021-12-05 ENCOUNTER — Encounter: Payer: Self-pay | Admitting: *Deleted

## 2021-12-17 ENCOUNTER — Encounter: Payer: Self-pay | Admitting: Student

## 2021-12-20 ENCOUNTER — Telehealth: Payer: No Typology Code available for payment source | Admitting: Student

## 2021-12-23 ENCOUNTER — Telehealth: Payer: No Typology Code available for payment source | Admitting: Student

## 2022-01-03 ENCOUNTER — Ambulatory Visit (INDEPENDENT_AMBULATORY_CARE_PROVIDER_SITE_OTHER): Payer: No Typology Code available for payment source | Admitting: Student

## 2022-01-03 DIAGNOSIS — U071 COVID-19: Secondary | ICD-10-CM

## 2022-01-03 MED ORDER — BENZONATATE 100 MG PO CAPS: 100.0000 mg | ORAL_CAPSULE | Freq: Four times a day (QID) | ORAL | 1 refills | Status: AC | PRN

## 2022-01-03 NOTE — Assessment & Plan Note (Addendum)
Patient encounter is done over the phone. 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 Patient reports that there has been a little outbreak of COVID-19 in the inpatient rehab center since July 14th. She states that she was exposed to COVID on Friday, 07/21, and was tested the same day. She was never given the result of the test. Over the following weekend, she developed subjective fevers, chills, cough with phlegm, runny nose, diarrhea, loss of taste and smell, as well has rib pain. She states that her rib pain becomes worse with cough. She states that she went to work last night on 07/24 and ended up testing positive for COVID-19 and had to go home. Patent does have a history of asthma but denies any shortness of breath.  Plan: -conservative management with staying well hydrated -patient does not meet the requirements for paxlovid treatment  -supply benzonatate for cough

## 2022-01-03 NOTE — Progress Notes (Signed)
   CC: Cough  This is a telephone encounter between [employer] and [employer] on 01/03/2022 for Cough. The visit was conducted with the patient located at home and Modena Slater at [facility]. The patient's identity was confirmed using their DOB and current address. The patient has consented to being evaluated through a telephone encounter and understands the associated risks (an examination cannot be done and the patient may need to come in for an appointment) / benefits (allows the patient to remain at home, decreasing exposure to coronavirus). I personally spent 20 minutes on medical discussion.   HPI:  Melissa Blankenship is a 41 y.o. with PMH as below.   Please see A&P for assessment of the patient's acute and chronic medical conditions.   Past Medical History:  Diagnosis Date   Anxiety    Bunion 05/14/2020   Depression    GERD (gastroesophageal reflux disease)    Thyroid nodule    Viral upper respiratory tract infection 07/02/2020   Review of Systems:    General: Patient endorses chills and subjective fevers  ENT: Patient endorses runny nose, but denies any sore throat  Respiratory: Patient endorses cough but denies any shortness of breath     Assessment & Plan:   COVID-19 Patient encounter is done over the phone. 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 Patient reports that there has been a little outbreak of COVID-19 in the inpatient rehab center since July 14th. She states that she was exposed to COVID on Friday, 07/21, and was tested the same day. She was never given the result of the test. Over the following weekend, she developed subjective fevers, chills, cough with phlegm, runny nose, diarrhea, loss of taste and smell, as well has rib pain. She states that her rib pain becomes worse with cough. She states that she went to work last night on 07/24 and ended up testing positive for COVID-19 and had to go home. Patent does have a history of  asthma but denies any shortness of breath.  Plan: -conservative management with staying well hydrated -patient does not meet the requirements for paxlovid treatment  -supply benzonatate for cough    Patient seen with Dr. Alphonsus Sias, DO  Internal Medicine Resident

## 2022-01-09 NOTE — Progress Notes (Signed)
Internal Medicine Clinic Attending  Case discussed with Dr. Patel  at the time of the visit.  We reviewed the resident's history and pertinent patient test results.  I agree with the assessment, diagnosis, and plan of care documented in the resident's note.  

## 2022-02-15 ENCOUNTER — Other Ambulatory Visit: Payer: Self-pay | Admitting: Internal Medicine

## 2022-02-15 DIAGNOSIS — Z1231 Encounter for screening mammogram for malignant neoplasm of breast: Secondary | ICD-10-CM

## 2022-02-28 ENCOUNTER — Encounter (HOSPITAL_BASED_OUTPATIENT_CLINIC_OR_DEPARTMENT_OTHER): Payer: Self-pay

## 2022-02-28 ENCOUNTER — Other Ambulatory Visit: Payer: Self-pay

## 2022-02-28 ENCOUNTER — Emergency Department (HOSPITAL_BASED_OUTPATIENT_CLINIC_OR_DEPARTMENT_OTHER)
Admission: EM | Admit: 2022-02-28 | Discharge: 2022-02-28 | Disposition: A | Discharge status: Home / Self Care | Payer: No Typology Code available for payment source | Attending: Emergency Medicine | Admitting: Emergency Medicine

## 2022-02-28 DIAGNOSIS — M545 Low back pain, unspecified: Secondary | ICD-10-CM | POA: Insufficient documentation

## 2022-02-28 DIAGNOSIS — M5442 Lumbago with sciatica, left side: Secondary | ICD-10-CM

## 2022-02-28 LAB — URINALYSIS, ROUTINE W REFLEX MICROSCOPIC
Bilirubin Urine: NEGATIVE
Glucose, UA: NEGATIVE mg/dL
Hgb urine dipstick: NEGATIVE
Ketones, ur: NEGATIVE mg/dL
Leukocytes,Ua: NEGATIVE
Nitrite: NEGATIVE
Protein, ur: NEGATIVE mg/dL
Specific Gravity, Urine: 1.011 (ref 1.005–1.030)
pH: 7 (ref 5.0–8.0)

## 2022-02-28 LAB — PREGNANCY, URINE: Preg Test, Ur: NEGATIVE

## 2022-02-28 MED ORDER — NAPROXEN 375 MG PO TABS
375.0000 mg | ORAL_TABLET | Freq: Two times a day (BID) | ORAL | 0 refills | Status: DC
Start: 2022-02-28 — End: 2023-05-02
  Filled 2022-02-28: qty 20, 10d supply, fill #0

## 2022-02-28 MED ORDER — KETOROLAC TROMETHAMINE 60 MG/2ML IM SOLN
60.0000 mg | Freq: Once | INTRAMUSCULAR | Status: AC
Start: 2022-02-28 — End: 2022-02-28
  Administered 2022-02-28: 60 mg via INTRAMUSCULAR
  Filled 2022-02-28: qty 2

## 2022-02-28 MED ORDER — METHOCARBAMOL 500 MG PO TABS
500.0000 mg | ORAL_TABLET | Freq: Two times a day (BID) | ORAL | 0 refills | Status: DC
  Filled 2022-02-28: qty 20, 10d supply, fill #0

## 2022-02-28 NOTE — ED Provider Notes (Signed)
[facility] EMERGENCY DEPT Provider Note   CSN: [id number] Arrival date & time: 02/28/22  1844     History  Chief Complaint  Patient presents with   Back Pain    Melissa Blankenship is a 41 y.o. female.  Patient presents with complaint of low back pain radiating into buttocks and thighs. Recurrent back pain, but current episode started yesterday while at work. No known injury or precipitating event. Denies abdominal pain, urinary or fecal incontinence. No prior back surgery. She does endorse some urinary frequency. No fever, chills, nausea, vomiting.  The history is provided by the patient.  Back Pain Location:  Lumbar spine Quality:  Shooting Radiates to:  R thigh Pain severity:  Severe Pain is:  Same all the time Onset quality:  Sudden Duration:  1 day Timing:  Constant Progression:  Unchanged Chronicity:  Recurrent Context: not lifting heavy objects, not occupational injury, not recent illness and not recent injury   Associated symptoms: leg pain   Associated symptoms: no abdominal pain, no bladder incontinence, no bowel incontinence, no fever, no numbness, no pelvic pain, no perianal numbness and no weakness        Home Medications Prior to Admission medications   Medication Sig Start Date End Date Taking? Authorizing Provider  albuterol (VENTOLIN HFA) 108 (90 Base) MCG/ACT inhaler Inhale 2 puffs into the lungs every 6 (six) hours as needed for wheezing or shortness of breath. 01/09/19   Mitzi Hansen, MD  ALPRAZolam Duanne Moron) 0.25 MG tablet Take 1-2 tablets (0.25-0.5 mg total) by mouth 3 (three) times daily as needed (Panic attacks). 05/18/21   Aldine Contes, MD  benzonatate (TESSALON PERLES) 100 MG capsule Take 1 capsule (100 mg total) by mouth every 6 (six) hours as needed for cough. 01/03/22 01/03/23  Leigh Aurora, DO  cetirizine (ZYRTEC ALLERGY) 10 MG tablet Take 1 tablet (10 mg total) by mouth daily. 10/04/21 10/04/22  Lyndal Pulley, MD  fluticasone  (FLONASE) 50 MCG/ACT nasal spray Place 1 spray into both nostrils daily. 10/04/21   Lyndal Pulley, MD  ibuprofen (ADVIL) 800 MG tablet Take 800 mg by mouth every 8 (eight) hours as needed for headache or mild pain.    [provider]  SUMAtriptan (IMITREX) 50 MG tablet Take 1 tablet (50 mg total) by mouth 4 (four) times daily as needed for migraine. 04/26/21   Orvis Brill, MD  traZODone (DESYREL) 50 MG tablet Take 1 tablet (50 mg total) by mouth at bedtime. 05/18/21   Mitzi Hansen, MD      Allergies    Patient has no known allergies.    Review of Systems   Review of Systems  Constitutional:  Negative for fever.  Gastrointestinal:  Negative for abdominal pain, bowel incontinence, diarrhea, nausea and vomiting.  Genitourinary:  Positive for frequency. Negative for bladder incontinence and pelvic pain.  Musculoskeletal:  Positive for back pain.  Neurological:  Negative for weakness and numbness.  All other systems reviewed and are negative.   Physical Exam Updated Vital Signs BP (!) 121/91 (BP Location: Right Arm)   Pulse 90   Temp 98.3 F (36.8 C) (Oral)   Resp 18   Ht 5\' 3"  (1.6 m)   Wt 62.6 kg   LMP 04/29/2011   SpO2 100%   BMI 24.45 kg/m  Physical Exam Vitals and nursing note reviewed.  Constitutional:      Appearance: Normal appearance.  HENT:     Head: Normocephalic.     Nose: Nose  normal.     Mouth/Throat:     Mouth: Mucous membranes are moist.  Eyes:     Conjunctiva/sclera: Conjunctivae normal.  Cardiovascular:     Rate and Rhythm: Normal rate and regular rhythm.  Pulmonary:     Effort: Pulmonary effort is normal.  Abdominal:     Palpations: Abdomen is soft.  Musculoskeletal:        General: Tenderness present. No swelling, deformity or signs of injury. Normal range of motion.     Cervical back: Normal range of motion.  Skin:    General: Skin is warm and dry.  Neurological:     Mental Status: She is alert and oriented to person, place,  and time.     Sensory: No sensory deficit.     Motor: No weakness.     Gait: Gait normal.  Psychiatric:        Mood and Affect: Mood normal.        Behavior: Behavior normal.     ED Results / Procedures / Treatments   Labs (all labs ordered are listed, but only abnormal results are displayed) Labs Reviewed  PREGNANCY, URINE  URINALYSIS, ROUTINE W REFLEX MICROSCOPIC    EKG None  Radiology No results found.  Procedures Procedures    Medications Ordered in ED Medications  ketorolac (TORADOL) injection 60 mg (60 mg Intramuscular Given 02/28/22 2109)    ED Course/ Medical Decision Making/ A&P                           Medical Decision Making Amount and/or Complexity of Data Reviewed Labs: ordered.  Risk Prescription drug management.   Patient with back pain.  No neurological deficits and normal neuro exam.  Patient is ambulatory.  No loss of bowel or bladder control.  No concern for cauda equina.  No fever, night sweats, weight loss, h/o cancer, IVDA, no recent procedure to back. No urinary symptoms suggestive of UTI.  Supportive care and return precaution discussed. Appears safe for discharge at this time. Follow up as indicated in discharge paperwork.          Final Clinical Impression(s) / ED Diagnoses Final diagnoses:  Acute bilateral low back pain with bilateral sciatica    Rx / DC Orders ED Discharge Orders          Ordered    naproxen (NAPROSYN) 375 MG tablet  2 times daily        02/28/22 2154    methocarbamol (ROBAXIN) 500 MG tablet  2 times daily        02/28/22 2154              Etta Quill, NP 02/28/22 2157    Gareth Morgan, MD 03/01/22 1024

## 2022-02-28 NOTE — ED Triage Notes (Signed)
Pt states started back has been hurting for awhile. States back hurts in lumbar and pain radiates down right side of leg. States pain is 10/10. Took ibuprofen and state pain is still bad. Denies any constipation. State she has been going to the restroom more frequently. Denies V/N.

## 2022-02-28 NOTE — Discharge Instructions (Addendum)
Please refer to the attached instructions. Take medication as directed. Follow up with your care provider at the internal medicine clinic.

## 2022-03-01 ENCOUNTER — Other Ambulatory Visit (HOSPITAL_COMMUNITY): Payer: Self-pay

## 2022-03-16 ENCOUNTER — Encounter: Payer: No Typology Code available for payment source | Admitting: Student

## 2022-03-20 ENCOUNTER — Ambulatory Visit
Admission: RE | Admit: 2022-03-20 | Discharge: 2022-03-20 | Disposition: A | Discharge status: Home / Self Care | Payer: No Typology Code available for payment source | Source: Ambulatory Visit | Attending: Internal Medicine | Admitting: Internal Medicine

## 2022-03-20 DIAGNOSIS — Z1231 Encounter for screening mammogram for malignant neoplasm of breast: Secondary | ICD-10-CM

## 2022-03-27 ENCOUNTER — Encounter: Payer: No Typology Code available for payment source | Admitting: Student

## 2022-03-27 ENCOUNTER — Encounter: Payer: Self-pay | Admitting: Student

## 2022-03-27 NOTE — Progress Notes (Deleted)
02/28/2022 ED

## 2023-02-05 ENCOUNTER — Other Ambulatory Visit: Payer: Self-pay | Admitting: Family Medicine

## 2023-02-05 DIAGNOSIS — Z1231 Encounter for screening mammogram for malignant neoplasm of breast: Secondary | ICD-10-CM

## 2023-02-09 ENCOUNTER — Ambulatory Visit: Payer: 59 | Admitting: Family Medicine

## 2023-02-09 ENCOUNTER — Encounter: Payer: Self-pay | Admitting: Family Medicine

## 2023-02-09 ENCOUNTER — Other Ambulatory Visit (HOSPITAL_COMMUNITY): Payer: Self-pay

## 2023-02-09 VITALS — BP 98/70 | HR 72 | Temp 98.0°F | Ht 63.5 in | Wt 133.8 lb

## 2023-02-09 DIAGNOSIS — J302 Other seasonal allergic rhinitis: Secondary | ICD-10-CM

## 2023-02-09 DIAGNOSIS — K219 Gastro-esophageal reflux disease without esophagitis: Secondary | ICD-10-CM

## 2023-02-09 DIAGNOSIS — Z8711 Personal history of peptic ulcer disease: Secondary | ICD-10-CM

## 2023-02-09 DIAGNOSIS — J029 Acute pharyngitis, unspecified: Secondary | ICD-10-CM | POA: Diagnosis not present

## 2023-02-09 DIAGNOSIS — Z7689 Persons encountering health services in other specified circumstances: Secondary | ICD-10-CM

## 2023-02-09 DIAGNOSIS — H6992 Unspecified Eustachian tube disorder, left ear: Secondary | ICD-10-CM

## 2023-02-09 DIAGNOSIS — G4709 Other insomnia: Secondary | ICD-10-CM

## 2023-02-09 DIAGNOSIS — F419 Anxiety disorder, unspecified: Secondary | ICD-10-CM | POA: Insufficient documentation

## 2023-02-09 LAB — POC COVID19 BINAXNOW: SARS Coronavirus 2 Ag: NEGATIVE

## 2023-02-09 LAB — POCT INFLUENZA A/B
Influenza A, POC: NEGATIVE
Influenza B, POC: NEGATIVE

## 2023-02-09 MED ORDER — ESCITALOPRAM OXALATE 10 MG PO TABS
10.0000 mg | ORAL_TABLET | Freq: Every day | ORAL | 0 refills | Status: DC
  Filled 2023-02-09: qty 90, 90d supply, fill #0

## 2023-02-09 NOTE — Progress Notes (Signed)
Established Patient Office Visit   Subjective  Patient ID: Melissa Blankenship, female    DOB: 10/04/1980  Age: 42 y.o. MRN: [id number]  Chief Complaint  Patient presents with   Establish Care    Wants to discuss her anxiety and stress.  Flu shot.      Patient is a 42 year old female previously seen by Lucille Passy, DO at [facility] internal med.  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  Patient states she developed postnasal drainage, sore throat, itching in ears 6 days ago.  She has been taking home remedies and drinking hot tea with to help with symptoms.  Anxiety/panic attacks: Patient states she feels like symptoms are increasing.  Previously started on Lexapro and Xanax.  Patient states she could not get a refill as she has not seen a provider in over a year.  Patient also notes she does not like taking medication.  States was not taking Lexapro consistently.  Patient states anxiety may have started around the time her mother was diagnosed with cancer a few years ago.  Since then patient notes increased anxiety/panic when taking her mother to appointments, at work, and when trying to sleep.  Patient was previously in counseling however her counselor retired.  GERD: Was taking a PPI, omeprazole but it did not seem to be helping.  Using over-the-counter medications.  May have symptoms 1-2 times per month.  Acidic foods cause symptoms.  Unsure of other items that cause symptoms.  Does not eat spicy foods due to history of stomach ulcer.  States takes Aleve or ibuprofen for headaches.  Headache/migraines: Previously on Imitrex.  Currently may take Aleve or ibuprofen.  Insomnia: Patient notes mind racing when trying to go to sleep.  Currently working nights.  Goes to work at 6 PM and gets off at 8 AM.  Patient states she is typically up by noon helping care for her mother.  Allergies: NKDA  Social history: Patient is single.   She moved to the area from [place].  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  Patient has 2 adult children.  Smoker.  Patient denies alcohol and tobacco use.  Health maintenance: Hysterectomy 2004.  No recent Pap. Mammogram 03/20/2022.  Has one coming up. Last physical January 2023.  Family medical history: Arthritis, asthma, cancer, COPD, HTN MGM-arthritis, asthma, HTN MGF-MI, heart disease, HLD, HTN     Past Medical History:  Diagnosis Date   Anxiety    Bunion 05/14/2020   Depression    GERD (gastroesophageal reflux disease)    Thyroid nodule    Viral upper respiratory tract infection 07/02/2020   Past Surgical History:  Procedure Laterality Date   ABDOMINAL HYSTERECTOMY     BUNIONECTOMY Left 06/22/2020   INCISION AND DRAINAGE / EXCISION THYROGLOSSAL CYST  2019   path report revealed hyperplastic thyroid tissue, non-malignant    TUBAL LIGATION     Social History   Tobacco Use   Smoking status: Some Days    Current packs/day: 0.10    Types: Cigarettes   Smokeless tobacco: Never   Tobacco comments:    1 pk per month   Vaping Use   Vaping status: Never Used  Substance Use Topics   Alcohol use: Yes    Comment: Social   Drug use: No   Family History  Problem Relation Age of Onset   Lung cancer Mother    Diabetes Mellitus II Mother  Hypertension Mother    Diabetes type II Father    Hypertension Father    CAD Maternal Grandfather    No Known Allergies    ROS Negative unless stated above    Objective:     BP 98/70 (BP Location: Left Arm, Patient Position: Sitting, Cuff Size: Normal)   Pulse 72   Temp 98 F (36.7 C) (Oral)   Ht 5' 3.5" (1.613 m)   Wt 133 lb 12.8 oz (60.7 kg)   LMP 04/29/2011   SpO2 94%   BMI 23.33 kg/m    Physical Exam Constitutional:      General: She is not in acute distress.    Appearance: Normal appearance.  HENT:     Head: Normocephalic and atraumatic.     Nose: Nose normal.     Mouth/Throat:     Mouth:  Mucous membranes are moist.  Cardiovascular:     Rate and Rhythm: Normal rate and regular rhythm.     Heart sounds: Normal heart sounds. No murmur heard.    No gallop.  Pulmonary:     Effort: Pulmonary effort is normal. No respiratory distress.     Breath sounds: Normal breath sounds. No wheezing, rhonchi or rales.  Skin:    General: Skin is warm and dry.  Neurological:     Mental Status: She is alert and oriented to person, place, and time.      No results found for any visits on 02/09/23.    02/09/2023    8:20 AM 10/27/2021    9:09 AM 10/04/2021    8:37 AM  Depression screen PHQ 2/9  Decreased Interest 1 1 0  Down, Depressed, Hopeless  1 0  PHQ - 2 Score 1 2 0  Altered sleeping 1 1   Tired, decreased energy 1 1   Change in appetite 1 1   Feeling bad or failure about yourself  1 0   Trouble concentrating 0 1   Moving slowly or fidgety/restless 0 1   Suicidal thoughts 0 0   PHQ-9 Score 5 7   Difficult doing work/chores Somewhat difficult Somewhat difficult       02/09/2023    8:21 AM 10/27/2021    9:10 AM 04/26/2021    8:47 AM 04/05/2020   10:36 AM  GAD 7 : Generalized Anxiety Score  Nervous, Anxious, on Edge 1 1 1 3   Control/stop worrying 1 1 1 3   Worry too much - different things 1 1 1 3   Trouble relaxing 1 1 1 3   Restless 0 1 0 3  Easily annoyed or irritable 1 1 1 3   Afraid - awful might happen 1 1 1 3   Total GAD 7 Score 6 7 6 21   Anxiety Difficulty Somewhat difficult Somewhat difficult Somewhat difficult Somewhat difficult       Assessment & Plan:  Anxiety -PHQ-9 score 5 -GAD-7 score 6 -Though GAD-7 score relatively low patient endorses panic attacks and increased symptoms.  Discussed medication options and counseling. -Patient will try to be consistent with Lexapro daily.  Advised likely take several weeks before she notices improvement in symptoms. -Discussed trying to benzos as can be habit-forming. -Given counseling information.  Advised to  reconsider. -Self-care. -     Escitalopram Oxalate; Take 1 tablet (10 mg total) by mouth daily.  Dispense: 90 tablet; Refill: 0  Sore throat -     POCT Influenza A/B -     POC COVID-19 BinaxNow  Gastroesophageal reflux disease, unspecified whether esophagitis  present -Avoidance of foods known to cause GERD symptoms. -OTC antiacid as needed -For increased symptoms follow-up with GI  History of gastric ulcer -Advised to avoid NSAIDs  Encounter to establish care -We reviewed the PMH, PSH, FH, SH, Meds and Allergies. -We provided refills for any medications we will prescribe as needed. -We addressed current concerns per orders and patient instructions. -We have asked for records for pertinent exams, studies, vaccines and notes from previous providers. -We have advised patient to follow up per instructions below.  Dysfunction of left eustachian tube -Likely 2/2 seasonal allergies -Consistent OTC antihistamine use encouraged. -Also consider nasal spray such as Flonase or saline nasal rinse.  Can also use local honey.  Seasonal allergies -Antihistamine as needed  Other insomnia -Likely multifactorial including anxiety and shift work affecting sleep. -Discussed sleep hygiene briefly.  Discussed further at upcoming follow-up appointment.   Return in about 5 weeks (around 03/16/2023).  Can schedule CPE at her convenience.  Deeann Saint, MD

## 2023-02-09 NOTE — Patient Instructions (Addendum)
It was nice meeting you today.  You can set up a follow-up appointment in the next few weeks to see how things are going in for your physical.  The Lexapro will likely take 4-6 weeks before you notice its full effect at this dose.   Behavioral Health Services: -to make an appointment contact the office/provider you are interested in seeing.  No referral is needed.  The below is not an all inclusive list, but will help you get started.  [email or web address] -counseling located off of [address]  [email or web address] -website helps you find providers in your area  [facility] -Located off of [address]. across from [address] [place] [address]  Dr. Jannifer Franklin is a Therapist, sports with [facility]. [phone]  [facility] Counseling and wellness  [facility]  -[address]  [phone] -a place in town that has counseling and Psychiatry services.

## 2023-02-21 ENCOUNTER — Other Ambulatory Visit (HOSPITAL_COMMUNITY): Payer: Self-pay

## 2023-03-19 ENCOUNTER — Ambulatory Visit: Payer: 59 | Admitting: Family Medicine

## 2023-03-22 ENCOUNTER — Ambulatory Visit
Admission: RE | Admit: 2023-03-22 | Discharge: 2023-03-22 | Disposition: A | Discharge status: Home / Self Care | Payer: 59 | Source: Ambulatory Visit | Attending: Family Medicine | Admitting: Family Medicine

## 2023-03-22 DIAGNOSIS — Z1231 Encounter for screening mammogram for malignant neoplasm of breast: Secondary | ICD-10-CM | POA: Diagnosis not present

## 2023-03-26 ENCOUNTER — Other Ambulatory Visit (HOSPITAL_COMMUNITY): Payer: Self-pay

## 2023-03-26 ENCOUNTER — Ambulatory Visit: Payer: 59 | Admitting: Family Medicine

## 2023-03-26 ENCOUNTER — Encounter: Payer: Self-pay | Admitting: Family Medicine

## 2023-03-26 VITALS — BP 110/70 | HR 81 | Temp 98.4°F | Ht 63.5 in | Wt 131.4 lb

## 2023-03-26 DIAGNOSIS — F411 Generalized anxiety disorder: Secondary | ICD-10-CM

## 2023-03-26 DIAGNOSIS — G4709 Other insomnia: Secondary | ICD-10-CM | POA: Diagnosis not present

## 2023-03-26 MED ORDER — ALPRAZOLAM 0.25 MG PO TABS
0.2500 mg | ORAL_TABLET | Freq: Three times a day (TID) | ORAL | 1 refills | Status: DC | PRN
Start: 2023-03-26 — End: 2024-03-06
  Filled 2023-03-26: qty 90, 15d supply, fill #0

## 2023-03-26 MED ORDER — TRAZODONE HCL 50 MG PO TABS
50.0000 mg | ORAL_TABLET | Freq: Every day | ORAL | 1 refills | Status: AC
  Filled 2023-03-26: qty 90, 90d supply, fill #0

## 2023-03-26 NOTE — Progress Notes (Signed)
Established Patient Office Visit   Subjective  Patient ID: Melissa Blankenship, female    DOB: December 21, 1980  Age: 42 y.o. MRN: [id number]  Chief Complaint  Patient presents with   Anxiety    Patient states the Lexapro makes her feel "funny",     Patient is a 42 year old female seen for follow-up.  Patient tried Lexapro 10 mg daily to help with anxiety but states the medication began making her feel nauseous and have headaches.  In the past medication did not make patient feel right but she was not taking it consistently.  Patient also taking Xanax as needed.  Anxiety worse when taking her mother to her doctors appointments.  Patient endorses difficulty sleeping.  Working second shift, 7 PM-7 AM.  Typically wakes up midday and has difficulty falling back asleep.  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  Previously on trazodone but could not get med refill by previous provider.  Patient is in counseling which seems to be going okay.    Patient Active Problem List   Diagnosis Date Noted   Anxiety 02/09/2023   History of gastric ulcer 02/09/2023   COVID-19 01/03/2022   Allergic rhinitis 10/05/2021   Tobacco use disorder 02/10/2021   Health care maintenance 06/01/2020   GAD (generalized anxiety disorder) with panic attacks 01/09/2019   Asthma 01/09/2019   Seasonal allergies 01/09/2019   Gastroesophageal reflux disease 12/12/2018   Past Medical History:  Diagnosis Date   Anxiety    Bunion 05/14/2020   Depression    GERD (gastroesophageal reflux disease)    Thyroid nodule    Viral upper respiratory tract infection 07/02/2020   Past Surgical History:  Procedure Laterality Date   ABDOMINAL HYSTERECTOMY     BUNIONECTOMY Left 06/22/2020   INCISION AND DRAINAGE / EXCISION THYROGLOSSAL CYST  2019   path report revealed hyperplastic thyroid tissue, non-malignant    TUBAL LIGATION     Social History   Tobacco Use   Smoking status: Some Days    Current  packs/day: 0.10    Types: Cigarettes   Smokeless tobacco: Never   Tobacco comments:    1 pk per month   Vaping Use   Vaping status: Never Used  Substance Use Topics   Alcohol use: Yes    Comment: Social   Drug use: No   Family History  Problem Relation Age of Onset   Lung cancer Mother    Diabetes Mellitus II Mother    Hypertension Mother    Diabetes type II Father    Hypertension Father    CAD Maternal Grandfather    No Known Allergies    ROS Negative unless stated above    Objective:     BP 110/70 (BP Location: Left Arm, Patient Position: Sitting, Cuff Size: Normal)   Pulse 81   Temp 98.4 F (36.9 C) (Oral)   Ht 5' 3.5" (1.613 m)   Wt 131 lb 6.4 oz (59.6 kg)   LMP 04/29/2011   SpO2 97%   BMI 22.91 kg/m  BP Readings from Last 3 Encounters:  03/26/23 110/70  02/09/23 98/70  02/28/22 122/77   Wt Readings from Last 3 Encounters:  03/26/23 131 lb 6.4 oz (59.6 kg)  02/09/23 133 lb 12.8 oz (60.7 kg)  02/28/22 138 lb (62.6 kg)      Physical Exam Constitutional:      Appearance: Normal appearance.  HENT:     Head: Normocephalic and atraumatic.  Mouth/Throat:     Mouth: Mucous membranes are moist.  Eyes:     Extraocular Movements: Extraocular movements intact.     Conjunctiva/sclera: Conjunctivae normal.  Cardiovascular:     Rate and Rhythm: Normal rate.  Pulmonary:     Effort: Pulmonary effort is normal.  Skin:    General: Skin is warm and dry.  Neurological:     Mental Status: She is alert and oriented to person, place, and time. Mental status is at baseline.  Psychiatric:        Mood and Affect: Mood normal.        Behavior: Behavior normal.        Thought Content: Thought content normal.       02/09/2023    8:20 AM 10/27/2021    9:09 AM 10/04/2021    8:37 AM  Depression screen PHQ 2/9  Decreased Interest 1 1 0  Down, Depressed, Hopeless  1 0  PHQ - 2 Score 1 2 0  Altered sleeping 1 1   Tired, decreased energy 1 1   Change in appetite  1 1   Feeling bad or failure about yourself  1 0   Trouble concentrating 0 1   Moving slowly or fidgety/restless 0 1   Suicidal thoughts 0 0   PHQ-9 Score 5 7   Difficult doing work/chores Somewhat difficult Somewhat difficult       02/09/2023    8:21 AM 10/27/2021    9:10 AM 04/26/2021    8:47 AM 04/05/2020   10:36 AM  GAD 7 : Generalized Anxiety Score  Nervous, Anxious, on Edge 1 1 1 3   Control/stop worrying 1 1 1 3   Worry too much - different things 1 1 1 3   Trouble relaxing 1 1 1 3   Restless 0 1 0 3  Easily annoyed or irritable 1 1 1 3   Afraid - awful might happen 1 1 1 3   Total GAD 7 Score 6 7 6 21   Anxiety Difficulty Somewhat difficult Somewhat difficult Somewhat difficult Somewhat difficult      No results found for any visits on 03/26/23.    Assessment & Plan:  GAD (generalized anxiety disorder) with panic attacks -GAD-7 score 6 this visit -PHQ-9 score 5 -Will discontinue Lexapro 10 mg daily due to nausea, headaches, patient preference. -Consider other medication options for worsened symptoms. -Continue Xanax 0.25-5 mg as needed -Continue counseling.  Congratulated on improvement.  Patient given goals. -     ALPRAZolam; Take 1-2 tablets (0.25-0.5 mg total) by mouth 3 (three) times daily as needed (Panic attacks).  Dispense: 90 tablet; Refill: 1  Other insomnia -likely multifactorial including shift work and increased anxiety -Sleep hygiene -Restart trazodone -     traZODone HCl; Take 1 tablet (50 mg total) by mouth at bedtime.  Dispense: 90 tablet; Refill: 1    Return in about 3 months (around 06/26/2023), or Sooner for CPE.   Deeann Saint, MD

## 2023-03-27 ENCOUNTER — Other Ambulatory Visit: Payer: Self-pay | Admitting: Family Medicine

## 2023-03-27 DIAGNOSIS — R928 Other abnormal and inconclusive findings on diagnostic imaging of breast: Secondary | ICD-10-CM

## 2023-04-06 ENCOUNTER — Encounter: Payer: Self-pay | Admitting: Family Medicine

## 2023-04-10 ENCOUNTER — Ambulatory Visit
Admission: RE | Admit: 2023-04-10 | Discharge: 2023-04-10 | Disposition: A | Discharge status: Home / Self Care | Payer: 59 | Source: Ambulatory Visit | Attending: Family Medicine | Admitting: Family Medicine

## 2023-04-10 ENCOUNTER — Ambulatory Visit
Admission: RE | Admit: 2023-04-10 | Discharge: 2023-04-10 | Disposition: A | Discharge status: Home / Self Care | Payer: 59 | Source: Ambulatory Visit | Attending: Family Medicine

## 2023-04-10 DIAGNOSIS — R928 Other abnormal and inconclusive findings on diagnostic imaging of breast: Secondary | ICD-10-CM

## 2023-04-10 DIAGNOSIS — N6002 Solitary cyst of left breast: Secondary | ICD-10-CM | POA: Diagnosis not present

## 2023-04-19 NOTE — Telephone Encounter (Signed)
Letter provided and printed.

## 2023-04-19 NOTE — Telephone Encounter (Signed)
Called patient left a VM letter in in the front waiting for pick-up

## 2023-05-02 ENCOUNTER — Encounter: Payer: Self-pay | Admitting: Family Medicine

## 2023-05-02 ENCOUNTER — Ambulatory Visit: Payer: 59 | Admitting: Family Medicine

## 2023-05-02 ENCOUNTER — Other Ambulatory Visit (HOSPITAL_COMMUNITY): Payer: Self-pay

## 2023-05-02 VITALS — BP 98/70 | HR 78 | Temp 98.5°F | Ht 63.78 in | Wt 135.6 lb

## 2023-05-02 DIAGNOSIS — Z0000 Encounter for general adult medical examination without abnormal findings: Secondary | ICD-10-CM | POA: Diagnosis not present

## 2023-05-02 DIAGNOSIS — B379 Candidiasis, unspecified: Secondary | ICD-10-CM

## 2023-05-02 DIAGNOSIS — R5383 Other fatigue: Secondary | ICD-10-CM | POA: Diagnosis not present

## 2023-05-02 DIAGNOSIS — T3695XA Adverse effect of unspecified systemic antibiotic, initial encounter: Secondary | ICD-10-CM | POA: Diagnosis not present

## 2023-05-02 DIAGNOSIS — F411 Generalized anxiety disorder: Secondary | ICD-10-CM | POA: Diagnosis not present

## 2023-05-02 DIAGNOSIS — J0140 Acute pansinusitis, unspecified: Secondary | ICD-10-CM | POA: Diagnosis not present

## 2023-05-02 LAB — T4, FREE: Free T4: 0.83 ng/dL (ref 0.60–1.60)

## 2023-05-02 LAB — COMPREHENSIVE METABOLIC PANEL
ALT: 13 U/L (ref 0–35)
AST: 14 U/L (ref 0–37)
Albumin: 4.2 g/dL (ref 3.5–5.2)
Alkaline Phosphatase: 91 U/L (ref 39–117)
BUN: 9 mg/dL (ref 6–23)
CO2: 29 meq/L (ref 19–32)
Calcium: 9.2 mg/dL (ref 8.4–10.5)
Chloride: 107 meq/L (ref 96–112)
Creatinine, Ser: 0.73 mg/dL (ref 0.40–1.20)
GFR: 100.29 mL/min (ref >60.00)
Glucose, Bld: 86 mg/dL (ref 70–99)
Potassium: 4 meq/L (ref 3.5–5.1)
Sodium: 139 meq/L (ref 135–145)
Total Bilirubin: 0.2 mg/dL (ref 0.2–1.2)
Total Protein: 6.7 g/dL (ref 6.0–8.3)

## 2023-05-02 LAB — CBC WITH DIFFERENTIAL/PLATELET
Basophils Absolute: 0 10*3/uL (ref 0.0–0.1)
Basophils Relative: 0.8 % (ref 0.0–3.0)
Eosinophils Absolute: 0.2 10*3/uL (ref 0.0–0.7)
Eosinophils Relative: 3.9 % (ref 0.0–5.0)
HCT: 40.9 % (ref 36.0–46.0)
Hemoglobin: 13.3 g/dL (ref 12.0–15.0)
Lymphocytes Relative: 52 % — ABNORMAL HIGH (ref 12.0–46.0)
Lymphs Abs: 3.1 10*3/uL (ref 0.7–4.0)
MCHC: 32.5 g/dL (ref 30.0–36.0)
MCV: 90.7 fL (ref 78.0–100.0)
Monocytes Absolute: 0.5 10*3/uL (ref 0.1–1.0)
Monocytes Relative: 8.9 % (ref 3.0–12.0)
Neutro Abs: 2.1 10*3/uL (ref 1.4–7.7)
Neutrophils Relative %: 34.4 % — ABNORMAL LOW (ref 43.0–77.0)
Platelets: 182 10*3/uL (ref 150.0–400.0)
RBC: 4.51 Mil/uL (ref 3.87–5.11)
RDW: 13.6 % (ref 11.5–15.5)
WBC: 6 10*3/uL (ref 4.0–10.5)

## 2023-05-02 LAB — VITAMIN B12: Vitamin B-12: 136 pg/mL — ABNORMAL LOW (ref 211–911)

## 2023-05-02 LAB — LIPID PANEL
Cholesterol: 132 mg/dL (ref 0–200)
HDL: 37.8 mg/dL — ABNORMAL LOW (ref >39.00)
LDL Cholesterol: 73 mg/dL (ref 0–99)
NonHDL: 94.63
Total CHOL/HDL Ratio: 4
Triglycerides: 109 mg/dL (ref 0.0–149.0)
VLDL: 21.8 mg/dL (ref 0.0–40.0)

## 2023-05-02 LAB — VITAMIN D 25 HYDROXY (VIT D DEFICIENCY, FRACTURES): VITD: 8.37 ng/mL — ABNORMAL LOW (ref 30.00–100.00)

## 2023-05-02 LAB — TSH: TSH: 0.37 u[IU]/mL (ref 0.35–5.50)

## 2023-05-02 LAB — HEMOGLOBIN A1C: Hgb A1c MFr Bld: 5.8 % (ref 4.6–6.5)

## 2023-05-02 MED ORDER — AMOXICILLIN-POT CLAVULANATE 500-125 MG PO TABS
1.0000 | ORAL_TABLET | Freq: Two times a day (BID) | ORAL | 0 refills | Status: AC
Start: 2023-05-02 — End: 2023-05-10
  Filled 2023-05-02: qty 14, 7d supply, fill #0

## 2023-05-02 MED ORDER — FLUCONAZOLE 150 MG PO TABS
ORAL_TABLET | ORAL | 0 refills | Status: DC
Start: 2023-05-02 — End: 2023-06-02
  Filled 2023-05-02: qty 2, 2d supply, fill #0

## 2023-05-02 NOTE — Progress Notes (Signed)
Established Patient Office Visit   Subjective  Patient ID: Melissa Blankenship, female    DOB: 10/16/1980  Age: 42 y.o. MRN: [id number]  Chief Complaint  Patient presents with   Annual Exam    Pt is a 42 yo female seen for CPE and acute concern.  Pt endorses facial pain and pressure x 1 week.  Patient also notes discomfort in left lateral neck, ear feeling muffled/uncomfortable x 2 weeks.  Patient started taking Zyrtec for symptoms without relief.  Patient endorses continued fatigue/tiredness.  Work last night.  Also notes GI distress after eating soup from [employer] around midnight.    Patient Active Problem List   Diagnosis Date Noted   Anxiety 02/09/2023   History of gastric ulcer 02/09/2023   COVID-19 01/03/2022   Allergic rhinitis 10/05/2021   Tobacco use disorder 02/10/2021   Health care maintenance 06/01/2020   GAD (generalized anxiety disorder) with panic attacks 01/09/2019   Asthma 01/09/2019   Seasonal allergies 01/09/2019   Gastroesophageal reflux disease 12/12/2018   Past Medical History:  Diagnosis Date   Anxiety    Bunion 05/14/2020   Depression    GERD (gastroesophageal reflux disease)    Thyroid nodule    Viral upper respiratory tract infection 07/02/2020   Past Surgical History:  Procedure Laterality Date   ABDOMINAL HYSTERECTOMY     BUNIONECTOMY Left 06/22/2020   INCISION AND DRAINAGE / EXCISION THYROGLOSSAL CYST  2019   path report revealed hyperplastic thyroid tissue, non-malignant    TUBAL LIGATION     Social History   Tobacco Use   Smoking status: Some Days    Current packs/day: 0.10    Types: Cigarettes   Smokeless tobacco: Never   Tobacco comments:    1 pk per month   Vaping Use   Vaping status: Never Used  Substance Use Topics   Alcohol use: Yes    Comment: Social   Drug use: No   Family History  Problem Relation Age of Onset   Lung cancer Mother    Diabetes Mellitus II Mother    Hypertension Mother    Diabetes type II Father     Hypertension Father    CAD Maternal Grandfather    No Known Allergies    ROS Negative unless stated above    Objective:     BP 98/70 (BP Location: Left Arm, Patient Position: Sitting, Cuff Size: Normal)   Pulse 78   Temp 98.5 F (36.9 C) (Oral)   Ht 5' 3.78" (1.62 m)   Wt 135 lb 9.6 oz (61.5 kg)   LMP 04/29/2011   SpO2 98%   BMI 23.44 kg/m  BP Readings from Last 3 Encounters:  05/02/23 98/70  03/26/23 110/70  02/09/23 98/70   Wt Readings from Last 3 Encounters:  05/02/23 135 lb 9.6 oz (61.5 kg)  03/26/23 131 lb 6.4 oz (59.6 kg)  02/09/23 133 lb 12.8 oz (60.7 kg)      Physical Exam Constitutional:      Appearance: Normal appearance.  HENT:     Head: Normocephalic and atraumatic.     Right Ear: Tympanic membrane, ear canal and external ear normal.     Left Ear: Tympanic membrane, ear canal and external ear normal.     Ears:     Comments: Left TM full slightly cloudy in appearance, no erythema.  Right TM clear, full, cone of light present.  TTP of left lateral neck.    Nose:     Right  Sinus: Maxillary sinus tenderness and frontal sinus tenderness present.     Left Sinus: Maxillary sinus tenderness and frontal sinus tenderness present.     Mouth/Throat:     Mouth: Mucous membranes are moist.     Pharynx: No oropharyngeal exudate or posterior oropharyngeal erythema.  Eyes:     General: No scleral icterus.    Extraocular Movements: Extraocular movements intact.     Conjunctiva/sclera: Conjunctivae normal.     Pupils: Pupils are equal, round, and reactive to light.  Neck:     Thyroid: No thyromegaly.  Cardiovascular:     Rate and Rhythm: Normal rate and regular rhythm.     Pulses: Normal pulses.     Heart sounds: Normal heart sounds. No murmur heard.    No friction rub.  Pulmonary:     Effort: Pulmonary effort is normal.     Breath sounds: Normal breath sounds. No wheezing, rhonchi or rales.  Abdominal:     General: Bowel sounds are normal.      Palpations: Abdomen is soft.     Tenderness: There is no abdominal tenderness.  Musculoskeletal:        General: No deformity. Normal range of motion.  Lymphadenopathy:     Cervical: No cervical adenopathy.  Skin:    General: Skin is warm and dry.     Findings: No lesion.  Neurological:     General: No focal deficit present.     Mental Status: She is alert and oriented to person, place, and time.  Psychiatric:        Mood and Affect: Mood normal.        Thought Content: Thought content normal.       03/26/2023    1:23 PM 02/09/2023    8:20 AM 10/27/2021    9:09 AM  Depression screen PHQ 2/9  Decreased Interest 1 1 1   Down, Depressed, Hopeless 1  1  PHQ - 2 Score 2 1 2   Altered sleeping 2 1 1   Tired, decreased energy 2 1 1   Change in appetite 1 1 1   Feeling bad or failure about yourself  0 1 0  Trouble concentrating 0 0 1  Moving slowly or fidgety/restless 0 0 1  Suicidal thoughts 0 0 0  PHQ-9 Score 7 5 7   Difficult doing work/chores Somewhat difficult Somewhat difficult Somewhat difficult      03/26/2023    1:23 PM 02/09/2023    8:21 AM 10/27/2021    9:10 AM 04/26/2021    8:47 AM  GAD 7 : Generalized Anxiety Score  Nervous, Anxious, on Edge 1 1 1 1   Control/stop worrying 1 1 1 1   Worry too much - different things 1 1 1 1   Trouble relaxing 1 1 1 1   Restless 0 0 1 0  Easily annoyed or irritable 1 1 1 1   Afraid - awful might happen 0 1 1 1   Total GAD 7 Score 5 6 7 6   Anxiety Difficulty Somewhat difficult Somewhat difficult Somewhat difficult Somewhat difficult     No results found for any visits on 05/02/23.    Assessment & Plan:  Well adult exam -Age-appropriate health screenings discussed -Will obtain lab -Immunizations reviewed -Mammogram up-to-date done 03/22/2023 -Pap not indicated 2/2 history of hysterectomy in 2004 -Colonoscopy due next year -Next CPE in 1 year -     CBC with Differential/Platelet; Future -     Comprehensive metabolic panel;  Future -     Hemoglobin A1c; Future -  Lipid panel; Future -     TSH; Future -     Vitamin B12; Future -     VITAMIN D 25 Hydroxy (Vit-D Deficiency, Fractures); Future  GAD (generalized anxiety disorder) with panic attacks -Stable -Continue Xanax as needed and counseling -Lexapro previously d/c'd 2/2 nausea and headaches -     TSH; Future -     T4, free; Future  Acute pansinusitis, recurrence not specified -     Amoxicillin-Pot Clavulanate; Take 1 tablet by mouth 2 (two) times daily for 7 days.  Dispense: 14 tablet; Refill: 0  Antibiotic-induced yeast infection -     Fluconazole; Take one tab now.  Repeat dose in 3 days if needed.  Dispense: 2 tablet; Refill: 0  Other fatigue -     CBC with Differential/Platelet; Future -     Hemoglobin A1c; Future -     TSH; Future -     Vitamin B12; Future -     VITAMIN D 25 Hydroxy (Vit-D Deficiency, Fractures); Future -     T4, free; Future   Return if symptoms worsen or fail to improve.   Deeann Saint, MD

## 2023-05-16 ENCOUNTER — Other Ambulatory Visit: Payer: Self-pay | Admitting: Family Medicine

## 2023-05-16 ENCOUNTER — Other Ambulatory Visit (HOSPITAL_COMMUNITY): Payer: Self-pay

## 2023-05-16 DIAGNOSIS — E559 Vitamin D deficiency, unspecified: Secondary | ICD-10-CM | POA: Insufficient documentation

## 2023-05-16 DIAGNOSIS — E538 Deficiency of other specified B group vitamins: Secondary | ICD-10-CM | POA: Insufficient documentation

## 2023-05-16 MED ORDER — VITAMIN D (ERGOCALCIFEROL) 1.25 MG (50000 UNIT) PO CAPS
50000.0000 [IU] | ORAL_CAPSULE | ORAL | 0 refills | Status: DC
Start: 2023-05-16 — End: 2023-11-30
  Filled 2023-05-16: qty 12, 84d supply, fill #0

## 2023-05-17 ENCOUNTER — Ambulatory Visit (INDEPENDENT_AMBULATORY_CARE_PROVIDER_SITE_OTHER): Payer: 59

## 2023-05-17 DIAGNOSIS — E538 Deficiency of other specified B group vitamins: Secondary | ICD-10-CM

## 2023-05-17 MED ORDER — CYANOCOBALAMIN 1000 MCG/ML IJ SOLN
1000.0000 ug | Freq: Once | INTRAMUSCULAR | Status: AC
Start: 2023-05-17 — End: 2023-05-17
  Administered 2023-05-17: 1000 ug via INTRAMUSCULAR

## 2023-05-17 NOTE — Progress Notes (Signed)
 Per orders of Dr. Salomon Fick, injection of B12 given by Vickii Chafe on Right Deltoid. Patient tolerated injection well.

## 2023-06-02 ENCOUNTER — Emergency Department (HOSPITAL_COMMUNITY)
Admission: EM | Admit: 2023-06-02 | Discharge: 2023-06-02 | Disposition: A | Discharge status: Home / Self Care | Payer: 59 | Attending: Emergency Medicine | Admitting: Emergency Medicine

## 2023-06-02 ENCOUNTER — Ambulatory Visit
Admission: RE | Admit: 2023-06-02 | Discharge: 2023-06-02 | Disposition: A | Discharge status: Other Acute Inpatient Hospital | Payer: 59 | Source: Ambulatory Visit | Attending: Family Medicine | Admitting: Family Medicine

## 2023-06-02 ENCOUNTER — Other Ambulatory Visit: Payer: Self-pay

## 2023-06-02 ENCOUNTER — Encounter (HOSPITAL_COMMUNITY): Payer: 59

## 2023-06-02 ENCOUNTER — Ambulatory Visit (HOSPITAL_COMMUNITY): Admission: EM | Admit: 2023-06-02 | Payer: 59 | Source: Ambulatory Visit

## 2023-06-02 ENCOUNTER — Encounter (HOSPITAL_COMMUNITY): Payer: Self-pay

## 2023-06-02 ENCOUNTER — Emergency Department (HOSPITAL_BASED_OUTPATIENT_CLINIC_OR_DEPARTMENT_OTHER): Payer: 59

## 2023-06-02 VITALS — BP 113/80 | HR 79 | Temp 98.3°F | Resp 16

## 2023-06-02 DIAGNOSIS — G629 Polyneuropathy, unspecified: Secondary | ICD-10-CM | POA: Insufficient documentation

## 2023-06-02 DIAGNOSIS — M7989 Other specified soft tissue disorders: Secondary | ICD-10-CM | POA: Diagnosis not present

## 2023-06-02 DIAGNOSIS — R2241 Localized swelling, mass and lump, right lower limb: Secondary | ICD-10-CM | POA: Diagnosis present

## 2023-06-02 DIAGNOSIS — M79604 Pain in right leg: Secondary | ICD-10-CM | POA: Insufficient documentation

## 2023-06-02 DIAGNOSIS — M79661 Pain in right lower leg: Secondary | ICD-10-CM | POA: Diagnosis not present

## 2023-06-02 LAB — I-STAT CHEM 8, ED
BUN: 6 mg/dL (ref 6–20)
Calcium, Ion: 1.15 mmol/L (ref 1.15–1.40)
Chloride: 106 mmol/L (ref 98–111)
Creatinine, Ser: 0.8 mg/dL (ref 0.44–1.00)
Glucose, Bld: 88 mg/dL (ref 70–99)
HCT: 43 % (ref 36.0–46.0)
Hemoglobin: 14.6 g/dL (ref 12.0–15.0)
Potassium: 3.6 mmol/L (ref 3.5–5.1)
Sodium: 142 mmol/L (ref 135–145)
TCO2: 22 mmol/L (ref 22–32)

## 2023-06-02 NOTE — ED Triage Notes (Addendum)
Right thigh and calf pain, has noticed broken blood vessels to thigh - noticed this Thursday.  Pain is burning, throbbing. Has noticed right thigh swelling. Has never had dvt or clotting issues. Has taken tylenol.

## 2023-06-02 NOTE — Discharge Instructions (Addendum)
The only place we can get an ultrasound done on Saturday is [facility].  Present directly to the radiology department.  Stay at the hospital after the test in case additional medical care is needed.  You will be called with your test result.

## 2023-06-02 NOTE — ED Provider Notes (Signed)
[place] EMERGENCY DEPARTMENT AT [facility] Provider Note   CSN: [id number] Arrival date & time: 06/02/23  1208     History {Add pertinent medical, surgical, social history, OB history to HPI:1} Chief Complaint  Patient presents with   Leg Pain    Melissa Blankenship is a 42 y.o. female.  HPI     Pt comes in with cc of   Home Medications Prior to Admission medications   Medication Sig Start Date End Date Taking? Authorizing Provider  albuterol (VENTOLIN HFA) 108 (90 Base) MCG/ACT inhaler Inhale 2 puffs into the lungs every 6 (six) hours as needed for wheezing or shortness of breath. 01/09/19   Elige Radon, MD  ALPRAZolam Prudy Feeler) 0.25 MG tablet Take 1-2 tablets (0.25-0.5 mg total) by mouth 3 (three) times daily as needed (Panic attacks). 03/26/23   Deeann Saint, MD  fluticasone (FLONASE) 50 MCG/ACT nasal spray Place 1 spray into both nostrils daily. 10/04/21   Gardenia Phlegm, MD  omeprazole (PRILOSEC) 40 MG capsule Take by mouth. 07/16/17   [provider]  traZODone (DESYREL) 50 MG tablet Take 1 tablet (50 mg total) by mouth at bedtime. 03/26/23   Deeann Saint, MD  Vitamin D, Ergocalciferol, (DRISDOL) 1.25 MG (50000 UNIT) CAPS capsule Take 1 capsule (50,000 Units total) by mouth every 7 (seven) days. 05/16/23   Deeann Saint, MD      Allergies    Patient has no known allergies.    Review of Systems   Review of Systems  Physical Exam Updated Vital Signs BP 117/89   Pulse 75   Temp 98.3 F (36.8 C)   Resp 16   LMP 04/29/2011   SpO2 98%  Physical Exam  ED Results / Procedures / Treatments   Labs (all labs ordered are listed, but only abnormal results are displayed) Labs Reviewed  I-STAT CHEM 8, ED    EKG None  Radiology VAS [place] LOWER EXTREMITY VENOUS (DVT) (7a-7p) Result Date: 06/02/2023  Lower Venous DVT Study Patient Name:  Melissa Blankenship  Date of Exam:   06/02/2023 Medical Rec #: [id number]          Accession #:     [id number] Date of Birth: 06-21-1980          Patient Gender: F Patient Age:   5 years Exam Location:  [facility] Procedure:      VAS [place] LOWER EXTREMITY VENOUS (DVT) Referring Phys: Fayrene Helper --------------------------------------------------------------------------------  Indications: Swelling, and Edema.  Risk Factors: Past pregnancy. Comparison Study: None Performing Technologist: Shona Simpson  Examination Guidelines: A complete evaluation includes B-mode imaging, spectral Doppler, color Doppler, and power Doppler as needed of all accessible portions of each vessel. Bilateral testing is considered an integral part of a complete examination. Limited examinations for reoccurring indications may be performed as noted. The reflux portion of the exam is performed with the patient in reverse Trendelenburg.  +---------+---------------+---------+-----------+----------+--------------+ RIGHT    CompressibilityPhasicitySpontaneityPropertiesThrombus Aging +---------+---------------+---------+-----------+----------+--------------+ CFV      Full           Yes      Yes                                 +---------+---------------+---------+-----------+----------+--------------+ SFJ      Full                                                        +---------+---------------+---------+-----------+----------+--------------+  FV Prox  Full                                                        +---------+---------------+---------+-----------+----------+--------------+ FV Mid   Full                                                        +---------+---------------+---------+-----------+----------+--------------+ FV DistalFull                                                        +---------+---------------+---------+-----------+----------+--------------+ PFV      Full                                                         +---------+---------------+---------+-----------+----------+--------------+ POP      Full           Yes      Yes                                 +---------+---------------+---------+-----------+----------+--------------+ PTV      Full                                                        +---------+---------------+---------+-----------+----------+--------------+ PERO     Full                                                        +---------+---------------+---------+-----------+----------+--------------+   +----+---------------+---------+-----------+----------+--------------+ LEFTCompressibilityPhasicitySpontaneityPropertiesThrombus Aging +----+---------------+---------+-----------+----------+--------------+ CFV Full           Yes      Yes                                 +----+---------------+---------+-----------+----------+--------------+     Summary: RIGHT: - There is no evidence of deep vein thrombosis in the lower extremity.  - No cystic structure found in the popliteal fossa.  LEFT: - No evidence of common femoral vein obstruction.   *See table(s) above for measurements and observations.    Preliminary     Procedures Procedures  {Document cardiac monitor, telemetry assessment procedure when appropriate:1}  Medications Ordered in ED Medications - No data to display  ED Course/ Medical Decision Making/ A&P   {   Click here for ABCD2, HEART and other calculatorsREFRESH Note before signing :1}  Medical Decision Making  ***  {Document critical care time when appropriate:1} {Document review of labs and clinical decision tools ie heart score, Chads2Vasc2 etc:1}  {Document your independent review of radiology images, and any outside records:1} {Document your discussion with family members, caretakers, and with consultants:1} {Document social determinants of health affecting pt's care:1} {Document your decision making why or why not  admission, treatments were needed:1} Final Clinical Impression(s) / ED Diagnoses Final diagnoses:  Right leg pain  Neuropathy    Rx / DC Orders ED Discharge Orders     None

## 2023-06-02 NOTE — ED Notes (Signed)
Patient is being sent to [facility] for ultrasound. Patient notified by Dr. Delton See to stay there until she is called with results. Patient verbalizes understanding. Patient going by pov.

## 2023-06-02 NOTE — ED Provider Triage Note (Signed)
Emergency Medicine Provider Triage Evaluation Note  Melissa Blankenship , a 42 y.o. female  was evaluated in triage.  Pt complains of leg pain. Endorse pain and swelling to RLE on going x 2 days.  No trauma, no fever, chills, cp, sob.  No hx of PE/DVT  Review of Systems  Positive: As above Negative: As above  Physical Exam  BP 117/89   Pulse 75   Temp 98.3 F (36.8 C)   Resp 16   LMP 04/29/2011   SpO2 98%  Gen:   Awake, no distress   Resp:  Normal effort  MSK:   Moves extremities without difficulty  Other:    Medical Decision Making  Medically screening exam initiated at 12:25 PM.  Appropriate orders placed.  Melissa Blankenship was informed that the remainder of the evaluation will be completed by another provider, this initial triage assessment does not replace that evaluation, and the importance of remaining in the ED until their evaluation is complete.     Fayrene Helper, PA-C 06/02/23 1229

## 2023-06-02 NOTE — Progress Notes (Signed)
Lower extremity venous duplex completed. Please see CV Procedures for preliminary results.  Shona Simpson, RVT 06/02/23 1:56 PM

## 2023-06-02 NOTE — ED Provider Notes (Signed)
[facility]    CSN: [id number] Arrival date & time: 06/02/23  1023      History   Chief Complaint Chief Complaint  Patient presents with   Leg Pain    HPI Melissa Blankenship is a 42 y.o. female.   HPI patient states that she had Pain a couple of days ago.  L calf pain is improving but she has pain in her upper leg.  She states that her thigh is swollen and has a burning pain.  Her whole  right leg is more swollen than the left.  She has never had a DVT or bleeding problem.  Nothing in her family.  She has been taking Tylenol for the pain.  She is a smoker.  She is not on estrogen.  She has had an abdominal hysterectomy.  Never had malignancy  Past Medical History:  Diagnosis Date   Anxiety    Bunion 05/14/2020   Depression    GERD (gastroesophageal reflux disease)    Thyroid nodule    Viral upper respiratory tract infection 07/02/2020    Patient Active Problem List   Diagnosis Date Noted   Vitamin D deficiency 05/16/2023   Vitamin B12 deficiency 05/16/2023   Anxiety 02/09/2023   History of gastric ulcer 02/09/2023   COVID-19 01/03/2022   Allergic rhinitis 10/05/2021   Tobacco use disorder 02/10/2021   Health care maintenance 06/01/2020   GAD (generalized anxiety disorder) with panic attacks 01/09/2019   Asthma 01/09/2019   Seasonal allergies 01/09/2019   Gastroesophageal reflux disease 12/12/2018    Past Surgical History:  Procedure Laterality Date   ABDOMINAL HYSTERECTOMY     BUNIONECTOMY Left 06/22/2020   INCISION AND DRAINAGE / EXCISION THYROGLOSSAL CYST  2019   path report revealed hyperplastic thyroid tissue, non-malignant    TUBAL LIGATION      OB History   No obstetric history on file.      Home Medications    Prior to Admission medications   Medication Sig Start Date End Date Taking? Authorizing Provider  albuterol (VENTOLIN HFA) 108 (90 Base) MCG/ACT inhaler Inhale 2 puffs into the lungs every 6 (six) hours as needed for wheezing or  shortness of breath. 01/09/19   Elige Radon, MD  ALPRAZolam Prudy Feeler) 0.25 MG tablet Take 1-2 tablets (0.25-0.5 mg total) by mouth 3 (three) times daily as needed (Panic attacks). 03/26/23   Deeann Saint, MD  fluticasone (FLONASE) 50 MCG/ACT nasal spray Place 1 spray into both nostrils daily. 10/04/21   Gardenia Phlegm, MD  omeprazole (PRILOSEC) 40 MG capsule Take by mouth. 07/16/17   [provider]  traZODone (DESYREL) 50 MG tablet Take 1 tablet (50 mg total) by mouth at bedtime. 03/26/23   Deeann Saint, MD  Vitamin D, Ergocalciferol, (DRISDOL) 1.25 MG (50000 UNIT) CAPS capsule Take 1 capsule (50,000 Units total) by mouth every 7 (seven) days. 05/16/23   Deeann Saint, MD    Family History Family History  Problem Relation Age of Onset   Lung cancer Mother    Diabetes Mellitus II Mother    Hypertension Mother    Diabetes type II Father    Hypertension Father    CAD Maternal Grandfather     Social History Social History   Tobacco Use   Smoking status: Some Days    Current packs/day: 0.10    Types: Cigarettes   Smokeless tobacco: Never   Tobacco comments:    1 pk per month   Vaping Use  Vaping status: Never Used  Substance Use Topics   Alcohol use: Yes    Comment: Social   Drug use: No     Allergies   Patient has no known allergies.   Review of Systems Review of Systems See HPI  Physical Exam Triage Vital Signs ED Triage Vitals  Encounter Vitals Group     BP 06/02/23 1029 113/80     Systolic BP Percentile --      Diastolic BP Percentile --      Pulse Rate 06/02/23 1029 79     Resp 06/02/23 1029 16     Temp 06/02/23 1029 98.3 F (36.8 C)     Temp src --      SpO2 --      Weight --      Height --      Head Circumference --      Peak Flow --      Pain Score 06/02/23 1033 9     Pain Loc --      Pain Education --      Exclude from Growth Chart --    No data found.  Updated Vital Signs BP 113/80   Pulse 79   Temp 98.3 F (36.8 C)    Resp 16   LMP 04/29/2011      Physical Exam Constitutional:      General: She is not in acute distress.    Appearance: She is well-developed.  HENT:     Head: Normocephalic and atraumatic.  Eyes:     Conjunctiva/sclera: Conjunctivae normal.     Pupils: Pupils are equal, round, and reactive to light.  Cardiovascular:     Rate and Rhythm: Normal rate.  Pulmonary:     Effort: Pulmonary effort is normal. No respiratory distress.  Abdominal:     General: There is no distension.     Palpations: Abdomen is soft.  Musculoskeletal:        General: Swelling present. Normal range of motion.     Cervical back: Normal range of motion.     Comments: On observation there is swelling in the right thigh versus the left.  There is tenderness from the medial thigh to the knee.  Mild swelling around the knee.  There is tenderness to deep palpation in the calf.  Negative Homans' sign.  Skin:    General: Skin is warm and dry.  Neurological:     Mental Status: She is alert.     Gait: Gait abnormal.      UC Treatments / Results  Labs (all labs ordered are listed, but only abnormal results are displayed) Labs Reviewed - No data to display  EKG   Radiology VAS [place] LOWER EXTREMITY VENOUS (DVT) (7a-7p) Result Date: 06/02/2023  Lower Venous DVT Study Patient Name:  Melissa Blankenship  Date of Exam:   06/02/2023 Medical Rec #: [id number]          Accession #:    [id number] Date of Birth: 11-Jul-1980          Patient Gender: F Patient Age:   58 years Exam Location:  [facility] Procedure:      VAS [place] LOWER EXTREMITY VENOUS (DVT) Referring Phys: Fayrene Helper --------------------------------------------------------------------------------  Indications: Swelling, and Edema.  Risk Factors: Past pregnancy. Comparison Study: None Performing Technologist: Shona Simpson  Examination Guidelines: A complete evaluation includes B-mode imaging, spectral Doppler, color Doppler, and power Doppler as needed of all  accessible portions of each vessel. Bilateral testing  is considered an integral part of a complete examination. Limited examinations for reoccurring indications may be performed as noted. The reflux portion of the exam is performed with the patient in reverse Trendelenburg.  +---------+---------------+---------+-----------+----------+--------------+ RIGHT    CompressibilityPhasicitySpontaneityPropertiesThrombus Aging +---------+---------------+---------+-----------+----------+--------------+ CFV      Full           Yes      Yes                                 +---------+---------------+---------+-----------+----------+--------------+ SFJ      Full                                                        +---------+---------------+---------+-----------+----------+--------------+ FV Prox  Full                                                        +---------+---------------+---------+-----------+----------+--------------+ FV Mid   Full                                                        +---------+---------------+---------+-----------+----------+--------------+ FV DistalFull                                                        +---------+---------------+---------+-----------+----------+--------------+ PFV      Full                                                        +---------+---------------+---------+-----------+----------+--------------+ POP      Full           Yes      Yes                                 +---------+---------------+---------+-----------+----------+--------------+ PTV      Full                                                        +---------+---------------+---------+-----------+----------+--------------+ PERO     Full                                                        +---------+---------------+---------+-----------+----------+--------------+   +----+---------------+---------+-----------+----------+--------------+  LEFTCompressibilityPhasicitySpontaneityPropertiesThrombus Aging +----+---------------+---------+-----------+----------+--------------+ CFV Full  Yes      Yes                                 +----+---------------+---------+-----------+----------+--------------+     Summary: RIGHT: - There is no evidence of deep vein thrombosis in the lower extremity.  - No cystic structure found in the popliteal fossa.  LEFT: - No evidence of common femoral vein obstruction.   *See table(s) above for measurements and observations.    Preliminary     Procedures Procedures (including critical care time)  Medications Ordered in UC Medications - No data to display  Initial Impression / Assessment and Plan / UC Course  I have reviewed the triage vital signs and the nursing notes.  Pertinent labs & imaging results that were available during my care of the patient were reviewed by me and considered in my medical decision making (see chart for details).     Without trauma or other etiology for leg pain and swelling I am concerned for DVT.  This is discussed with the patient.  I ordered a DVT study to be done at the hospital, somehow the patient ended up admitted to the emergency room.  DVT evaluations negative. Final Clinical Impressions(s) / UC Diagnoses   Final diagnoses:  Right leg pain  Right leg swelling     Discharge Instructions      The only place we can get an ultrasound done on Saturday is [facility].  Present directly to the radiology department.  Stay at the hospital after the test in case additional medical care is needed.  You will be called with your test result.   ED Prescriptions   None    PDMP not reviewed this encounter.   Eustace Moore, MD 06/02/23 [phone]

## 2023-06-02 NOTE — ED Triage Notes (Signed)
Pt is coming in for right inner thigh/right calf pain, it has been going on for around 4 days, it started off as a numb tingling feeling in her leg and has progressed into a burning painful sensation. She has no been on any recently long trips by car/plane. She doesn't have a stated Hx of DVT, she is not on blood thinners. She is otherwise stable at this time and can ambulate on the leg but does express lots of pain.

## 2023-06-02 NOTE — Discharge Instructions (Addendum)
It is unclear why you have swelling and burning. We recommend continued follow up with primary doctors.   Return to the ER if there is increased pain, numbness.

## 2023-06-18 ENCOUNTER — Ambulatory Visit: Payer: 59

## 2023-06-22 ENCOUNTER — Ambulatory Visit (INDEPENDENT_AMBULATORY_CARE_PROVIDER_SITE_OTHER): Payer: 59

## 2023-06-22 DIAGNOSIS — E538 Deficiency of other specified B group vitamins: Secondary | ICD-10-CM | POA: Diagnosis not present

## 2023-06-22 MED ORDER — CYANOCOBALAMIN 1000 MCG/ML IJ SOLN
1000.0000 ug | Freq: Once | INTRAMUSCULAR | Status: AC
Start: 2023-06-22 — End: 2023-06-22
  Administered 2023-06-22: 1000 ug via INTRAMUSCULAR

## 2023-06-22 NOTE — Progress Notes (Signed)
 Per orders of Dr. Salomon Fick, injection of B12 given by Vickii Chafe on Left Deltoid. Patient tolerated injection well.

## 2023-07-19 ENCOUNTER — Ambulatory Visit: Payer: 59

## 2023-07-24 ENCOUNTER — Ambulatory Visit: Payer: 59

## 2023-10-26 ENCOUNTER — Encounter: Payer: Self-pay | Admitting: Family Medicine

## 2023-10-26 ENCOUNTER — Ambulatory Visit (INDEPENDENT_AMBULATORY_CARE_PROVIDER_SITE_OTHER): Admitting: Family Medicine

## 2023-10-26 VITALS — BP 110/62 | HR 70 | Temp 98.0°F | Wt 133.0 lb

## 2023-10-26 DIAGNOSIS — J0100 Acute maxillary sinusitis, unspecified: Secondary | ICD-10-CM | POA: Diagnosis not present

## 2023-10-26 MED ORDER — AMOXICILLIN-POT CLAVULANATE 500-125 MG PO TABS
1.0000 | ORAL_TABLET | Freq: Two times a day (BID) | ORAL | 0 refills | Status: AC
Start: 2023-10-26 — End: 2023-11-02

## 2023-10-26 NOTE — Progress Notes (Signed)
 Established Patient Office Visit   Subjective  Patient ID: Melissa Blankenship, female    DOB: March 30, 1981  Age: 43 y.o. MRN: [id number]  No chief complaint on file.   Patient is a 43 year old female seen for acute concern.  Patient endorses nasal congestion, ear pain/pressure, facial pressure/pain, postnasal drainage causing cough and throat irritation x 2 weeks.  Patient notes increased, thick mucus difficult to expel from nose.  Tried OTC sinus medications without improvement in symptoms.  Has Flonase  which does not help.  Also tried Nettie pot which was not effective.    Patient Active Problem List   Diagnosis Date Noted   Vitamin D  deficiency 05/16/2023   Vitamin B12 deficiency 05/16/2023   Anxiety 02/09/2023   History of gastric ulcer 02/09/2023   COVID-19 01/03/2022   Allergic rhinitis 10/05/2021   Tobacco use disorder 02/10/2021   Health care maintenance 06/01/2020   GAD (generalized anxiety disorder) with panic attacks 01/09/2019   Asthma 01/09/2019   Seasonal allergies 01/09/2019   Gastroesophageal reflux disease 12/12/2018   Past Medical History:  Diagnosis Date   Anxiety    Bunion 05/14/2020   Depression    GERD (gastroesophageal reflux disease)    Thyroid  nodule    Viral upper respiratory tract infection 07/02/2020   Past Surgical History:  Procedure Laterality Date   ABDOMINAL HYSTERECTOMY     BUNIONECTOMY Left 06/22/2020   INCISION AND DRAINAGE / EXCISION THYROGLOSSAL CYST  2019   path report revealed hyperplastic thyroid  tissue, non-malignant    TUBAL LIGATION     Social History   Tobacco Use   Smoking status: Some Days    Current packs/day: 0.10    Types: Cigarettes   Smokeless tobacco: Never   Tobacco comments:    1 pk per month   Vaping Use   Vaping status: Never Used  Substance Use Topics   Alcohol use: Yes    Comment: Social   Drug use: No   Family History  Problem Relation Age of Onset   Lung cancer Mother    Diabetes Mellitus II  Mother    Hypertension Mother    Diabetes type II Father    Hypertension Father    CAD Maternal Grandfather    No Known Allergies  ROS Negative unless stated above    Objective:      BP 110/62 (BP Location: Left Arm, Cuff Size: Small)   Pulse 70   Temp 98 F (36.7 C) (Oral)   Wt 133 lb (60.3 kg)   LMP 04/29/2011   SpO2 95%   BMI 22.99 kg/m  BP Readings from Last 3 Encounters:  10/26/23 110/62  06/02/23 117/89  06/02/23 113/80   Wt Readings from Last 3 Encounters:  10/26/23 133 lb (60.3 kg)  05/02/23 135 lb 9.6 oz (61.5 kg)  03/26/23 131 lb 6.4 oz (59.6 kg)      Physical Exam Constitutional:      General: She is not in acute distress.    Appearance: Normal appearance.  HENT:     Head: Normocephalic and atraumatic.     Ears:     Comments: Bilateral TMs full.  No erythema, suppurative fluid.    Nose:     Right Sinus: No frontal sinus tenderness.     Left Sinus: Maxillary sinus tenderness present. No frontal sinus tenderness.     Mouth/Throat:     Mouth: Mucous membranes are moist.     Comments: Tonsil stone on the left. Cardiovascular:  Rate and Rhythm: Normal rate and regular rhythm.     Heart sounds: Normal heart sounds. No murmur heard.    No gallop.  Pulmonary:     Effort: Pulmonary effort is normal. No respiratory distress.     Breath sounds: Normal breath sounds. No wheezing, rhonchi or rales.  Skin:    General: Skin is warm and dry.  Neurological:     Mental Status: She is alert and oriented to person, place, and time.        10/26/2023    2:08 PM 05/02/2023    9:46 AM 03/26/2023    1:23 PM  Depression screen PHQ 2/9  Decreased Interest 1 1 1   Down, Depressed, Hopeless 1 1 1   PHQ - 2 Score 2 2 2   Altered sleeping 2 1 2   Tired, decreased energy 2 1 2   Change in appetite 2 1 1   Feeling bad or failure about yourself  0 0 0  Trouble concentrating 1 0 0  Moving slowly or fidgety/restless 0 0 0  Suicidal thoughts 0 0 0  PHQ-9 Score 9 5  7   Difficult doing work/chores   Somewhat difficult      10/26/2023    2:10 PM 05/02/2023    9:46 AM 03/26/2023    1:23 PM 02/09/2023    8:21 AM  GAD 7 : Generalized Anxiety Score  Nervous, Anxious, on Edge 1 1 1 1   Control/stop worrying  1 1 1   Worry too much - different things 2 1 1 1   Trouble relaxing  1 1 1   Restless 2 0 0 0  Easily annoyed or irritable 0 1 1 1   Afraid - awful might happen 0 0 0 1  Total GAD 7 Score  5 5 6   Anxiety Difficulty Somewhat difficult Somewhat difficult Somewhat difficult Somewhat difficult     No results found for any visits on 10/26/23.    Assessment & Plan:   Acute maxillary sinusitis, recurrence not specified -     Amoxicillin -Pot Clavulanate; Take 1 tablet by mouth in the morning and at bedtime for 7 days.  Dispense: 14 tablet; Refill: 0  Acute sinusitis.  Start ABX.  Okay to continue OTC antihistamines and other supportive care.  Given precautions.  Return if symptoms worsen or fail to improve.   Viola Greulich, MD

## 2023-11-23 ENCOUNTER — Ambulatory Visit: Admitting: Family Medicine

## 2023-11-30 ENCOUNTER — Ambulatory Visit (INDEPENDENT_AMBULATORY_CARE_PROVIDER_SITE_OTHER): Admitting: Family Medicine

## 2023-11-30 ENCOUNTER — Other Ambulatory Visit: Payer: Self-pay | Admitting: Family Medicine

## 2023-11-30 ENCOUNTER — Other Ambulatory Visit (HOSPITAL_COMMUNITY): Payer: Self-pay

## 2023-11-30 ENCOUNTER — Ambulatory Visit: Payer: Self-pay | Admitting: Family Medicine

## 2023-11-30 ENCOUNTER — Other Ambulatory Visit (HOSPITAL_COMMUNITY)
Admission: RE | Admit: 2023-11-30 | Discharge: 2023-11-30 | Disposition: A | Discharge status: Home / Self Care | Source: Ambulatory Visit | Attending: Family Medicine | Admitting: Family Medicine

## 2023-11-30 ENCOUNTER — Encounter: Payer: Self-pay | Admitting: Family Medicine

## 2023-11-30 ENCOUNTER — Ambulatory Visit
Admission: RE | Admit: 2023-11-30 | Discharge: 2023-11-30 | Disposition: A | Discharge status: Home / Self Care | Source: Ambulatory Visit | Attending: Family Medicine | Admitting: Family Medicine

## 2023-11-30 VITALS — BP 118/78 | HR 73 | Temp 98.0°F | Ht 63.0 in | Wt 130.0 lb

## 2023-11-30 DIAGNOSIS — R1031 Right lower quadrant pain: Secondary | ICD-10-CM | POA: Insufficient documentation

## 2023-11-30 DIAGNOSIS — E559 Vitamin D deficiency, unspecified: Secondary | ICD-10-CM

## 2023-11-30 DIAGNOSIS — Z90710 Acquired absence of both cervix and uterus: Secondary | ICD-10-CM | POA: Diagnosis not present

## 2023-11-30 DIAGNOSIS — F419 Anxiety disorder, unspecified: Secondary | ICD-10-CM

## 2023-11-30 DIAGNOSIS — E538 Deficiency of other specified B group vitamins: Secondary | ICD-10-CM

## 2023-11-30 DIAGNOSIS — K219 Gastro-esophageal reflux disease without esophagitis: Secondary | ICD-10-CM

## 2023-11-30 DIAGNOSIS — R102 Pelvic and perineal pain: Secondary | ICD-10-CM | POA: Diagnosis not present

## 2023-11-30 DIAGNOSIS — F411 Generalized anxiety disorder: Secondary | ICD-10-CM | POA: Diagnosis not present

## 2023-11-30 DIAGNOSIS — Z124 Encounter for screening for malignant neoplasm of cervix: Secondary | ICD-10-CM

## 2023-11-30 LAB — T4, FREE: Free T4: 0.74 ng/dL (ref 0.60–1.60)

## 2023-11-30 LAB — CBC WITH DIFFERENTIAL/PLATELET
Basophils Absolute: 0.1 10*3/uL (ref 0.0–0.1)
Basophils Relative: 1 % (ref 0.0–3.0)
Eosinophils Absolute: 0.1 10*3/uL (ref 0.0–0.7)
Eosinophils Relative: 2.1 % (ref 0.0–5.0)
HCT: 42 % (ref 36.0–46.0)
Hemoglobin: 13.8 g/dL (ref 12.0–15.0)
Lymphocytes Relative: 45.5 % (ref 12.0–46.0)
Lymphs Abs: 2.4 10*3/uL (ref 0.7–4.0)
MCHC: 32.9 g/dL (ref 30.0–36.0)
MCV: 88.7 fl (ref 78.0–100.0)
Monocytes Absolute: 0.5 10*3/uL (ref 0.1–1.0)
Monocytes Relative: 8.8 % (ref 3.0–12.0)
Neutro Abs: 2.2 10*3/uL (ref 1.4–7.7)
Neutrophils Relative %: 42.6 % — ABNORMAL LOW (ref 43.0–77.0)
Platelets: 174 10*3/uL (ref 150.0–400.0)
RBC: 4.73 Mil/uL (ref 3.87–5.11)
RDW: 13.6 % (ref 11.5–15.5)
WBC: 5.2 10*3/uL (ref 4.0–10.5)

## 2023-11-30 LAB — COMPREHENSIVE METABOLIC PANEL WITH GFR
ALT: 13 U/L (ref 0–35)
AST: 15 U/L (ref 0–37)
Albumin: 4.2 g/dL (ref 3.5–5.2)
Alkaline Phosphatase: 82 U/L (ref 39–117)
BUN: 6 mg/dL (ref 6–23)
CO2: 24 meq/L (ref 19–32)
Calcium: 9.2 mg/dL (ref 8.4–10.5)
Chloride: 107 meq/L (ref 96–112)
Creatinine, Ser: 0.77 mg/dL (ref 0.40–1.20)
GFR: 93.69 mL/min (ref >60.00)
Glucose, Bld: 80 mg/dL (ref 70–99)
Potassium: 3.6 meq/L (ref 3.5–5.1)
Sodium: 139 meq/L (ref 135–145)
Total Bilirubin: 0.4 mg/dL (ref 0.2–1.2)
Total Protein: 7.4 g/dL (ref 6.0–8.3)

## 2023-11-30 LAB — VITAMIN D 25 HYDROXY (VIT D DEFICIENCY, FRACTURES): VITD: 25.05 ng/mL — ABNORMAL LOW (ref 30.00–100.00)

## 2023-11-30 LAB — TSH: TSH: 0.47 u[IU]/mL (ref 0.35–5.50)

## 2023-11-30 LAB — VITAMIN B12: Vitamin B-12: 203 pg/mL — ABNORMAL LOW (ref 211–911)

## 2023-11-30 MED ORDER — VITAMIN D (ERGOCALCIFEROL) 1.25 MG (50000 UNIT) PO CAPS
50000.0000 [IU] | ORAL_CAPSULE | ORAL | 0 refills | Status: AC
  Filled 2023-11-30: qty 12, 84d supply, fill #0

## 2023-11-30 NOTE — Progress Notes (Signed)
 Established Patient Office Visit   Subjective  Patient ID: Melissa Blankenship, female    DOB: 07/27/1980  Age: 43 y.o. MRN: [id number]  Chief Complaint  Patient presents with   Gynecologic Exam    Pap; no other concerns    Patient is a 43 year old female seen for follow-up.  Patient initially made visit for pelvic exam to evaluate vaginal cuff status post hysterectomy.  Patient mentions right lower quadrant abdominal pain x 2 weeks, intermittent, pulselike sensation.  Patient initially thought symptoms were related to gas.  Tried laxative with mild relief.  Typically has BM q. 2 days.  Denies straining, pelvic fullness/pressure, dysuria, freq, back pain, n/v, fever, chills.  Trying to eat more vegetables in the last few days.  May eat once or twice a day due to schedule.  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     Patient Active Problem List   Diagnosis Date Noted   Vitamin D  deficiency 05/16/2023   Vitamin B12 deficiency 05/16/2023   Anxiety 02/09/2023   History of gastric ulcer 02/09/2023   COVID-19 01/03/2022   Allergic rhinitis 10/05/2021   Tobacco use disorder 02/10/2021   Health care maintenance 06/01/2020   GAD (generalized anxiety disorder) with panic attacks 01/09/2019   Asthma 01/09/2019   Seasonal allergies 01/09/2019   Gastroesophageal reflux disease 12/12/2018   Past Medical History:  Diagnosis Date   Anxiety    Bunion 05/14/2020   Depression    GERD (gastroesophageal reflux disease)    Thyroid  nodule    Viral upper respiratory tract infection 07/02/2020   Past Surgical History:  Procedure Laterality Date   ABDOMINAL HYSTERECTOMY     BUNIONECTOMY Left 06/22/2020   INCISION AND DRAINAGE / EXCISION THYROGLOSSAL CYST  2019   path report revealed hyperplastic thyroid  tissue, non-malignant    TUBAL LIGATION     Social History   Tobacco Use   Smoking status: Some Days    Current packs/day: 0.10     Types: Cigarettes   Smokeless tobacco: Never   Tobacco comments:    1 pk per month   Vaping Use   Vaping status: Never Used  Substance Use Topics   Alcohol use: Yes    Comment: Social   Drug use: No   Family History  Problem Relation Age of Onset   Lung cancer Mother    Diabetes Mellitus II Mother    Hypertension Mother    Diabetes type II Father    Hypertension Father    CAD Maternal Grandfather    No Known Allergies  ROS Negative unless stated above    Objective:     BP 118/78   Pulse 73   Temp 98 F (36.7 C)   Ht 5' 3 (1.6 m)   Wt 130 lb (59 kg)   LMP 04/29/2011   SpO2 98%   BMI 23.03 kg/m  BP Readings from Last 3 Encounters:  11/30/23 118/78  10/26/23 110/62  06/02/23 117/89   Wt Readings from Last 3 Encounters:  11/30/23 130 lb (59 kg)  10/26/23 133 lb (60.3 kg)  05/02/23 135 lb 9.6 oz (61.5 kg)      Physical Exam Constitutional:      General: She is not in acute distress.    Appearance: Normal appearance.  HENT:     Head: Normocephalic and atraumatic.     Nose: Nose normal.     Mouth/Throat:     Mouth:  Mucous membranes are moist.   Cardiovascular:     Rate and Rhythm: Normal rate and regular rhythm.     Heart sounds: Normal heart sounds. No murmur heard.    No gallop.  Pulmonary:     Effort: Pulmonary effort is normal. No respiratory distress.     Breath sounds: Normal breath sounds. No wheezing, rhonchi or rales.  Abdominal:     General: Bowel sounds are normal.     Palpations: Abdomen is soft. There is no mass.     Tenderness: There is abdominal tenderness in the right lower quadrant. There is no guarding or rebound.     Hernia: No hernia is present.     Comments: Mild TTP of RUQ, mod TTP of RLQ.  Genitourinary:    General: Normal vulva.     Urethra: No prolapse.     Vagina: Vaginal discharge present.     Uterus: Absent.      Comments: Cervix and uterus surgically absent.  Scant whitish d/c in vaginal vault.   Skin:     General: Skin is warm and dry.   Neurological:     Mental Status: She is alert and oriented to person, place, and time.        10/26/2023    2:08 PM 05/02/2023    9:46 AM 03/26/2023    1:23 PM  Depression screen PHQ 2/9  Decreased Interest 1 1 1   Down, Depressed, Hopeless 1 1 1   PHQ - 2 Score 2 2 2   Altered sleeping 2 1 2   Tired, decreased energy 2 1 2   Change in appetite 2 1 1   Feeling bad or failure about yourself  0 0 0  Trouble concentrating 1 0 0  Moving slowly or fidgety/restless 0 0 0  Suicidal thoughts 0 0 0  PHQ-9 Score 9 5 7   Difficult doing work/chores   Somewhat difficult      10/26/2023    2:10 PM 05/02/2023    9:46 AM 03/26/2023    1:23 PM 02/09/2023    8:21 AM  GAD 7 : Generalized Anxiety Score  Nervous, Anxious, on Edge 1 1 1 1   Control/stop worrying  1 1 1   Worry too much - different things 2 1 1 1   Trouble relaxing  1 1 1   Restless 2 0 0 0  Easily annoyed or irritable 0 1 1 1   Afraid - awful might happen 0 0 0 1  Total GAD 7 Score  5 5 6   Anxiety Difficulty Somewhat difficult Somewhat difficult Somewhat difficult Somewhat difficult     No results found for any visits on 11/30/23.    Assessment & Plan:   Right lower quadrant abdominal pain -     Cervicovaginal ancillary only -     CBC with Differential/Platelet; Future -     Comprehensive metabolic panel with GFR; Future -     TSH; Future -     T4, free; Future -     US  Abdomen Limited; Future -     Vitamin B12; Future -     POCT urinalysis dipstick  GAD (generalized anxiety disorder) with panic attacks -     TSH; Future -     T4, free; Future  Vitamin B12 deficiency -     Vitamin B12; Future  Vitamin D  deficiency -     VITAMIN D  25 Hydroxy (Vit-D Deficiency, Fractures); Future  S/P hysterectomy  Acute RLQ pain x 2 weeks.  Discussed possible causes including appendicitis,  constipation, ovarian cyst, pelvic organ prolapse, hernia, renal calculi, UTI, diverticulitis.  Pelvic exam  negative.  Aptima swab collected.  Obtain UA and labs.  Abdominal ultrasound ordered.  Patient given strict precautions.  Further recommendations based on results.  Return if symptoms worsen or fail to improve.   Viola Greulich, MD

## 2023-12-03 ENCOUNTER — Other Ambulatory Visit (HOSPITAL_COMMUNITY): Payer: Self-pay

## 2023-12-03 ENCOUNTER — Other Ambulatory Visit: Payer: Self-pay | Admitting: Family Medicine

## 2023-12-03 ENCOUNTER — Ambulatory Visit (INDEPENDENT_AMBULATORY_CARE_PROVIDER_SITE_OTHER)

## 2023-12-03 ENCOUNTER — Ambulatory Visit

## 2023-12-03 DIAGNOSIS — B3731 Acute candidiasis of vulva and vagina: Secondary | ICD-10-CM

## 2023-12-03 DIAGNOSIS — E538 Deficiency of other specified B group vitamins: Secondary | ICD-10-CM

## 2023-12-03 DIAGNOSIS — A599 Trichomoniasis, unspecified: Secondary | ICD-10-CM

## 2023-12-03 DIAGNOSIS — B9689 Other specified bacterial agents as the cause of diseases classified elsewhere: Secondary | ICD-10-CM

## 2023-12-03 LAB — CERVICOVAGINAL ANCILLARY ONLY
Bacterial Vaginitis (gardnerella): POSITIVE — AB
Candida Glabrata: POSITIVE — AB
Candida Vaginitis: NEGATIVE
Comment: NEGATIVE
Comment: NEGATIVE
Comment: NEGATIVE
Comment: NEGATIVE
Trichomonas: POSITIVE — AB

## 2023-12-03 MED ORDER — METRONIDAZOLE 500 MG PO TABS
500.0000 mg | ORAL_TABLET | Freq: Two times a day (BID) | ORAL | 0 refills | Status: AC
  Filled 2023-12-03: qty 14, 7d supply, fill #0

## 2023-12-03 MED ORDER — CYANOCOBALAMIN 1000 MCG/ML IJ SOLN
1000.0000 ug | Freq: Once | INTRAMUSCULAR | Status: AC
Start: 2023-12-03 — End: 2023-12-03
  Administered 2023-12-03: 1000 ug via INTRAMUSCULAR

## 2023-12-03 MED ORDER — FLUCONAZOLE 150 MG PO TABS
150.0000 mg | ORAL_TABLET | ORAL | 0 refills | Status: AC
  Filled 2023-12-03: qty 2, 2d supply, fill #0

## 2023-12-03 NOTE — Progress Notes (Signed)
 Patient is in office today for a nurse visit for B12 Injection. Patient Injection was given in the  Left deltoid. Patient tolerated injection well.

## 2023-12-04 ENCOUNTER — Telehealth: Payer: Self-pay

## 2023-12-04 NOTE — Telephone Encounter (Signed)
Seen other note

## 2023-12-04 NOTE — Telephone Encounter (Signed)
 Copied from CRM [id number]. Topic: Clinical - Lab/Test Results >> Dec 04, 2023  2:49 PM Berneda FALCON wrote: Reason for CRM: Pt is requesting call back about her test results please. Would like a call as soon as possible please. She would not like to speak to a nurse, only wants to speak to Dr. Mercer. I did let her know that she is seeing patients and it may not be right away for her to talk to the doctor directly but she insists and is concerned about the results and would like a call just as soon as possible please.  Pt callback is [phone]

## 2023-12-05 ENCOUNTER — Ambulatory Visit: Admitting: Family Medicine

## 2023-12-25 ENCOUNTER — Telehealth: Payer: Self-pay | Admitting: Family Medicine

## 2023-12-25 NOTE — Telephone Encounter (Signed)
 Patient was scheduled for a TOC from Dr. Mercer to you. Please advise.

## 2023-12-27 ENCOUNTER — Telehealth: Payer: Self-pay | Admitting: Family Medicine

## 2023-12-27 NOTE — Telephone Encounter (Signed)
 Patient has been approved to return to our practice for primary care.  Attempted to contact the patient to make aware, but no answer.  Left detailed message asking to call our office back at [phone] and asked to be transferred to me.   Copied from CRM [id number]. Topic: Appointments - Transfer of Care >> Dec 25, 2023  4:53 PM Fredrica W wrote: Pt is requesting to transfer FROM: [facility]  Pt is requesting to transfer TO: [facility]  Reason for requested transfer: Would like to return to previous provider. [facility] not currently accepting new patient but patient was last seen less 3 years ago. Would like to know if she can still be seen at [facility]  It is the responsibility of the team the patient would like to transfer to ([facility]) to reach out to the patient if for any reason this transfer is not acceptable. >> Dec 27, 2023  8:37 AM Alfonso ORN wrote: patient requesting to reestablish care with [facility] , patient stated she was a patient at one time with the practice , patient currently with [facility] and not happy with provider  Per CAL  waiting for the person to response who is currently out of the office will call patient back  patient [phone]

## 2023-12-28 NOTE — Telephone Encounter (Signed)
 Pt has an appt 01/18/24.

## 2024-01-03 ENCOUNTER — Ambulatory Visit

## 2024-01-18 ENCOUNTER — Other Ambulatory Visit: Payer: Self-pay

## 2024-01-18 ENCOUNTER — Ambulatory Visit

## 2024-01-18 ENCOUNTER — Other Ambulatory Visit (HOSPITAL_COMMUNITY): Payer: Self-pay

## 2024-01-18 VITALS — BP 116/70 | HR 83 | Temp 98.4°F | Ht 63.0 in | Wt 132.2 lb

## 2024-01-18 DIAGNOSIS — E559 Vitamin D deficiency, unspecified: Secondary | ICD-10-CM

## 2024-01-18 DIAGNOSIS — M25511 Pain in right shoulder: Secondary | ICD-10-CM | POA: Diagnosis not present

## 2024-01-18 DIAGNOSIS — Z0000 Encounter for general adult medical examination without abnormal findings: Secondary | ICD-10-CM

## 2024-01-18 DIAGNOSIS — F17200 Nicotine dependence, unspecified, uncomplicated: Secondary | ICD-10-CM

## 2024-01-18 DIAGNOSIS — F411 Generalized anxiety disorder: Secondary | ICD-10-CM

## 2024-01-18 DIAGNOSIS — E538 Deficiency of other specified B group vitamins: Secondary | ICD-10-CM | POA: Diagnosis not present

## 2024-01-18 DIAGNOSIS — F17210 Nicotine dependence, cigarettes, uncomplicated: Secondary | ICD-10-CM

## 2024-01-18 MED ORDER — ESCITALOPRAM OXALATE 10 MG PO TABS
5.0000 mg | ORAL_TABLET | Freq: Every day | ORAL | 2 refills | Status: DC
  Filled 2024-01-18: qty 30, 60d supply, fill #0

## 2024-01-18 MED ORDER — VARENICLINE TARTRATE 1 MG PO TABS
1.0000 mg | ORAL_TABLET | Freq: Two times a day (BID) | ORAL | 2 refills | Status: AC
  Filled 2024-01-18: qty 168, 84d supply, fill #0

## 2024-01-18 MED ORDER — VARENICLINE TARTRATE 0.5 MG PO TABS
ORAL_TABLET | ORAL | 0 refills | Status: AC
  Filled 2024-01-18: qty 11, 7d supply, fill #0

## 2024-01-18 NOTE — Patient Instructions (Addendum)
 You came in today to establish care with us .  Please follow the directions as discussed in today's plan: --Start taking Lexapro  5 mg daily for anxiety and low mood --Start Chantix  for smoking: You will start taking this medication a week before you decide to quit smoking completely.  These are the instructions for week 1: Days 1-3: Take 0.5 mg Chantix  daily Days 47: Take 0.5 mg Chantix  2 times daily Starting week 2: Start taking 1 mg Chantix  once a day. --Will place referral for behavioral health therapist --Will place a physical therapy referral --Will place a referral for colonoscopy --Continue taking vitamin D  as prescribed --Will get B12 shot during next visit --See us  in 1 month

## 2024-01-18 NOTE — Progress Notes (Signed)
 CC: Establishing care  HPI:  [name] is a 43 y.o. female living with a history stated below and presents today to establish care back with us  after 2 years.    Patient has a history of panic attacks.  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  Patient said that she woke up at least 2 times every week in the past month with difficulty breathing and feeling anxious in the middle of the night--feels as if the world is coming to an end.  She also mentioned thinking about her mom and often calling her daughter at home to check up on her mom.  Patient was prescribed Xanax  as needed for her anxiety.  She said she has not taken it this month as she has tried practicing breathing with pursed lips technique when she is going through anxiety.  Patient also uses trazodone  for difficulty falling asleep.  Patient smokes about 4 cigarettes weekly.  She mentioned stopping the use of cigarettes completely but restarting the end of 2024 because of her anxiety.  Patient also complained about right shoulder pain that has been going on for the past month.  She said right shoulder gets tender when she tries to lift things or move her hands.  She also mentions her fingers on the right hand having a tingling sensation.  Please refer to assessment and plan for more details   Past Medical History:  Diagnosis Date   Anxiety    Bunion 05/14/2020   Depression    GERD (gastroesophageal reflux disease)    Thyroid  nodule    Viral upper respiratory tract infection 07/02/2020    Current Outpatient Medications on File Prior to Visit  Medication Sig Dispense Refill   albuterol  (VENTOLIN  HFA) 108 (90 Base) MCG/ACT inhaler Inhale 2 puffs into the lungs every 6 (six) hours as needed for wheezing or shortness of breath. 8 g 1   ALPRAZolam  (XANAX ) 0.25 MG tablet Take 1-2 tablets (0.25-0.5 mg total) by mouth 3  (three) times daily as needed (Panic attacks). 90 tablet 1   fluticasone  (FLONASE ) 50 MCG/ACT nasal spray Place 1 spray into both nostrils daily. 16 g 2   omeprazole  (PRILOSEC) 40 MG capsule Take by mouth.     traZODone  (DESYREL ) 50 MG tablet Take 1 tablet (50 mg total) by mouth at bedtime. 90 tablet 1   Vitamin D , Ergocalciferol , (DRISDOL ) 1.25 MG (50000 UNIT) CAPS capsule Take 1 capsule (50,000 Units total) by mouth every 7 (seven) days. 12 capsule 0   No current facility-administered medications on file prior to visit.    Family History  Problem Relation Age of Onset   Lung cancer Mother    Diabetes Mellitus II Mother    Hypertension Mother    Diabetes type II Father    Hypertension Father    CAD Maternal Grandfather     Social History   Socioeconomic History   Marital status: Single    Spouse name: Not on file   Number of children: Not on file   Years of education: Not on file   Highest education level: Not on file  Occupational History   Not on file  Tobacco Use   Smoking status: Some Days    Current packs/day: 0.10    Types: Cigarettes   Smokeless tobacco: Never   Tobacco comments:    1 pk per month  Vaping Use   Vaping status: Never Used  Substance and Sexual Activity   Alcohol use: Yes    Comment: Social   Drug use: No   Sexual activity: Yes    Birth control/protection: Injection  Other Topics Concern   Not on file  Social History Narrative   Not on file   Social Drivers of Health   Financial Resource Strain: Not on file  Food Insecurity: Not on file  Transportation Needs: Not on file  Physical Activity: Not on file  Stress: Not on file  Social Connections: Not on file  Intimate Partner Violence: Not on file    Review of Systems: ROS  All pertinent review of systems in HPI and assessment and plan Vitals:   01/18/24 0952  BP: 116/70  Pulse: 83  Temp: 98.4 F (36.9 C)  TempSrc: Oral  SpO2: 100%  Weight: 132 lb 3.2 oz (60 kg)  Height: 5'  3 (1.6 m)    Physical Exam: Physical Exam Cardiovascular:     Rate and Rhythm: Normal rate and regular rhythm.     Comments: +2 radial pulses bilaterally Pulmonary:     Effort: Pulmonary effort is normal.     Breath sounds: Normal breath sounds.  Abdominal:     Palpations: Abdomen is soft.     Tenderness: There is no abdominal tenderness. There is no guarding.  Musculoskeletal:     Comments: Right shoulder:  Good range of motion.  However, flexion causes tenderness --Empty can test --Negative drop arm test --Positive painful arc test  Neurological:     General: No focal deficit present.     Mental Status: She is alert.     Cranial Nerves: No cranial nerve deficit.     Motor: No weakness.  Psychiatric:        Mood and Affect: Mood normal.      Assessment & Plan:     Patient seen with Dr. Karna  Assessment & Plan GAD (generalized anxiety disorder) with panic attacks Patient was worried about her anxiety and depressed mood especially in the past month.  Talked about starting her on an SSRI but she was concerned that it could lead to dependence.  Explained to patient that her being on Xanax  could lead to more dependence and that SSRIs were usually first-line in treating depression/anxiety with extremely low chances of dependence.  Patient agreed to start on Lexapro  10 mg.  Also, asked her to limit use of Xanax  as needed.  Patient mentioned talking to previous behavioral therapist at [facility] years ago.  We talked about her wanting to restart behavioral health care with his which she agreed to. Plan: --Start Lexapro  10 mg tablet daily --Behavioral health [facility] consulted --Continue Xanax  0.25 mg as needed --Continue trazodone  50 mg tablet for sleep as needed Health care maintenance Patient due for colonoscopy per Dr. Clotilda Single note. --Colonoscopy referral placed Tobacco use disorder Patient mentioned that she would like to quit smoking.  Talk to her about adverse effects of  smoking and discussed options to help quit smoking.  Agreed to start Chantix .  Instructed patient to start taking Chantix  1 week before she decides to completely quit.  Gave her the following instructions for Chantix  dosing for week 1 and later.  Will check up on patient in a month. --Plan  week 1: Days 1-3: Take 0.5 mg Chantix  daily Days 4-7: Take 0.5 mg Chantix  2 times daily Week 2: Start taking 1 mg Chantix  once a day.  --Follow-up  in 4 weeks  Vitamin D  deficiency Patient's vitamin D  levels on 11/30/2023 was low at 25.05.  She was prescribed vitamin D  by Dr. Clotilda Single.  Asked patient to continue taking vitamin D . --Continue vitamin D  1.25 mg(50,000 unit) capsule weekly for 12 weeks Vitamin B12 deficiency Patient's vitamin D  levels were low on 11/30/2023 at 203.  She received vitamin D  shot from Dr. Clotilda Single.  Patient preferred getting a shot over oral supplements.  Discussed she could get 1 during her next visit. --Vitamin B12 shot due next visit Acute pain of right shoulder We suspect patient's right shoulder pain is likely due to rotator cuff tendinopathy as per her physical exam findings. Patient has tried taking Aleve  and ice packs, warm compress for relief.  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  We believe she would benefit from physical therapy along with taking NSAIDs or Tylenol . --Physical therapy consulted --Take ibuprofen/Tylenol  as needed for pain   No orders of the defined types were placed in this encounter.    Rebecka Pion, D.O. [facility] Internal Medicine, PGY-1 Date 01/18/2024 Time 9:56 AM

## 2024-01-21 NOTE — Assessment & Plan Note (Signed)
 Patient mentioned that she would like to quit smoking.  Talk to her about adverse effects of smoking and discussed options to help quit smoking.  Agreed to start Chantix .  Instructed patient to start taking Chantix  1 week before she decides to completely quit.  Gave her the following instructions for Chantix  dosing for week 1 and later.  Will check up on patient in a month. --Plan  week 1: Days 1-3: Take 0.5 mg Chantix  daily Days 4-7: Take 0.5 mg Chantix  2 times daily Week 2: Start taking 1 mg Chantix  once a day.  --Follow-up in 4 weeks

## 2024-01-21 NOTE — Assessment & Plan Note (Signed)
 Patient's vitamin D  levels on 11/30/2023 was low at 25.05.  She was prescribed vitamin D  by Dr. Clotilda Single.  Asked patient to continue taking vitamin D . --Continue vitamin D  1.25 mg(50,000 unit) capsule weekly for 12 weeks

## 2024-01-21 NOTE — Assessment & Plan Note (Signed)
 Patient due for colonoscopy per Dr. Clotilda Single note. --Colonoscopy referral placed

## 2024-01-21 NOTE — Assessment & Plan Note (Signed)
 Patient was worried about her anxiety and depressed mood especially in the past month.  Talked about starting her on an SSRI but she was concerned that it could lead to dependence.  Explained to patient that her being on Xanax  could lead to more dependence and that SSRIs were usually first-line in treating depression/anxiety with extremely low chances of dependence.  Patient agreed to start on Lexapro  10 mg.  Also, asked her to limit use of Xanax  as needed.  Patient mentioned talking to previous behavioral therapist at [facility] years ago.  We talked about her wanting to restart behavioral health care with his which she agreed to. Plan: --Start Lexapro  10 mg tablet daily --Behavioral health [facility] consulted --Continue Xanax  0.25 mg as needed --Continue trazodone  50 mg tablet for sleep as needed

## 2024-01-21 NOTE — Assessment & Plan Note (Signed)
 We suspect patient's right shoulder pain is likely due to rotator cuff tendinopathy as per her physical exam findings. Patient has tried taking Aleve  and ice packs, warm compress for relief.  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  We believe she would benefit from physical therapy along with taking NSAIDs or Tylenol . --Physical therapy consulted --Take ibuprofen/Tylenol  as needed for pain

## 2024-01-21 NOTE — Assessment & Plan Note (Signed)
 Patient's vitamin D  levels were low on 11/30/2023 at 203.  She received vitamin D  shot from Dr. Clotilda Single.  Patient preferred getting a shot over oral supplements.  Discussed she could get 1 during her next visit. --Vitamin B12 shot due next visit

## 2024-01-31 ENCOUNTER — Encounter: Admitting: Physician Assistant

## 2024-02-04 ENCOUNTER — Ambulatory Visit

## 2024-02-04 ENCOUNTER — Telehealth: Payer: Self-pay

## 2024-02-06 NOTE — Progress Notes (Signed)
 Internal Medicine Clinic Attending  I was physically present during the key portions of the resident provided service and participated in the medical decision making of patient's management care. I reviewed pertinent patient test results.  The assessment, diagnosis, and plan were formulated together and I agree with the documentation in the resident's note.  Dickie La, MD

## 2024-02-14 ENCOUNTER — Ambulatory Visit: Admitting: Physical Therapy

## 2024-02-14 NOTE — Therapy (Deleted)
 OUTPATIENT PHYSICAL THERAPY LOWER EXTREMITY EVALUATION   Patient Name: Melissa Blankenship MRN: [id number] DOB:January 13, 1981, 43 y.o., female Today's Date: 02/14/2024  END OF SESSION:   Past Medical History:  Diagnosis Date   Anxiety    Bunion 05/14/2020   Depression    GERD (gastroesophageal reflux disease)    Thyroid  nodule    Viral upper respiratory tract infection 07/02/2020   Past Surgical History:  Procedure Laterality Date   ABDOMINAL HYSTERECTOMY     BUNIONECTOMY Left 06/22/2020   INCISION AND DRAINAGE / EXCISION THYROGLOSSAL CYST  2019   path report revealed hyperplastic thyroid  tissue, non-malignant    TUBAL LIGATION     Patient Active Problem List   Diagnosis Date Noted   Acute pain of right shoulder 01/18/2024   Vitamin D  deficiency 05/16/2023   Vitamin B12 deficiency 05/16/2023   Anxiety 02/09/2023   History of gastric ulcer 02/09/2023   COVID-19 01/03/2022   Allergic rhinitis 10/05/2021   Tobacco use disorder 02/10/2021   Health care maintenance 06/01/2020   GAD (generalized anxiety disorder) with panic attacks 01/09/2019   Asthma 01/09/2019   Seasonal allergies 01/09/2019   Gastroesophageal reflux disease 12/12/2018    PCP: ***  REFERRING PROVIDER: ***  REFERRING DIAG: ***  THERAPY DIAG:  No diagnosis found.  Rationale for Evaluation and Treatment: {HABREHAB:27488}  ONSET DATE: ***  SUBJECTIVE:   SUBJECTIVE STATEMENT: ***  PERTINENT HISTORY: *** PAIN:  Are you having pain? {OPRCPAIN:27236}  PRECAUTIONS: {Therapy precautions:24002}  RED FLAGS: {PT Red Flags:29287}   WEIGHT BEARING RESTRICTIONS: {Yes ***/No:24003}  FALLS:  Has patient fallen in last 6 months? {fallsyesno:27318}  LIVING ENVIRONMENT: Lives with: {OPRC lives with:25569::lives with their family} Lives in: {Lives in:25570} Stairs: {opstairs:27293} Has following equipment at home: {Assistive devices:23999}  OCCUPATION: ***  PLOF: {PLOF:24004}  PATIENT GOALS:  ***  NEXT MD VISIT: ***  OBJECTIVE:  Note: Objective measures were completed at Evaluation unless otherwise noted.  DIAGNOSTIC FINDINGS: ***  PATIENT SURVEYS:  {rehab surveys:24030}  COGNITION: Overall cognitive status: {cognition:24006}     SENSATION: {sensation:27233}  EDEMA:  {edema:24020}  MUSCLE LENGTH: Hamstrings: Right *** deg; Left *** deg Debby test: Right *** deg; Left *** deg  POSTURE: {posture:25561}  PALPATION: ***  LOWER EXTREMITY ROM:  {AROM/PROM:27142} ROM Right eval Left eval  Hip flexion    Hip extension    Hip abduction    Hip adduction    Hip internal rotation    Hip external rotation    Knee flexion    Knee extension    Ankle dorsiflexion    Ankle plantarflexion    Ankle inversion    Ankle eversion     (Blank rows = not tested)  LOWER EXTREMITY MMT:  MMT Right eval Left eval  Hip flexion    Hip extension    Hip abduction    Hip adduction    Hip internal rotation    Hip external rotation    Knee flexion    Knee extension    Ankle dorsiflexion    Ankle plantarflexion    Ankle inversion    Ankle eversion     (Blank rows = not tested)  LOWER EXTREMITY SPECIAL TESTS:  {LEspecialtests:26242}  FUNCTIONAL TESTS:  {Functional tests:24029}  GAIT: Distance walked: *** Assistive device utilized: {Assistive devices:23999} Level of assistance: {Levels of assistance:24026} Comments: ***  TREATMENT DATE: ***    PATIENT EDUCATION:  Education details: *** Person educated: {Person educated:25204} Education method: {Education Method:25205} Education comprehension: {Education Comprehension:25206}  HOME EXERCISE PROGRAM: ***  ASSESSMENT:  CLINICAL IMPRESSION: Patient is a *** y.o. *** who was seen today for physical therapy evaluation and treatment for ***.   OBJECTIVE IMPAIRMENTS:  {opptimpairments:25111}.   ACTIVITY LIMITATIONS: {activitylimitations:27494}  PARTICIPATION LIMITATIONS: {participationrestrictions:25113}  PERSONAL FACTORS: {Personal factors:25162} are also affecting patient's functional outcome.   REHAB POTENTIAL: {rehabpotential:25112}  CLINICAL DECISION MAKING: {clinical decision making:25114}  EVALUATION COMPLEXITY: {Evaluation complexity:25115}   GOALS: Goals reviewed with patient? {yes/no:20286}  SHORT TERM GOALS: Target date: *** *** Baseline: Goal status: INITIAL  2.  *** Baseline:  Goal status: INITIAL  3.  *** Baseline:  Goal status: INITIAL  4.  *** Baseline:  Goal status: INITIAL  5.  *** Baseline:  Goal status: INITIAL  6.  *** Baseline:  Goal status: INITIAL  LONG TERM GOALS: Target date: ***  *** Baseline:  Goal status: INITIAL  2.  *** Baseline:  Goal status: INITIAL  3.  *** Baseline:  Goal status: INITIAL  4.  *** Baseline:  Goal status: INITIAL  5.  *** Baseline:  Goal status: INITIAL  6.  *** Baseline:  Goal status: INITIAL   PLAN:  PT FREQUENCY: {rehab frequency:25116}  PT DURATION: {rehab duration:25117}  PLANNED INTERVENTIONS: {rehab planned interventions:25118::97110-Therapeutic exercises,97530- Therapeutic [phone]- Neuromuscular re-education,97535- Self Rjmz,02859- Manual therapy}  PLAN FOR NEXT SESSION: ***   Justin Buechner, PT 02/14/2024, 7:55 AM

## 2024-02-19 ENCOUNTER — Institutional Professional Consult (permissible substitution): Admitting: Licensed Clinical Social Worker

## 2024-02-22 ENCOUNTER — Encounter: Admitting: Student

## 2024-02-25 ENCOUNTER — Other Ambulatory Visit: Payer: Self-pay

## 2024-02-25 DIAGNOSIS — Z1231 Encounter for screening mammogram for malignant neoplasm of breast: Secondary | ICD-10-CM

## 2024-03-06 ENCOUNTER — Ambulatory Visit

## 2024-03-06 ENCOUNTER — Other Ambulatory Visit (HOSPITAL_COMMUNITY): Payer: Self-pay

## 2024-03-06 VITALS — BP 105/72 | HR 78 | Temp 98.8°F | Ht 63.0 in | Wt 133.8 lb

## 2024-03-06 DIAGNOSIS — E538 Deficiency of other specified B group vitamins: Secondary | ICD-10-CM

## 2024-03-06 DIAGNOSIS — F411 Generalized anxiety disorder: Secondary | ICD-10-CM

## 2024-03-06 DIAGNOSIS — F17200 Nicotine dependence, unspecified, uncomplicated: Secondary | ICD-10-CM

## 2024-03-06 DIAGNOSIS — M25511 Pain in right shoulder: Secondary | ICD-10-CM | POA: Diagnosis not present

## 2024-03-06 DIAGNOSIS — F17210 Nicotine dependence, cigarettes, uncomplicated: Secondary | ICD-10-CM

## 2024-03-06 DIAGNOSIS — Z23 Encounter for immunization: Secondary | ICD-10-CM | POA: Diagnosis not present

## 2024-03-06 DIAGNOSIS — Z0000 Encounter for general adult medical examination without abnormal findings: Secondary | ICD-10-CM

## 2024-03-06 MED ORDER — ALPRAZOLAM 0.25 MG PO TABS
0.2500 mg | ORAL_TABLET | Freq: Three times a day (TID) | ORAL | 0 refills | Status: AC | PRN
  Filled 2024-03-06: qty 30, 5d supply, fill #0

## 2024-03-06 MED ORDER — ESCITALOPRAM OXALATE 10 MG PO TABS
10.0000 mg | ORAL_TABLET | Freq: Every day | ORAL | 2 refills | Status: DC
  Filled 2024-03-06: qty 30, 30d supply, fill #0

## 2024-03-06 MED ORDER — CYANOCOBALAMIN 1000 MCG/ML IJ SOLN
1000.0000 ug | Freq: Once | INTRAMUSCULAR | Status: AC
  Administered 2024-03-06: 1000 ug via INTRAMUSCULAR

## 2024-03-06 NOTE — Assessment & Plan Note (Signed)
 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 Patient was started on Lexapro  5 mg.  Patient did not notice any difference with this.  Will increase dose to 10 mg.  Continued on alprazolam  0.25 mg as needed 3 times a day for panic attacks. Usually taking it once a day  and it does not take it every day.  Will give a refill of this with 30 day supply. continued on trazodone  50 mg at bedtime for sleep. Works when she does take it, but is still groggy.   Will re-consult [facility] behavioral health for counseling

## 2024-03-06 NOTE — Assessment & Plan Note (Signed)
 Will give patient another dose today. Has gotten one shot before. Will recheck B12 at next visit.

## 2024-03-06 NOTE — Patient Instructions (Addendum)
 Today we discussed the following medical conditions and plan:   For your anxiety we will increase your Lexapro  to 10 mg daily and I will check back with you in 6 weeks to see how that is working.  I will refill your Xanax  and encourage you to only use it as needed.  Our hope is to transition you off of these medications and also with the help of some counseling sessions to help you with some coping mechanisms.  I will also get another appointment set up for those counseling sessions  I am glad to hear that the Chantix  is helping you quit smoking.  Please continue that and keep up the good work!  I also am glad to hear that your shoulder is doing better.  Continue to use that heat and ice as needed to help with the pain.  If you need physical therapy can let us  know and we can always revisit that if needed  We also discussed getting your flu shot and we were able to give you that today.  Also gave you a B12 shot today.  I would like to check your B12 levels at the next visit and hopefully those will be better.  I will also check in about your colonoscopy referral so we can get that set up for you.   We look forward to seeing you next time. Please call our clinic at [phone] if you have any questions or concerns. The best time to call is Monday-Friday from 9am-4pm, but there is someone available 24/7. If you need medication refills, please notify your pharmacy one week in advance and they will send us  a request.   Thank you for trusting me with your care. Wishing you the best! I would like ot see you back in 6 weeks.  Deslyn Cavenaugh D'Mello, DO  [facility]

## 2024-03-06 NOTE — Assessment & Plan Note (Signed)
 Patient was given physical therapy referral at the last visit.  Patient says that she is doing well, heat and ice work and did not need physical therapy.

## 2024-03-06 NOTE — Addendum Note (Signed)
 Addended by: KEM NA on: 03/06/2024 01:21 PM   Modules accepted: Orders

## 2024-03-06 NOTE — Assessment & Plan Note (Signed)
 Did discuss flu vaccine with the patient today. Was able to give her flu vaccine today Referral was sent at last visit for colonoscopy. Will check in on status of this

## 2024-03-06 NOTE — Assessment & Plan Note (Signed)
 Has been taking Chantix  1 mg twice a day and says that this helps with smoking cessation.  She has only smoked 1 cigarette since starting she reports.  Will continue chantix 

## 2024-03-06 NOTE — Progress Notes (Signed)
   Established Patient Office Visit  Subjective   Patient ID: Melissa Blankenship, female    DOB: 08-03-1980  Age: 43 y.o. MRN: [id number]  Chief Complaint  Patient presents with   Follow-up   Medication Refill    HPI Melissa Blankenship is a 43 year old female with past medical history of asthma, GERD, generalized anxiety disorder that presents today for 1 month follow-up.   ROS Noted in problem based assessment    Objective:     BP 105/72 (BP Location: Right Arm, Patient Position: Sitting, Cuff Size: Small)   Pulse 78   Temp 98.8 F (37.1 C) (Oral)   Ht 5' 3 (1.6 m)   Wt 133 lb 12.8 oz (60.7 kg)   LMP 04/29/2011   SpO2 100%   BMI 23.70 kg/m  BP Readings from Last 3 Encounters:  03/06/24 105/72  01/18/24 116/70  11/30/23 118/78   Wt Readings from Last 3 Encounters:  03/06/24 133 lb 12.8 oz (60.7 kg)  01/18/24 132 lb 3.2 oz (60 kg)  11/30/23 130 lb (59 kg)      Physical Exam Constitution: Alert, not in acute distress, tearful at times  Lungs: No respiratory distress, effort normal Cardiovascular: Regular rate, no murmurs heard  No results found for any visits on 03/06/24.    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 Patient was started on Lexapro  5 mg.  Patient did not notice any difference with this.  Will increase dose to 10 mg.  Continued on alprazolam  0.25 mg as needed 3 times a day for panic attacks. Usually taking it once a day  and it does not take it every day.  Will give a refill of this with 30 day supply. continued on trazodone  50 mg at bedtime for sleep. Works when she does take it, but is still groggy.   Will re-consult [facility] behavioral health for counseling       Relevant Medications    ALPRAZolam  (XANAX ) 0.25 MG tablet   escitalopram  (LEXAPRO ) 10 MG tablet   Health care maintenance (Chronic)   Did discuss flu vaccine with the patient today. Was able to give her flu vaccine today Referral was sent at last visit for colonoscopy. Will check in on status of this       Tobacco use disorder   Has been taking Chantix  1 mg twice a day and says that this helps with smoking cessation.  She has only smoked 1 cigarette since starting she reports.  Will continue chantix        Vitamin B12 deficiency - Primary   Will give patient another dose today. Has gotten one shot before. Will recheck B12 at next visit.       Relevant Medications   cyanocobalamin  (VITAMIN B12) injection 1,000 mcg   Acute pain of right shoulder   Patient was given physical therapy referral at the last visit.  Patient says that she is doing well, heat and ice work and did not need physical therapy.            Return in about 6 weeks (around 04/17/2024) for medication changes, lexapro  .    Melissa Difatta D'Mello, DO

## 2024-03-07 NOTE — Progress Notes (Signed)
 Internal Medicine Clinic Attending  I was physically present during the key portions of the resident provided service and participated in the medical decision making of patient's management care. I reviewed pertinent patient test results.  The assessment, diagnosis, and plan were formulated together and I agree with the documentation in the resident's note.  Dickie La, MD

## 2024-03-09 NOTE — Telephone Encounter (Signed)
 8/25:Left Message - Pt did not answer. Asked her to call back to discuss what dose of lexapro  she takes and to correct the frequency of taking Chantix    8/26:Left Message - Pt did not answer. Asked her to call back to discuss what dose of lexapro  she takes and to correct the frequency of taking Chantix . told her to take 1 mg two times daily instead of once

## 2024-03-25 ENCOUNTER — Ambulatory Visit: Admitting: Licensed Clinical Social Worker

## 2024-03-25 DIAGNOSIS — F411 Generalized anxiety disorder: Secondary | ICD-10-CM

## 2024-03-25 DIAGNOSIS — F410 Panic disorder [episodic paroxysmal anxiety] without agoraphobia: Secondary | ICD-10-CM

## 2024-04-01 ENCOUNTER — Telehealth: Payer: Self-pay | Admitting: *Deleted

## 2024-04-01 NOTE — Telephone Encounter (Signed)
 Mammogram appointment reminder mailed to patient /  appt 04/10/2024 @ 8:00  am at the [facility] [phone].

## 2024-04-02 ENCOUNTER — Ambulatory Visit: Admitting: Licensed Clinical Social Worker

## 2024-04-02 DIAGNOSIS — F411 Generalized anxiety disorder: Secondary | ICD-10-CM

## 2024-04-10 ENCOUNTER — Ambulatory Visit (INDEPENDENT_AMBULATORY_CARE_PROVIDER_SITE_OTHER): Admitting: Licensed Clinical Social Worker

## 2024-04-10 ENCOUNTER — Ambulatory Visit
Admission: RE | Admit: 2024-04-10 | Discharge: 2024-04-10 | Disposition: A | Source: Ambulatory Visit | Attending: Family Medicine | Admitting: Family Medicine

## 2024-04-10 DIAGNOSIS — Z1231 Encounter for screening mammogram for malignant neoplasm of breast: Secondary | ICD-10-CM | POA: Diagnosis not present

## 2024-04-10 DIAGNOSIS — F411 Generalized anxiety disorder: Secondary | ICD-10-CM | POA: Diagnosis not present

## 2024-04-10 DIAGNOSIS — F410 Panic disorder [episodic paroxysmal anxiety] without agoraphobia: Secondary | ICD-10-CM | POA: Diagnosis not present

## 2024-04-10 NOTE — BH Specialist Note (Signed)
 Integrated Behavioral Health via Telemedicine Visit  04/10/2024 NORINA COWPER [id number]  Number of Integrated Behavioral Health Clinician visits: 2 Session Start time: 0915   Session End time: 1015 Total time in minutes: 60    Referring Provider: PCP Patient/Family location: Home [facility] Provider location: Office All persons participating in visit: [facility] and Patient Types of Service: Psychotherapy    I connected with Nanna N Dozal  via  Telephone and verified that I am speaking with the correct person using two identifiers. Discussed confidentiality: Yes    I discussed the limitations of telemedicine and the availability of in person appointments.  Discussed there is a possibility of technology failure and discussed alternative modes of communication if that failure occurs.   I discussed that engaging in this telemedicine visit, they consent to the provision of behavioral healthcare and the services will be billed under their insurance.   Patient and/or legal guardian expressed understanding and consented to Telemedicine visit: Yes    Presenting Concerns: Patient and/or family reports the following symptoms/concerns:  Provided psychoeducation on panic attacks and anxiety. Explained that panic attacks involve a sudden surge of intense fear or discomfort with symptoms such as racing heart, difficulty breathing, sweating, or dizziness. Reviewed that these symptoms, while uncomfortable, are not life-threatening, and that panic attacks are a treatable anxiety response. Discussed the cycle of fear surrounding panic symptoms and the tendency to worry about the next attack.  CBT skills introduced included identifying and challenging catastrophic thoughts, reality-testing fears about panic symptoms, and practicing controlled breathing. Coping skills recommended:  Slow, deep breathing exercises during anxiety spikes  Mindfulness and grounding techniques to focus on the present  moment  Positive self-talk (e.g., "This will pass, I am safe right now")  Keeping a journal to track anxiety triggers and progress  Patient encouraged to practice these coping strategies daily and to notice patterns in symptoms for ongoing management of anxiety and panic.   Patient and/or Family's Strengths/Protective Factors: Social connections   Goals Addressed: Patient will:  Reduce symptoms of: anxiety   Increase knowledge and/or ability of: coping skills    Progress towards Goals: Ongoing       Interventions: Interventions utilized:  Psychoeducation and/or Health Education Standardized Assessments completed:Not needed          Patient and/or Family Response: Patient agreed to ongoing services   Clinical Assessment/Diagnosis   GAD (generalized anxiety disorder) with panic attacks      Assessment: Patient currently experiencing Anxiety.    Patient may benefit from Ongoing therapy.   Plan: Follow up with behavioral health clinician on : Patient will contact office to schedule     I discussed the assessment and treatment plan with the patient and/or parent/guardian. They were provided an opportunity to ask questions and all were answered. They agreed with the plan and demonstrated an understanding of the instructions.   They were advised to call back or seek an in-person evaluation if the symptoms worsen or if the condition fails to improve as anticipated.   Renda Pontes, MSW, LCSW-A She/Her Behavioral Health Clinician [facility]  Internal Medicine Center

## 2024-04-16 ENCOUNTER — Ambulatory Visit (INDEPENDENT_AMBULATORY_CARE_PROVIDER_SITE_OTHER): Admitting: Licensed Clinical Social Worker

## 2024-04-16 DIAGNOSIS — F410 Panic disorder [episodic paroxysmal anxiety] without agoraphobia: Secondary | ICD-10-CM | POA: Diagnosis not present

## 2024-04-16 DIAGNOSIS — F411 Generalized anxiety disorder: Secondary | ICD-10-CM | POA: Diagnosis not present

## 2024-04-17 ENCOUNTER — Ambulatory Visit: Admitting: Student

## 2024-04-25 ENCOUNTER — Ambulatory Visit: Admitting: Student

## 2024-04-29 ENCOUNTER — Other Ambulatory Visit: Payer: Self-pay

## 2024-04-29 ENCOUNTER — Ambulatory Visit (INDEPENDENT_AMBULATORY_CARE_PROVIDER_SITE_OTHER): Admitting: Internal Medicine

## 2024-04-29 VITALS — BP 105/73 | HR 75 | Temp 98.7°F | Ht 63.0 in | Wt 135.2 lb

## 2024-04-29 DIAGNOSIS — E538 Deficiency of other specified B group vitamins: Secondary | ICD-10-CM

## 2024-04-29 DIAGNOSIS — Z79899 Other long term (current) drug therapy: Secondary | ICD-10-CM | POA: Diagnosis not present

## 2024-04-29 DIAGNOSIS — Z87891 Personal history of nicotine dependence: Secondary | ICD-10-CM | POA: Diagnosis not present

## 2024-04-29 DIAGNOSIS — E559 Vitamin D deficiency, unspecified: Secondary | ICD-10-CM

## 2024-04-29 DIAGNOSIS — F411 Generalized anxiety disorder: Secondary | ICD-10-CM | POA: Diagnosis not present

## 2024-04-29 DIAGNOSIS — Z0000 Encounter for general adult medical examination without abnormal findings: Secondary | ICD-10-CM

## 2024-04-29 DIAGNOSIS — Z1211 Encounter for screening for malignant neoplasm of colon: Secondary | ICD-10-CM

## 2024-04-29 DIAGNOSIS — F410 Panic disorder [episodic paroxysmal anxiety] without agoraphobia: Secondary | ICD-10-CM | POA: Diagnosis not present

## 2024-04-29 DIAGNOSIS — F419 Anxiety disorder, unspecified: Secondary | ICD-10-CM

## 2024-04-29 NOTE — Assessment & Plan Note (Signed)
 Start Oral Vit D3 supplement recommend 1000 international units daily

## 2024-04-29 NOTE — Patient Instructions (Signed)
 SABRA

## 2024-04-29 NOTE — Patient Instructions (Signed)
 VISIT SUMMARY: Today, we discussed your ongoing management of anxiety and panic attacks, your concerns about vitamin deficiencies, and your family history of cancer. We reviewed your current holistic approach to managing anxiety and considered future medication options. We also talked about the importance of vitamin D  and B12 supplementation and addressed your general health maintenance, including cancer screening and vaccinations. YOUR PLAN: -ANXIETY AND PANIC ATTACKS: Anxiety and panic attacks are intense feelings of worry and fear that can be overwhelming. You are currently managing these symptoms with yoga, exercise, and counseling. Since Lexapro  was not effective and caused side effects, we documented it as ineffective. If your symptoms worsen, we may consider trying sertraline in the future. -VITAMIN D  DEFICIENCY: Vitamin D  deficiency means your body has lower levels of vitamin D , which is important for bone health. We recommend you start taking over-the-counter vitamin D  supplements to help improve your levels. -VITAMIN B12 DEFICIENCY: Vitamin B12 deficiency means your body has lower levels of vitamin B12, which is important for nerve function and making red blood cells. We recommend you start taking over-the-counter B12 supplements. If your levels remain low, we may consider giving you another B12 injection. -GENERAL HEALTH MAINTENANCE: We discussed the importance of cancer screening and prevention, especially given your family history. You have successfully quit smoking, which is great. We also talked about the HPV and hepatitis B vaccinations for cancer prevention. We will order a referral for a screening colonoscopy and discuss the HPV vaccination at the pharmacy and your hepatitis B vaccination status. INSTRUCTIONS: Please continue with your current holistic approach to managing anxiety. Start taking over-the-counter vitamin D  and B12 supplements as discussed. We will order a referral for your screening  colonoscopy. Additionally, please discuss the HPV vaccination at your pharmacy and check your hepatitis B vaccination status.

## 2024-04-29 NOTE — Assessment & Plan Note (Signed)
 General Health Maintenance Discussed cancer screening and prevention. Family cancer history noted. Smoking cessation achieved. Discussed HPV and hepatitis B vaccinations for cancer prevention. - Order referral for screening colonoscopy. - Discuss HPV vaccination at pharmacy. - Discuss hepatitis B vaccination status.

## 2024-04-29 NOTE — Assessment & Plan Note (Signed)
Start oral B12 supplement

## 2024-04-29 NOTE — Progress Notes (Signed)
 Subjective:  HPI: Chief Complaint  Patient presents with   Follow-up    Routine office visit regarding medication    Discussed the use of AI scribe software for clinical note transcription with the patient, who gave verbal consent to proceed.  History of Present Illness Melissa Blankenship is a 43 year old female who presents for follow-up regarding Lexapro  use and anxiety management.  She has been managing her anxiety through a holistic approach, including yoga, exercise, and counseling. She is reluctant to take medication, noting that Lexapro  made her feel 'more down and slow,' with increased sleepiness, sluggishness, and sadness. These side effects worsened over time, leading to discontinuation of the medication. 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  She uses Xanax  only during panic attacks but prefers not to take it regularly. She also avoids taking Tylenol  unless necessary.  She is concerned about her family history of cancer, noting her mother had lung cancer and a gastronoma, and several relatives have had breast, rectal, and brain cancers. She has considered genetic testing due to worries about the hereditary nature of these conditions.  She quit smoking in August after smoking socially for eight to nine years. She is due for a colonoscopy, having had one previously due to stomach issues.  She reports low vitamin D  and B12 levels, having received a B12 injection previously. She does not currently take supplements for these vitamins. She experiences joint cracking and popping, particularly in her knees and wrists, which she attributes to her physical activity and work demands.  She denies shortness of breath.     Please see Assessment and Plan below for the status of her chronic medical problems.  Objective:  Physical  Exam: Vitals:   04/29/24 1450  BP: 105/73  Pulse: 75  Temp: 98.7 F (37.1 C)  TempSrc: Oral  SpO2: (!) 76%  Weight: 135 lb 3.2 oz (61.3 kg)  Height: 5' 3 (1.6 m)   Body mass index is 23.95 kg/m. Physical Exam Vitals and nursing note reviewed.  Constitutional:      Appearance: Normal appearance.  Cardiovascular:     Rate and Rhythm: Normal rate and regular rhythm.  Pulmonary:     Effort: Pulmonary effort is normal.     Breath sounds: Normal breath sounds.  Neurological:     Mental Status: She is alert.  Psychiatric:        Mood and Affect: Mood normal.        Behavior: Behavior normal.    No results found for any visits on 04/29/24.  The 10-year ASCVD risk score (Arnett DK, et al., 2019) is: 0.6%  Assessment & Plan:  See Encounters Tab for problem based charting. Assessment and Plan Assessment & Plan Anxiety Symptoms exacerbated by caregiving and family health issues. Lexapro  ineffective due to adverse effects. Prefers non-pharmacological management but open to future medication. - Continue yoga, exercise, and counseling. - Documented Lexapro  as ineffective. - Consider sertraline if symptoms worsen.    Colon cancer screening Appears to be average risk (1 cousin with reported colon CA) willing to undergo Colonoscopy Orders:   Ambulatory referral to Gastroenterology  Vitamin B12 deficiency Start oral B12 supplement    Vitamin D  deficiency Start Oral Vit D3 supplement recommend 1000 international units daily      GAD (generalized anxiety disorder) with panic attacks Symptoms exacerbated by caregiving and family health  issues. Lexapro  ineffective due to adverse effects. Prefers non-pharmacological management but open to future medication. - Continue yoga, exercise, and counseling. - Documented Lexapro  as ineffective. - Consider sertraline if symptoms worsen.    Health care maintenance General Health Maintenance Discussed cancer screening and prevention.  Family cancer history noted. Smoking cessation achieved. Discussed HPV and hepatitis B vaccinations for cancer prevention. - Order referral for screening colonoscopy. - Discuss HPV vaccination at pharmacy. - Discuss hepatitis B vaccination status.      Medications Ordered No orders of the defined types were placed in this encounter.  Other Orders Orders Placed This Encounter  Procedures   Ambulatory referral to Gastroenterology    Referral Priority:   Routine    Referral Type:   Consultation    Referral Reason:   Specialty Services Required    Number of Visits Requested:   1   Follow Up: Return in about 1 year (around 04/29/2025).

## 2024-04-29 NOTE — BH Specialist Note (Signed)
 Appointment was rescheduled for 10/30.  Renda Pontes, MSW, LCSW-A She/Her Behavioral Health Clinician [facility]  Internal Medicine Center

## 2024-04-29 NOTE — BH Specialist Note (Signed)
 Integrated Behavioral Health via Telemedicine Visit  04/29/2024 Melissa Blankenship [id number]  Number of Integrated Behavioral Health Clinician visits: 1- Initial Visit  Session Start time: 0930   Session End time: 1000  Total time in minutes: 30    Referring Provider: PCP Patient/Family location: Home [facility] Provider location: Office All persons participating in visit: [facility] and Patient Types of Service: Introduction only  I connected with Melissa Blankenship  via  Telephone and verified that I am speaking with the correct person using two identifiers. Discussed confidentiality: Yes   I discussed the limitations of telemedicine and the availability of in person appointments.  Discussed there is a possibility of technology failure and discussed alternative modes of communication if that failure occurs.  I discussed that engaging in this telemedicine visit, they consent to the provision of behavioral healthcare and the services will be billed under their insurance.  Patient and/or legal guardian expressed understanding and consented to Telemedicine visit: Yes   Presenting Concerns: Patient and/or family reports the following symptoms/concerns: The Licensed Clinical Engineer, Building [employer] (LCSW-A), acting as a Visual Merchandiser [facility]), initiated a session with patient. The [facility] introduced themselves, explained her role, and provided contact information to the patient. Confidentiality and mandated reporting were discussed, and the patient denied any suicidal ideations or intent to harm others. The Integrated Behavioral Health (IBH) approach was reviewed, and a PHQ-9 assessment was completed.    Patient and/or Family's Strengths/Protective Factors: Social connections  Goals Addressed: Patient will:  Reduce symptoms of: anxiety   Increase knowledge and/or ability of: coping skills   Progress towards Goals: Ongoing    Interventions: Interventions utilized:   Psychoeducation and/or Health Education Standardized Assessments completed: PHQ-SADS     01/18/2024    9:54 AM 10/26/2023    2:08 PM 05/02/2023    9:46 AM  PHQ-SADS Last 3 Score only  Total GAD-7 Score   5  PHQ Adolescent Score 0 9 5       Patient and/or Family Response: Patient agreed to ongoing services  Clinical Assessment/Diagnosis  GAD (generalized anxiety disorder) with panic attacks    Assessment: Patient currently experiencing Anxiety.   Patient may benefit from Ongoing therapy.  Plan: Follow up with behavioral health clinician on : Patient will contact office to schedule   I discussed the assessment and treatment plan with the patient and/or parent/guardian. They were provided an opportunity to ask questions and all were answered. They agreed with the plan and demonstrated an understanding of the instructions.   They were advised to call back or seek an in-person evaluation if the symptoms worsen or if the condition fails to improve as anticipated.  Renda Pontes, MSW, LCSW-A She/Her Behavioral Health Clinician [facility]  Internal Medicine Center

## 2024-04-29 NOTE — Assessment & Plan Note (Addendum)
 Symptoms exacerbated by caregiving and family health issues. Lexapro  ineffective due to adverse effects. Prefers non-pharmacological management but open to future medication. - Continue yoga, exercise, and counseling. - Documented Lexapro  as ineffective. - Consider sertraline if symptoms worsen.

## 2024-04-29 NOTE — Assessment & Plan Note (Signed)
 Symptoms exacerbated by caregiving and family health issues. Lexapro  ineffective due to adverse effects. Prefers non-pharmacological management but open to future medication. - Continue yoga, exercise, and counseling. - Documented Lexapro  as ineffective. - Consider sertraline if symptoms worsen.

## 2024-04-30 NOTE — BH Specialist Note (Unsigned)
 Integrated Behavioral Health via Telemedicine Visit  04/30/2024 ARNELLE NALE [id number]  Number of Integrated Behavioral Health Clinician visits: Additional Visit  Session Start time: 1530   Session End time: 1630  Total time in minutes: 60    Referring Provider: *** Patient/Family location: [facility] Provider location: *** All persons participating in visit: *** Types of Service: {CHL AMB TYPE OF SERVICE:[phone]}  I connected with Nyaira N Nakata and/or Monique N Goebel's {family members:20773} via  Telephone or Engineer, Civil (consulting)  (Video is Surveyor, mining) and verified that I am speaking with the correct person using two identifiers. Discussed confidentiality: {YES/NO:21197}  I discussed the limitations of telemedicine and the availability of in person appointments.  Discussed there is a possibility of technology failure and discussed alternative modes of communication if that failure occurs.  I discussed that engaging in this telemedicine visit, they consent to the provision of behavioral healthcare and the services will be billed under their insurance.  Patient and/or legal guardian expressed understanding and consented to Telemedicine visit: {YES/NO:21197}  Presenting Concerns: Patient and/or family reports the following symptoms/concerns: *** Duration of problem: ***; Severity of problem: {Mild/Moderate/Severe:20260}  Patient and/or Family's Strengths/Protective Factors: {CHL AMB BH PROTECTIVE FACTORS:[phone]}  Goals Addressed: Patient will:  Reduce symptoms of: {IBH Symptoms:21014056}   Increase knowledge and/or ability of: {IBH Patient Tools:21014057}   Demonstrate ability to: {IBH Goals:21014053}  Progress towards Goals: {CHL AMB BH PROGRESS TOWARDS GOALS:[phone]}    Interventions: Interventions utilized:  {IBH Interventions:21014054} Standardized Assessments completed: {IBH Screening  Tools:21014051}    Patient and/or Family Response: ***  Clinical Assessment/Diagnosis  No diagnosis found.    Assessment: Patient currently experiencing ***.   Patient may benefit from ***.  Plan: Follow up with behavioral health clinician on : *** Behavioral recommendations: *** Referral(s): {IBH Referrals:21014055}  I discussed the assessment and treatment plan with the patient and/or parent/guardian. They were provided an opportunity to ask questions and all were answered. They agreed with the plan and demonstrated an understanding of the instructions.   They were advised to call back or seek an in-person evaluation if the symptoms worsen or if the condition fails to improve as anticipated.  Melissa Blankenship

## 2024-05-01 NOTE — Patient Instructions (Signed)
 SABRA

## 2024-05-01 NOTE — Patient Instructions (Signed)
 Melissa Blankenship

## 2024-07-07 ENCOUNTER — Ambulatory Visit: Admitting: Student

## 2024-07-07 ENCOUNTER — Ambulatory Visit: Payer: Self-pay | Admitting: Student

## 2024-07-10 ENCOUNTER — Ambulatory Visit: Payer: Self-pay | Admitting: Student

## 2024-07-11 ENCOUNTER — Encounter: Payer: Self-pay | Admitting: Pediatrics

## 2024-07-21 ENCOUNTER — Ambulatory Visit: Admitting: Student

## 2024-08-11 ENCOUNTER — Encounter

## 2024-08-25 ENCOUNTER — Encounter: Admitting: Pediatrics
